# Patient Record
Sex: Female | Born: 1975 | Race: White | Hispanic: No | Smoking: Never smoker
Health system: Southern US, Community
[De-identification: ages and names within clinical notes are randomized; demographics above are authoritative.]

## PROBLEM LIST (undated history)

## (undated) ENCOUNTER — Ambulatory Visit: Payer: Medicaid Other

## (undated) ENCOUNTER — Emergency Department (HOSPITAL_COMMUNITY): Payer: Medicaid Other

## (undated) DIAGNOSIS — M1990 Unspecified osteoarthritis, unspecified site: Secondary | ICD-10-CM

## (undated) DIAGNOSIS — K760 Fatty (change of) liver, not elsewhere classified: Secondary | ICD-10-CM

## (undated) DIAGNOSIS — K219 Gastro-esophageal reflux disease without esophagitis: Secondary | ICD-10-CM

## (undated) DIAGNOSIS — I639 Cerebral infarction, unspecified: Secondary | ICD-10-CM

## (undated) DIAGNOSIS — F32A Depression, unspecified: Secondary | ICD-10-CM

## (undated) DIAGNOSIS — I2699 Other pulmonary embolism without acute cor pulmonale: Secondary | ICD-10-CM

## (undated) DIAGNOSIS — D649 Anemia, unspecified: Secondary | ICD-10-CM

## (undated) DIAGNOSIS — F419 Anxiety disorder, unspecified: Secondary | ICD-10-CM

## (undated) DIAGNOSIS — I10 Essential (primary) hypertension: Secondary | ICD-10-CM

## (undated) DIAGNOSIS — R519 Headache, unspecified: Secondary | ICD-10-CM

## (undated) DIAGNOSIS — Z87442 Personal history of urinary calculi: Secondary | ICD-10-CM

## (undated) HISTORY — PX: INCISION AND DRAINAGE: SHX5863

## (undated) HISTORY — PX: TONSILLECTOMY: SUR1361

## (undated) HISTORY — PX: CHOLECYSTECTOMY: SHX55

## (undated) SURGERY — Surgical Case
Anesthesia: *Unknown

---

## 1996-04-03 HISTORY — PX: CHOLECYSTECTOMY: SHX55

## 2009-03-11 ENCOUNTER — Emergency Department (HOSPITAL_COMMUNITY): Admission: EM | Admit: 2009-03-11 | Discharge: 2009-03-11 | Payer: Self-pay | Admitting: Emergency Medicine

## 2009-05-03 ENCOUNTER — Emergency Department (HOSPITAL_COMMUNITY): Admission: EM | Admit: 2009-05-03 | Discharge: 2009-05-03 | Payer: Self-pay | Admitting: Emergency Medicine

## 2009-07-23 ENCOUNTER — Emergency Department (HOSPITAL_COMMUNITY): Admission: EM | Admit: 2009-07-23 | Discharge: 2009-07-23 | Payer: Self-pay | Admitting: Emergency Medicine

## 2009-09-02 ENCOUNTER — Emergency Department (HOSPITAL_COMMUNITY): Admission: EM | Admit: 2009-09-02 | Discharge: 2009-09-02 | Payer: Self-pay | Admitting: Emergency Medicine

## 2009-10-28 ENCOUNTER — Emergency Department (HOSPITAL_COMMUNITY): Admission: EM | Admit: 2009-10-28 | Discharge: 2009-10-29 | Payer: Self-pay | Admitting: Emergency Medicine

## 2009-11-21 ENCOUNTER — Emergency Department (HOSPITAL_COMMUNITY): Admission: EM | Admit: 2009-11-21 | Discharge: 2009-11-21 | Payer: Self-pay | Admitting: Emergency Medicine

## 2009-12-02 ENCOUNTER — Emergency Department (HOSPITAL_COMMUNITY): Admission: EM | Admit: 2009-12-02 | Discharge: 2009-12-02 | Payer: Self-pay | Admitting: Emergency Medicine

## 2010-01-24 ENCOUNTER — Emergency Department (HOSPITAL_COMMUNITY): Admission: EM | Admit: 2010-01-24 | Discharge: 2010-01-24 | Payer: Self-pay | Admitting: Emergency Medicine

## 2010-02-13 ENCOUNTER — Emergency Department (HOSPITAL_COMMUNITY): Admission: EM | Admit: 2010-02-13 | Discharge: 2010-02-13 | Payer: Self-pay | Admitting: Emergency Medicine

## 2010-03-25 ENCOUNTER — Emergency Department (HOSPITAL_COMMUNITY)
Admission: EM | Admit: 2010-03-25 | Discharge: 2010-03-25 | Payer: Self-pay | Source: Home / Self Care | Admitting: Emergency Medicine

## 2010-06-13 LAB — DIFFERENTIAL
Basophils Absolute: 0 K/uL (ref 0.0–0.1)
Basophils Relative: 0 % (ref 0–1)
Eosinophils Absolute: 0.3 K/uL (ref 0.0–0.7)
Eosinophils Relative: 4 % (ref 0–5)
Lymphocytes Relative: 31 % (ref 12–46)
Lymphs Abs: 2.4 K/uL (ref 0.7–4.0)
Monocytes Absolute: 0.7 K/uL (ref 0.1–1.0)
Monocytes Relative: 8 % (ref 3–12)
Neutro Abs: 4.5 K/uL (ref 1.7–7.7)
Neutrophils Relative %: 57 % (ref 43–77)

## 2010-06-13 LAB — CBC
HCT: 37.8 % (ref 36.0–46.0)
MCV: 86.7 fL (ref 78.0–100.0)
RDW: 13.1 % (ref 11.5–15.5)
WBC: 7.9 10*3/uL (ref 4.0–10.5)

## 2010-06-13 LAB — BASIC METABOLIC PANEL
BUN: 10 mg/dL (ref 6–23)
Chloride: 103 mEq/L (ref 96–112)
Potassium: 3.6 mEq/L (ref 3.5–5.1)

## 2010-06-13 LAB — POCT CARDIAC MARKERS
CKMB, poc: 2.4 ng/mL (ref 1.0–8.0)
CKMB, poc: 5.2 ng/mL (ref 1.0–8.0)
Myoglobin, poc: 80.1 ng/mL (ref 12–200)
Troponin i, poc: <0.05 ng/mL (ref 0.00–0.09)
Troponin i, poc: <0.05 ng/mL (ref 0.00–0.09)

## 2010-06-13 LAB — D-DIMER, QUANTITATIVE: D-Dimer, Quant: 0.35 ug{FEU}/mL (ref 0.00–0.48)

## 2010-06-18 LAB — URINE MICROSCOPIC-ADD ON

## 2010-06-18 LAB — URINALYSIS, ROUTINE W REFLEX MICROSCOPIC
Bilirubin Urine: NEGATIVE
Glucose, UA: NEGATIVE mg/dL
Ketones, ur: NEGATIVE mg/dL
pH: 6 (ref 5.0–8.0)

## 2010-06-20 LAB — URINALYSIS, ROUTINE W REFLEX MICROSCOPIC
Bilirubin Urine: NEGATIVE
Glucose, UA: NEGATIVE mg/dL
Hgb urine dipstick: NEGATIVE
Ketones, ur: NEGATIVE mg/dL
Protein, ur: NEGATIVE mg/dL
Urobilinogen, UA: 0.2 mg/dL (ref 0.0–1.0)

## 2010-06-20 LAB — POCT I-STAT, CHEM 8
BUN: 14 mg/dL (ref 6–23)
Chloride: 104 mEq/L (ref 96–112)
HCT: 37 % (ref 36.0–46.0)
Sodium: 141 mEq/L (ref 135–145)
TCO2: 30 mmol/L (ref 0–100)

## 2010-06-20 LAB — COMPREHENSIVE METABOLIC PANEL
ALT: 20 U/L (ref 0–35)
AST: 21 U/L (ref 0–37)
Alkaline Phosphatase: 85 U/L (ref 39–117)
GFR calc Af Amer: >60 mL/min (ref >60)
Glucose, Bld: 93 mg/dL (ref 70–99)
Potassium: 3.6 mEq/L (ref 3.5–5.1)
Sodium: 139 mEq/L (ref 135–145)
Total Protein: 6.9 g/dL (ref 6.0–8.3)

## 2010-06-20 LAB — CBC
Hemoglobin: 12.6 g/dL (ref 12.0–15.0)
RBC: 4.24 MIL/uL (ref 3.87–5.11)
RDW: 14.4 % (ref 11.5–15.5)

## 2010-06-20 LAB — DIFFERENTIAL
Basophils Relative: 1 % (ref 0–1)
Eosinophils Absolute: 0.6 10*3/uL (ref 0.0–0.7)
Eosinophils Relative: 7 % — ABNORMAL HIGH (ref 0–5)
Lymphs Abs: 2.1 10*3/uL (ref 0.7–4.0)
Monocytes Absolute: 0.6 10*3/uL (ref 0.1–1.0)
Monocytes Relative: 8 % (ref 3–12)
Neutrophils Relative %: 56 % (ref 43–77)

## 2010-06-20 LAB — BRAIN NATRIURETIC PEPTIDE: Pro B Natriuretic peptide (BNP): <30 pg/mL (ref 0.0–100.0)

## 2010-06-20 LAB — POCT PREGNANCY, URINE: Preg Test, Ur: NEGATIVE

## 2013-03-19 ENCOUNTER — Emergency Department (HOSPITAL_COMMUNITY): Payer: Medicaid Other

## 2013-03-19 ENCOUNTER — Encounter (HOSPITAL_COMMUNITY): Payer: Self-pay | Admitting: Emergency Medicine

## 2013-03-19 ENCOUNTER — Emergency Department (HOSPITAL_COMMUNITY)
Admission: EM | Admit: 2013-03-19 | Discharge: 2013-03-19 | Disposition: A | Discharge status: Home / Self Care | Payer: Medicaid Other | Attending: Emergency Medicine | Admitting: Emergency Medicine

## 2013-03-19 DIAGNOSIS — N390 Urinary tract infection, site not specified: Secondary | ICD-10-CM | POA: Insufficient documentation

## 2013-03-19 DIAGNOSIS — R209 Unspecified disturbances of skin sensation: Secondary | ICD-10-CM | POA: Insufficient documentation

## 2013-03-19 DIAGNOSIS — Z9089 Acquired absence of other organs: Secondary | ICD-10-CM | POA: Insufficient documentation

## 2013-03-19 DIAGNOSIS — R1032 Left lower quadrant pain: Secondary | ICD-10-CM | POA: Insufficient documentation

## 2013-03-19 DIAGNOSIS — R197 Diarrhea, unspecified: Secondary | ICD-10-CM

## 2013-03-19 DIAGNOSIS — Z9889 Other specified postprocedural states: Secondary | ICD-10-CM | POA: Insufficient documentation

## 2013-03-19 DIAGNOSIS — I10 Essential (primary) hypertension: Secondary | ICD-10-CM | POA: Insufficient documentation

## 2013-03-19 DIAGNOSIS — Z86711 Personal history of pulmonary embolism: Secondary | ICD-10-CM | POA: Insufficient documentation

## 2013-03-19 DIAGNOSIS — R110 Nausea: Secondary | ICD-10-CM | POA: Insufficient documentation

## 2013-03-19 DIAGNOSIS — M549 Dorsalgia, unspecified: Secondary | ICD-10-CM | POA: Insufficient documentation

## 2013-03-19 DIAGNOSIS — R109 Unspecified abdominal pain: Secondary | ICD-10-CM

## 2013-03-19 HISTORY — DX: Other pulmonary embolism without acute cor pulmonale: I26.99

## 2013-03-19 HISTORY — DX: Essential (primary) hypertension: I10

## 2013-03-19 LAB — COMPREHENSIVE METABOLIC PANEL
ALT: 15 U/L (ref 0–35)
Albumin: 3.2 g/dL — ABNORMAL LOW (ref 3.5–5.2)
Alkaline Phosphatase: 94 U/L (ref 39–117)
BUN: 11 mg/dL (ref 6–23)
Calcium: 8.9 mg/dL (ref 8.4–10.5)
GFR calc Af Amer: 83 mL/min — ABNORMAL LOW (ref >90)
GFR calc non Af Amer: 72 mL/min — ABNORMAL LOW (ref >90)
Glucose, Bld: 82 mg/dL (ref 70–99)
Potassium: 3.6 mEq/L (ref 3.5–5.1)
Sodium: 138 mEq/L (ref 135–145)
Total Protein: 6.7 g/dL (ref 6.0–8.3)

## 2013-03-19 LAB — URINE MICROSCOPIC-ADD ON

## 2013-03-19 LAB — CBC WITH DIFFERENTIAL/PLATELET
Basophils Relative: 1 % (ref 0–1)
Eosinophils Absolute: 0.3 10*3/uL (ref 0.0–0.7)
Eosinophils Relative: 6 % — ABNORMAL HIGH (ref 0–5)
Hemoglobin: 12.8 g/dL (ref 12.0–15.0)
MCH: 28.4 pg (ref 26.0–34.0)
MCHC: 33.5 g/dL (ref 30.0–36.0)
MCV: 84.9 fL (ref 78.0–100.0)
Monocytes Relative: 9 % (ref 3–12)
Neutrophils Relative %: 58 % (ref 43–77)
Platelets: 259 10*3/uL (ref 150–400)

## 2013-03-19 LAB — LIPASE, BLOOD: Lipase: 24 U/L (ref 11–59)

## 2013-03-19 LAB — URINALYSIS, ROUTINE W REFLEX MICROSCOPIC
Bilirubin Urine: NEGATIVE
Ketones, ur: NEGATIVE mg/dL
Leukocytes, UA: NEGATIVE
Nitrite: POSITIVE — AB
Protein, ur: NEGATIVE mg/dL

## 2013-03-19 LAB — TROPONIN I: Troponin I: <0.3 ng/mL (ref <0.30)

## 2013-03-19 MED ORDER — SODIUM CHLORIDE 0.9 % IV BOLUS (SEPSIS)
1000.0000 mL | Freq: Once | INTRAVENOUS | Status: AC
  Administered 2013-03-19: 1000 mL via INTRAVENOUS

## 2013-03-19 MED ORDER — IOHEXOL 300 MG/ML  SOLN
50.0000 mL | Freq: Once | INTRAMUSCULAR | Status: AC | PRN
  Administered 2013-03-19: 50 mL via ORAL

## 2013-03-19 MED ORDER — SULFAMETHOXAZOLE-TRIMETHOPRIM 800-160 MG PO TABS: 1.0000 | ORAL_TABLET | Freq: Two times a day (BID) | ORAL | Status: AC

## 2013-03-19 MED ORDER — PIPERACILLIN-TAZOBACTAM 3.375 G IVPB
3.3750 g | Freq: Once | INTRAVENOUS | Status: AC
  Administered 2013-03-19: 3.375 g via INTRAVENOUS
  Filled 2013-03-19: qty 50

## 2013-03-19 MED ORDER — LORAZEPAM 2 MG/ML IJ SOLN
1.0000 mg | Freq: Once | INTRAMUSCULAR | Status: AC
  Administered 2013-03-19: 1 mg via INTRAVENOUS
  Filled 2013-03-19: qty 1

## 2013-03-19 MED ORDER — IOHEXOL 300 MG/ML  SOLN
100.0000 mL | Freq: Once | INTRAMUSCULAR | Status: AC | PRN
  Administered 2013-03-19: 100 mL via INTRAVENOUS

## 2013-03-19 MED ORDER — MORPHINE SULFATE 4 MG/ML IJ SOLN
4.0000 mg | Freq: Once | INTRAMUSCULAR | Status: AC
  Administered 2013-03-19: 4 mg via INTRAVENOUS
  Filled 2013-03-19: qty 1

## 2013-03-19 MED ORDER — ONDANSETRON HCL 4 MG/2ML IJ SOLN
4.0000 mg | Freq: Once | INTRAMUSCULAR | Status: AC
  Administered 2013-03-19: 4 mg via INTRAVENOUS
  Filled 2013-03-19: qty 2

## 2013-03-19 MED ORDER — OXYCODONE-ACETAMINOPHEN 5-325 MG PO TABS: 1.0000 | ORAL_TABLET | ORAL | Status: DC | PRN

## 2013-03-19 MED ORDER — SODIUM CHLORIDE 0.9 % IJ SOLN
INTRAMUSCULAR | Status: AC
  Filled 2013-03-19: qty 400

## 2013-03-19 MED ORDER — ONDANSETRON HCL 4 MG PO TABS: 4.0000 mg | ORAL_TABLET | Freq: Four times a day (QID) | ORAL | Status: DC

## 2013-03-19 NOTE — ED Provider Notes (Signed)
TIME SEEN: 12:55 PM  CHIEF COMPLAINT: back pain, diarrhea  HPI: Tracie Green is a 37 y.o. F with h/o HTN and prior PE who presents to the ED with complaints. She states she has had gradual onset left sided back pain that radiates to her left leg that began 1 week ago. She describes that back pain as "feeling like someone is hitting me in my back." Tracie Green reports associated numbness and tingling in her left leg. No weakness. No bowel or bladder incontinence. No urinary retention. She reports walking worsens the back pain. Tracie Green has not taken any medication for the back pain.   Tracie Green also complains of generalized stabbing abdominal pain in the left lower quadrant that has been present for one month. Tracie Green reports abdominal pain is worsened with eating. She experiences associated diarrhea and nausea. Tracie Green denies melena, bloody stools, sure your hematuria, fever, chills, vomiting. She has h/o 4 cesarean sections and cholecystectomy. Tracie Green denies sick contacts, travel, recent antibiotic use or hospitalization.  LNMP 03/12/13. Denies a history of similar symptoms.  ROS: See HPI Constitutional: no fever, no chills  Eyes: no drainage  ENT: no runny nose   Cardiovascular:  no chest pain  Resp: no SOB  GI: no vomiting, positive nausea, positive abdominal pain, positive diarrhea GU: no dysuria, no urinary incontinence Integumentary: no rash  Allergy: no hives  Musculoskeletal: no leg swelling, positive back pain, positive left shoulder and leg pain  Neurological: no slurred speech ROS otherwise negative  PAST MEDICAL HISTORY/PAST SURGICAL HISTORY:  Past Medical History  Diagnosis Date  . PE (pulmonary embolism)   . Hypertension     MEDICATIONS:  Prior to Admission medications   Not on File    ALLERGIES:  No Known Allergies  SOCIAL HISTORY:  History  Substance Use Topics  . Smoking status: Never Smoker   . Smokeless tobacco: Not on file  . Alcohol Use: No    FAMILY HISTORY: No family history on  file.  EXAM: BP 134/95  Pulse 87  Temp(Src) 98.6 F (37 C) (Oral)  Resp 18  Ht 5\' 9"  (1.753 m)  Wt 282 lb (127.914 kg)  BMI 41.63 kg/m2  SpO2 100%  LMP 03/12/2013 CONSTITUTIONAL: Alert and oriented and responds appropriately to questions. Well-appearing; well-nourished; obese HEAD: Normocephalic EYES: Conjunctivae clear, PERRL ENT: normal nose; no rhinorrhea; moist mucous membranes; pharynx without lesions noted NECK: Supple, no meningismus, no LAD  CARD: RRR; S1 and S2 appreciated; no murmurs, no clicks, no rubs, no gallops RESP: Normal chest excursion without splinting or tachypnea; breath sounds clear and equal bilaterally; no wheezes, no rhonchi, no rales,  ABD/GI: Normal bowel sounds; non-distended; no rebound, no guarding, tender to palpation in LLQ bilaterally, no peritoneal signs.  BACK:  The back appears normal, there is no CVA tenderness, diffuse lumbar tenderness, no midline tenderness and no step-off or deformity  EXT: Normal ROM in all joints; non-tender to palpation; no edema; normal capillary refill; no cyanosis    SKIN: Normal color for age and race; warm NEURO: Moves all extremities equally, 2+ patellar and achilles pulses bilaterally, 5/5 strength lower extremities bilaterally; no facial droop or slurred speech; sensation to light touch intact diffusely PSYCH: The patient's mood and manner are appropriate. Grooming and personal hygiene are appropriate.  MEDICAL DECISION MAKING: Patient here with 2 complaints. I feel her back pain is likely related to sciatica. No concern for cauda equina, epidural abscess, osteomyelitis, spinal stenosis.  Her neurologic exam is normal. No history of injury to  her back. We'll give pain medication and reassess. Discussed with patient that recommended anti-inflammatories for medical management in an outpatient followup with orthopedics if her back pain does not improve.  As for Tracie Green's LLQ pain, will obtain labs, urine, CT AP to evaluate for  colitis, diverticulitis, UTI as the source of her symptoms.  Will give IVF, morphine, zofran and reassess.  ED PROGRESS: Patient is having diffuse body pain and shortness of breath after given morphine. She states she's had morphine in the past without similar symptoms. Her EKG shows no ischemic changes. Will add on troponin. Will give Ativan and reassess. Patient's labs are unremarkable.  She reports feeling much better after Ativan. She is much more comfortable. Suspect this was an adverse reaction to morphine. Her troponin is negative. CT scan pending.  Patient has nitrite positive urinary tract infection. Cultures are pending. Will give Zosyn. CT scan still pending. Patient's had difficulty keeping contrast down due to her nausea and vomiting.     CT scan shows a small nonobstructive right renal calculus but no other significant abnormality. Have discussed with patient that have not been an emergent or life-threatening cause for her abdominal pain has been present for one month. Have recommended that she establish care with her primary care physician he may referred her to a gastroenterologist and she continues to have left-sided abdominal pain and diarrhea. Given return precautions. We'll discharge him with antibiotics for her UTI. We'll also discharge with pain medication for her back pain. Patient verbalizes understanding and is comfortable with plan.  EKG Interpretation    Date/Time:  Wednesday March 19 2013 14:06:10 EST Ventricular Rate:  79 PR Interval:  134 QRS Duration: 82 QT Interval:  386 QTC Calculation: 442 R Axis:   54 Text Interpretation:  Normal sinus rhythm Normal ECG Confirmed by Nonna Renninger  DO, Athaliah Baumbach ([id number]) on 03/19/2013 2:51:24 PM             Layla Maw Shirell Struthers, DO 03/19/13 1610

## 2013-03-19 NOTE — ED Notes (Signed)
Pt c/o pain in left lower back radiating down left leg x 1 week.  Also reports has had diarrhea "non-stop" for the past week.  C/O left sided abd pain.

## 2013-03-22 LAB — URINE CULTURE: Colony Count: >=100000

## 2013-03-23 ENCOUNTER — Telehealth (HOSPITAL_COMMUNITY): Payer: Self-pay | Admitting: Emergency Medicine

## 2013-03-23 NOTE — ED Notes (Signed)
Post ED Visit - Positive Culture Follow-up  Culture report reviewed by antimicrobial stewardship pharmacist: []  Wes Dulaney, Pharm.D., BCPS [x]  Celedonio Miyamoto, [address].D., BCPS []  Georgina Pillion, Pharm.D., BCPS []  [place], [place].D., BCPS, AAHIVP []  Estella Husk, Pharm.D., BCPS, AAHIVP  Positive urine culture Treated with Sulfa-Trimeth, organism sensitive to the same and no further patient follow-up is required at this time.  Kylie A Holland 03/23/2013, 12:25 PM

## 2013-07-16 ENCOUNTER — Emergency Department (HOSPITAL_COMMUNITY): Payer: Medicaid Other

## 2013-07-16 ENCOUNTER — Emergency Department (HOSPITAL_COMMUNITY)
Admission: EM | Admit: 2013-07-16 | Discharge: 2013-07-16 | Disposition: A | Discharge status: Home / Self Care | Payer: Medicaid Other | Attending: Emergency Medicine | Admitting: Emergency Medicine

## 2013-07-16 ENCOUNTER — Encounter (HOSPITAL_COMMUNITY): Payer: Self-pay | Admitting: Emergency Medicine

## 2013-07-16 DIAGNOSIS — Z79899 Other long term (current) drug therapy: Secondary | ICD-10-CM | POA: Diagnosis not present

## 2013-07-16 DIAGNOSIS — R296 Repeated falls: Secondary | ICD-10-CM | POA: Diagnosis not present

## 2013-07-16 DIAGNOSIS — S99919A Unspecified injury of unspecified ankle, initial encounter: Secondary | ICD-10-CM | POA: Diagnosis present

## 2013-07-16 DIAGNOSIS — S86819A Strain of other muscle(s) and tendon(s) at lower leg level, unspecified leg, initial encounter: Principal | ICD-10-CM

## 2013-07-16 DIAGNOSIS — Z86711 Personal history of pulmonary embolism: Secondary | ICD-10-CM | POA: Diagnosis not present

## 2013-07-16 DIAGNOSIS — S86912A Strain of unspecified muscle(s) and tendon(s) at lower leg level, left leg, initial encounter: Secondary | ICD-10-CM

## 2013-07-16 DIAGNOSIS — S838X9A Sprain of other specified parts of unspecified knee, initial encounter: Secondary | ICD-10-CM | POA: Insufficient documentation

## 2013-07-16 DIAGNOSIS — S93402A Sprain of unspecified ligament of left ankle, initial encounter: Secondary | ICD-10-CM

## 2013-07-16 DIAGNOSIS — S93409A Sprain of unspecified ligament of unspecified ankle, initial encounter: Secondary | ICD-10-CM | POA: Insufficient documentation

## 2013-07-16 DIAGNOSIS — I10 Essential (primary) hypertension: Secondary | ICD-10-CM | POA: Diagnosis not present

## 2013-07-16 DIAGNOSIS — Y929 Unspecified place or not applicable: Secondary | ICD-10-CM | POA: Insufficient documentation

## 2013-07-16 DIAGNOSIS — S8990XA Unspecified injury of unspecified lower leg, initial encounter: Secondary | ICD-10-CM | POA: Diagnosis present

## 2013-07-16 DIAGNOSIS — Y939 Activity, unspecified: Secondary | ICD-10-CM | POA: Insufficient documentation

## 2013-07-16 MED ORDER — IBUPROFEN 800 MG PO TABS: 800.0000 mg | ORAL_TABLET | Freq: Three times a day (TID) | ORAL | Status: DC

## 2013-07-16 MED ORDER — TRAMADOL HCL 50 MG PO TABS: ORAL_TABLET | ORAL | Status: DC

## 2013-07-16 MED ORDER — TRAMADOL HCL 50 MG PO TABS
100.0000 mg | ORAL_TABLET | Freq: Once | ORAL | Status: AC
  Administered 2013-07-16: 100 mg via ORAL
  Filled 2013-07-16: qty 2

## 2013-07-16 MED ORDER — KETOROLAC TROMETHAMINE 10 MG PO TABS
10.0000 mg | ORAL_TABLET | Freq: Once | ORAL | Status: AC
  Administered 2013-07-16: 10 mg via ORAL
  Filled 2013-07-16: qty 1

## 2013-07-16 MED ORDER — ONDANSETRON HCL 4 MG PO TABS
4.0000 mg | ORAL_TABLET | Freq: Once | ORAL | Status: AC
  Administered 2013-07-16: 4 mg via ORAL
  Filled 2013-07-16: qty 1

## 2013-07-16 NOTE — Discharge Instructions (Signed)
Your x-ray is negative for fracture or dislocation. Please apply ice to your knee and ankle. Please use medications as directed. Please see Dr. Hilda LiasKeeling, or the orthopedic specialist of your choice for evaluation of your knee, and the reason for recurrent falls. Please use the crutches until you can safely put weight on the left side.  Knee Sprain A knee sprain is a tear in the strong bands of tissue that connect the bones (ligaments) of your knee. HOME CARE  Raise (elevate) your injured knee to lessen puffiness (swelling).  To ease pain and puffiness, put ice on the injured area.  Put ice in a plastic bag.  Place a towel between your skin and the bag.  Leave the ice on for 20 minutes, 2 3 times a day.  Only take medicine as told by your doctor.  Do not leave your knee unprotected until pain and stiffness go away (usually 4 6 weeks).  If you have a cast or splint, do not get it wet. If your doctor told you to not take it off, cover it with a plastic bag when you shower or bathe. Do not swim.  Your doctor may have you do exercises to prevent or limit permanent weakness and stiffness. GET HELP RIGHT AWAY IF:   Your cast or splint becomes damaged.  Your pain gets worse.  You have a lot of pain, puffiness, or numbness below the cast or splint. MAKE SURE YOU:   Understand these instructions.  Will watch your condition.  Will get help right away if you are not doing well or get worse. Document Released: 03/08/2009 Document Revised: 01/08/2013 Document Reviewed: 11/26/2012 [facility] Patient Information 2014 [place], [place].  Ankle Sprain An ankle sprain is an injury to the strong, fibrous tissues (ligaments) that hold your ankle bones together.  HOME CARE   Put ice on your ankle for 1 2 days or as told by your doctor.  Put ice in a plastic bag.  Place a towel between your skin and the bag.  Leave the ice on for 15-20 minutes at a time, every 2 hours while you are  awake.  Only take medicine as told by your doctor.  Raise (elevate) your injured ankle above the level of your heart as much as possible for 2 3 days.  Use crutches if your doctor tells you to. Slowly put your own weight on the affected ankle. Use the crutches until you can walk without pain.  If you have a plaster splint:  Do not rest it on anything harder than a pillow for 24 hours.  Do not put weight on it.  Do not get it wet.  Take it off to shower or bathe.  If given, use an elastic wrap or support stocking for support. Take the wrap off if your toes lose feeling (numb), tingle, or turn cold or blue.  If you have an air splint:  Add or let out air to make it comfortable.  Take it off at night and to shower and bathe.  Wiggle your toes and move your ankle up and down often while you are wearing it. GET HELP RIGHT AWAY IF:   Your toes lose feeling (numb) or turn blue.  You have severe pain that is increasing.  You have rapidly increasing bruising or puffiness (swelling).  Your toes feel very cold.  You lose feeling in your foot.  Your medicine does not help your pain. MAKE SURE YOU:   Understand these instructions.  Will  watch your condition.  Will get help right away if you are not doing well or get worse. Document Released: 09/06/2007 Document Revised: 12/13/2011 Document Reviewed: 10/02/2011 [facility] Patient Information 2014 [place], [place].

## 2013-07-16 NOTE — ED Notes (Signed)
Pt states lt leg "gave out on me" last night and she fell backward on top of lt leg. Co lt knee/leg pain, able to bear weight but states it is painful.

## 2013-07-16 NOTE — ED Provider Notes (Signed)
CSN: [id number]     Arrival date & time 07/16/13  [id number] History   First MD Initiated Contact with Patient 07/16/13 1034     Chief Complaint  Patient presents with  . Fall     (Consider location/radiation/quality/duration/timing/severity/associated sxs/prior Treatment) HPI Comments: Pt states she sustained a fall about 2 weeks ago, but did not go to see MD or ED. She fell again on last night and injured the left knee and leg. She can bear weight, but with discomfort. She was told to have knee surgery after an accident 2002, but has not had anything done about the knee.  Patient is a 38 y.o. female presenting with fall. The history is provided by the patient.  Fall Associated symptoms include arthralgias. Pertinent negatives include no abdominal pain, chest pain, coughing or neck pain.    Past Medical History  Diagnosis Date  . PE (pulmonary embolism)   . Hypertension    Past Surgical History  Procedure Laterality Date  . Incision and drainage    . Cholecystectomy    . Cesarean section     History reviewed. No pertinent family history. History  Substance Use Topics  . Smoking status: Never Smoker   . Smokeless tobacco: Not on file  . Alcohol Use: No   OB History   Grav Para Term Preterm Abortions TAB SAB Ect Mult Living                 Review of Systems  Constitutional: Negative for activity change.       All ROS Neg except as noted in HPI  HENT: Negative for nosebleeds.   Eyes: Negative for photophobia and discharge.  Respiratory: Negative for cough, shortness of breath and wheezing.   Cardiovascular: Negative for chest pain and palpitations.  Gastrointestinal: Negative for abdominal pain and blood in stool.  Genitourinary: Negative for dysuria, frequency and hematuria.  Musculoskeletal: Positive for arthralgias. Negative for back pain and neck pain.  Skin: Negative.   Neurological: Negative for dizziness, seizures and speech difficulty.  Psychiatric/Behavioral:  Negative for hallucinations and confusion.      Allergies  Review of patient's allergies indicates no known allergies.  Home Medications   Prior to Admission medications   Medication Sig Start Date End Date Taking? Authorizing Provider  escitalopram (LEXAPRO) 5 MG tablet Take 5 mg by mouth daily.   Yes Historical Provider, MD  lisinopril-hydrochlorothiazide (PRINZIDE,ZESTORETIC) 20-12.5 MG per tablet Take 1 tablet by mouth daily.   Yes Historical Provider, MD   BP 136/66  Pulse 75  Temp(Src) 98.3 F (36.8 C) (Oral)  Resp 20  Ht 5\' 9"  (1.753 m)  Wt 292 lb (132.45 kg)  BMI 43.10 kg/m2  LMP 06/26/2013 Physical Exam  Nursing note and vitals reviewed. Constitutional: She is oriented to person, place, and time. She appears well-developed and well-nourished.  Non-toxic appearance.  HENT:  Head: Normocephalic.  Right Ear: Tympanic membrane and external ear normal.  Left Ear: Tympanic membrane and external ear normal.  Eyes: EOM and lids are normal. Pupils are equal, round, and reactive to light.  Neck: Normal range of motion. Neck supple. Carotid bruit is not present.  Cardiovascular: Normal rate, regular rhythm, normal heart sounds, intact distal pulses and normal pulses.   Pulmonary/Chest: Breath sounds normal. No respiratory distress.  Abdominal: Soft. Bowel sounds are normal. There is no tenderness. There is no guarding.  Musculoskeletal:       Left knee: She exhibits decreased range of motion. She exhibits no swelling,  no effusion and no erythema. Tenderness found.  Crepitus with ROM.  Lymphadenopathy:       Head (right side): No submandibular adenopathy present.       Head (left side): No submandibular adenopathy present.    She has no cervical adenopathy.  Neurological: She is alert and oriented to person, place, and time. She has normal strength. No cranial nerve deficit or sensory deficit.  Skin: Skin is warm and dry.  Psychiatric: She has a normal mood and affect. Her  speech is normal.    ED Course  Procedures (including critical care time) Labs Review Labs Reviewed - No data to display  Imaging Review No results found.   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 No effusion noted. No fx or dislocation. Rx for ibuprofen and ultram. Crutches and aso splint given.   Final diagnoses:  None    *I have reviewed nursing notes, vital signs, and all appropriate lab and imaging results for this patient.Kathie Dike**    Austynn Pridmore M Aaniya Sterba, PA-C 07/17/13 [phone]

## 2013-07-21 NOTE — ED Provider Notes (Signed)
Medical screening examination/treatment/procedure(s) were performed by non-physician practitioner and as supervising physician I was immediately available for consultation/collaboration.   EKG Interpretation None       Tracie HutchingBrian Reynard Christoffersen, MD 07/21/13 [id number]

## 2014-01-22 ENCOUNTER — Encounter (HOSPITAL_COMMUNITY): Payer: Self-pay | Admitting: Emergency Medicine

## 2014-01-22 ENCOUNTER — Emergency Department (HOSPITAL_COMMUNITY)
Admission: EM | Admit: 2014-01-22 | Discharge: 2014-01-22 | Disposition: A | Discharge status: Home / Self Care | Payer: Medicaid Other | Attending: Emergency Medicine | Admitting: Emergency Medicine

## 2014-01-22 ENCOUNTER — Emergency Department (HOSPITAL_COMMUNITY): Payer: Medicaid Other

## 2014-01-22 DIAGNOSIS — Z79899 Other long term (current) drug therapy: Secondary | ICD-10-CM | POA: Insufficient documentation

## 2014-01-22 DIAGNOSIS — S8992XA Unspecified injury of left lower leg, initial encounter: Secondary | ICD-10-CM | POA: Diagnosis present

## 2014-01-22 DIAGNOSIS — Y9389 Activity, other specified: Secondary | ICD-10-CM | POA: Insufficient documentation

## 2014-01-22 DIAGNOSIS — Y9289 Other specified places as the place of occurrence of the external cause: Secondary | ICD-10-CM | POA: Insufficient documentation

## 2014-01-22 DIAGNOSIS — S90511A Abrasion, right ankle, initial encounter: Secondary | ICD-10-CM | POA: Insufficient documentation

## 2014-01-22 DIAGNOSIS — I10 Essential (primary) hypertension: Secondary | ICD-10-CM | POA: Diagnosis not present

## 2014-01-22 DIAGNOSIS — Z23 Encounter for immunization: Secondary | ICD-10-CM | POA: Diagnosis not present

## 2014-01-22 DIAGNOSIS — S50311A Abrasion of right elbow, initial encounter: Secondary | ICD-10-CM | POA: Diagnosis not present

## 2014-01-22 DIAGNOSIS — S82892A Other fracture of left lower leg, initial encounter for closed fracture: Secondary | ICD-10-CM

## 2014-01-22 DIAGNOSIS — W010XXA Fall on same level from slipping, tripping and stumbling without subsequent striking against object, initial encounter: Secondary | ICD-10-CM | POA: Diagnosis not present

## 2014-01-22 DIAGNOSIS — S82435A Nondisplaced oblique fracture of shaft of left fibula, initial encounter for closed fracture: Secondary | ICD-10-CM | POA: Insufficient documentation

## 2014-01-22 DIAGNOSIS — Z86711 Personal history of pulmonary embolism: Secondary | ICD-10-CM | POA: Insufficient documentation

## 2014-01-22 MED ORDER — OXYCODONE-ACETAMINOPHEN 5-325 MG PO TABS
2.0000 | ORAL_TABLET | Freq: Once | ORAL | Status: AC
  Administered 2014-01-22: 2 via ORAL
  Filled 2014-01-22: qty 2

## 2014-01-22 MED ORDER — TETANUS-DIPHTH-ACELL PERTUSSIS 5-2.5-18.5 LF-MCG/0.5 IM SUSP
0.5000 mL | Freq: Once | INTRAMUSCULAR | Status: AC
  Administered 2014-01-22: 0.5 mL via INTRAMUSCULAR
  Filled 2014-01-22: qty 0.5

## 2014-01-22 MED ORDER — OXYCODONE-ACETAMINOPHEN 7.5-325 MG PO TABS: 1.0000 | ORAL_TABLET | Freq: Four times a day (QID) | ORAL | Status: DC | PRN

## 2014-01-22 MED ORDER — NAPROXEN 500 MG PO TABS: 500.0000 mg | ORAL_TABLET | Freq: Two times a day (BID) | ORAL | Status: DC

## 2014-01-22 MED ORDER — IBUPROFEN 800 MG PO TABS
800.0000 mg | ORAL_TABLET | Freq: Once | ORAL | Status: AC
  Administered 2014-01-22: 800 mg via ORAL
  Filled 2014-01-22: qty 1

## 2014-01-22 NOTE — ED Notes (Signed)
Pt escorted to bathroom via w/c.  After returning, pt tearful, c/o worsening pain.  PA notified and at bedside.

## 2014-01-22 NOTE — ED Provider Notes (Signed)
Medical screening examination/treatment/procedure(s) were performed by non-physician practitioner and as supervising physician I was immediately available for consultation/collaboration.   EKG Interpretation None        Arabella Revelle L Anaiza Behrens, MD 01/22/14 1531 

## 2014-01-22 NOTE — ED Provider Notes (Signed)
CSN: [id number]     Arrival date & time 01/22/14  1032 History   First MD Initiated Contact with Patient 01/22/14 1119     Chief Complaint  Patient presents with  . Leg Pain     (Consider location/radiation/quality/duration/timing/severity/associated sxs/prior Treatment) HPI   Gatha MayerMolly Vanloan is a 38 y.o. female who presents to the Emergency Department complaining of pain to her left lower leg, foot and ankle after a fall this morning.  She states that she slipped on a wet floor and landed on her left leg.  She c/o severe pain to the ankle that radiates to her lower leg.  She also c/o abrasion to the right elbow and right ankle, but reports the pain has "soreness."  She reports the leg and ankle pain is worse with movement or weight bearing.  She denies neck or back pain, head injury, LOC, numbness or weakness.  She has not tried any therapies prior to Ed arrival.    Past Medical History  Diagnosis Date  . PE (pulmonary embolism)   . Hypertension    Past Surgical History  Procedure Laterality Date  . Incision and drainage    . Cholecystectomy    . Cesarean section     No family history on file. History  Substance Use Topics  . Smoking status: Never Smoker   . Smokeless tobacco: Not on file  . Alcohol Use: No   OB History   Grav Para Term Preterm Abortions TAB SAB Ect Mult Living                 Review of Systems  Constitutional: Negative for fever and chills.  Gastrointestinal: Negative for nausea and vomiting.  Genitourinary: Negative for dysuria and difficulty urinating.  Musculoskeletal: Positive for arthralgias and joint swelling. Negative for neck pain and neck stiffness.  Skin: Negative for color change and wound.       Abrasions to right ankle and right elbow  Neurological: Negative for dizziness, syncope, weakness, numbness and headaches.  Psychiatric/Behavioral: Negative for confusion.  All other systems reviewed and are negative.     Allergies  Review of  patient's allergies indicates no known allergies.  Home Medications   Prior to Admission medications   Medication Sig Start Date End Date Taking? Authorizing Provider  amLODipine (NORVASC) 5 MG tablet Take 5 mg by mouth daily.   Yes Historical Provider, MD  HYDROcodone-acetaminophen (NORCO) 7.5-325 MG per tablet Take 1 tablet by mouth 3 (three) times daily as needed for moderate pain.   Yes Historical Provider, MD  hydrOXYzine (ATARAX/VISTARIL) 25 MG tablet Take 25 mg by mouth 3 (three) times daily as needed for anxiety.   Yes Historical Provider, MD  omeprazole (PRILOSEC) 40 MG capsule Take 40 mg by mouth daily.   Yes Historical Provider, MD   BP 131/112  Pulse 88  Temp(Src) 98.2 F (36.8 C) (Oral)  Resp 20  Ht 5\' 9"  (1.753 m)  Wt 292 lb (132.45 kg)  BMI 43.10 kg/m2  SpO2 100%  LMP 01/01/2014 Physical Exam  Nursing note and vitals reviewed. Constitutional: She is oriented to person, place, and time. She appears well-developed and well-nourished. No distress.  Pt is morbidly obese  HENT:  Head: Normocephalic and atraumatic.  Cardiovascular: Normal rate, regular rhythm, normal heart sounds and intact distal pulses.   No murmur heard. Pulmonary/Chest: Effort normal and breath sounds normal. No respiratory distress.  Musculoskeletal: She exhibits edema and tenderness.  Diffuse ttp of the medial and lateral left  ankle, distal and lateral left foot and distal lower leg.  Mild STS of the ankle.  No bony deformity.  Distal sensation intact.  DP pulse brisk.  Pt has full ROM of the right ankle and right elbow, no edema  Neurological: She is alert and oriented to person, place, and time. She exhibits normal muscle tone. Coordination normal.  Skin:  superficial abrasions to the right lateral ankle and elbow.  No bleeding    ED Course  Procedures (including critical care time) Labs Review Labs Reviewed - No data to display  Imaging Review Dg Tibia/fibula Left  01/22/2014   CLINICAL  DATA:  Foot and lower leg pain after falling on a kitchen floor. Initial encounter.  EXAM: LEFT TIBIA AND FIBULA - 2 VIEW  COMPARISON:  None.  FINDINGS: Nondisplaced oblique fracture of the distal fibula.  IMPRESSION: 1. Nondisplaced oblique fracture of the distal fibular metadiaphysis. Dedicated radiography of the ankle is recommended.   Electronically Signed   By: Herbie BaltimoreWalt  Liebkemann M.D.   On: 01/22/2014 12:25   Dg Ankle Complete Left  01/22/2014   CLINICAL DATA:  Acute distal metadiaphyseal nondisplaced fibular fracture.  EXAM: LEFT ANKLE COMPLETE - 3+ VIEW  COMPARISON:  01/22/2014  FINDINGS: Oblique or spiral distal fibular metadiaphyseal fracture appears nondisplaced. Soft tissue swelling both medially and laterally. No definite widening of the tibiotalar space.  Accessory navicular. Small plantar and Achilles calcaneal spurs. Plafond and talar dome appear intact.  IMPRESSION: 1. Weber B fracture of the distal fibula. Soft tissue swelling medially could indicate concomitant deltoid ligament injury but we do not show abnormal widening of the medial tibiotalar space. 2. Plantar and Achilles calcaneal spurs.   Electronically Signed   By: Herbie BaltimoreWalt  Liebkemann M.D.   On: 01/22/2014 12:59   Dg Foot Complete Left  01/22/2014   CLINICAL DATA:  Left lateral foot pain. Fall today with foot inversion. Popping sensation at the time of injury. Initial encounter.  EXAM: LEFT FOOT - COMPLETE 3+ VIEW  COMPARISON:  10/17/2013  FINDINGS: Accessory navicular. No malalignment at the Lisfranc joint. Metatarsals and phalanges intact. Small plantar and Achilles calcaneal spurs. Mild dorsal midfoot spurring. Soft tissue swelling along the dorsum of the foot.  IMPRESSION: 1. No acute bony findings in the forefoot. 2. Dorsal midfoot degenerative spurring. 3. Small plantar and Achilles calcaneal spurs. 4. Dorsal soft tissue swelling along the metatarsals, although less striking than on 10/17/2013.   Electronically Signed   By: Herbie BaltimoreWalt   Liebkemann M.D.   On: 01/22/2014 12:24     EKG Interpretation None      MDM   Final diagnoses:  Ankle fracture, left, closed, initial encounter    Abrasions were cleaned and bandaged, TdaP given.  Pt with a fibula fx.  Stirrup and posterior splints applied.  NV intact.  Pain improved after splint application.  Pt advised to elevate, ice and close f/u with Dr. Romeo AppleHarrison.  Rx for percocet, naprosyn and HD crutches.  Pt agrees to plan and appears stable for d/c    Nik Gorrell L. Trisha Mangleriplett, PA-C 01/22/14 1342

## 2014-01-22 NOTE — Discharge Instructions (Signed)
Ankle Fracture °A fracture is a break in a bone. A cast or splint may be used to protect the ankle and heal the break. Sometimes, surgery is needed. °HOME CARE °· Use crutches as told by your doctor. It is very important that you use your crutches correctly. °· Do not put weight or pressure on the injured ankle until told by your doctor. °· Keep your ankle raised (elevated) when sitting or lying down. °· Apply ice to the ankle: °¨ Put ice in a plastic bag. °¨ Place a towel between your cast and the bag. °¨ Leave the ice on for 20 minutes, 2-3 times a day. °· If you have a plaster or fiberglass cast: °¨ Do not try to scratch under the cast with any objects. °¨ Check the skin around the cast every day. You may put lotion on red or sore areas. °¨ Keep your cast dry and clean. °· If you have a plaster splint: °¨ Wear the splint as told by your doctor. °¨ You can loosen the elastic around the splint if your toes get numb, tingle, or turn cold or blue. °· Do not put pressure on any part of your cast or splint. It may break. Rest your plaster splint or cast only on a pillow the first 24 hours until it is fully hardened. °· Cover your cast or splint with a plastic bag during showers. °· Do not lower your cast or splint into water. °· Take medicine as told by your doctor. °· Do not drive until your doctor says it is safe. °· Follow-up with your doctor as told. It is very important that you go to your follow-up visits. °GET HELP IF: °The swelling and discomfort gets worse.  °GET HELP RIGHT AWAY IF:  °· Your splint or cast breaks. °· You continue to have very bad pain. °· You have new pain or swelling after your splint or cast was put on. °· Your skin or toes below the injured ankle: °¨ Turn blue or gray. °¨ Feel cold, numb, or you cannot feel them. °· There is a bad smell or yellowish white fluid (pus) coming from under the splint or cast. °MAKE SURE YOU:  °· Understand these instructions. °· Will watch your  condition. °· Will get help right away if you are not doing well or get worse. °Document Released: 01/15/2009 Document Revised: 01/08/2013 Document Reviewed: 10/17/2012 °Exit[employer]® Patient Information ©2015 [employer]. This information is not intended to replace advice given to you by your health care provider. Make sure you discuss any questions you have with your health care provider. ° °

## 2014-01-22 NOTE — ED Notes (Signed)
Pt slipped on wet floor, landing on back.  C/o pain to left knee down to left foot.

## 2014-01-26 ENCOUNTER — Telehealth: Payer: Self-pay | Admitting: Orthopedic Surgery

## 2014-01-26 NOTE — Telephone Encounter (Signed)
Patient called to relay that she had been seen at [facility] 01/22/14, for problem of ankle/foot injury; fracture of ankle is noted in chart. She states she is in brace. Offered appointment upon receipt of referral from primary care physician, per her insurance requirement; patient will contact her physician, Dr. Lillie ColumbiaPhillip Lands, [place], where she primarily resides; states husband is in [place].  Patient's ph# is [phone].  Appointment pending.

## 2014-01-26 NOTE — Telephone Encounter (Signed)
Patient's primary care physician's office called in response to patient's request to be seen here following Emergency Room visit; states they are seeing her tomorrow, 01/27/14, to be evaluated first.  Patient aware of status.

## 2014-01-29 ENCOUNTER — Encounter: Payer: Self-pay | Admitting: Orthopedic Surgery

## 2014-01-29 ENCOUNTER — Ambulatory Visit (INDEPENDENT_AMBULATORY_CARE_PROVIDER_SITE_OTHER): Payer: Medicaid Other | Admitting: Orthopedic Surgery

## 2014-01-29 VITALS — BP 128/64 | Ht 69.0 in | Wt 292.0 lb

## 2014-01-29 DIAGNOSIS — S82892A Other fracture of left lower leg, initial encounter for closed fracture: Secondary | ICD-10-CM

## 2014-01-29 DIAGNOSIS — S82402A Unspecified fracture of shaft of left fibula, initial encounter for closed fracture: Secondary | ICD-10-CM

## 2014-01-29 DIAGNOSIS — S82409A Unspecified fracture of shaft of unspecified fibula, initial encounter for closed fracture: Secondary | ICD-10-CM | POA: Insufficient documentation

## 2014-01-29 MED ORDER — OXYCODONE-ACETAMINOPHEN 7.5-325 MG PO TABS
1.0000 | ORAL_TABLET | ORAL | Status: DC | PRN
Start: 2014-01-29 — End: 2021-06-07

## 2014-01-29 NOTE — Telephone Encounter (Signed)
Referral had been received 01/28/14. Patient has been scheduled for 01/29/14, aware.

## 2014-01-29 NOTE — Progress Notes (Signed)
Patient ID: Tracie Green, female   DOB: March 20, 1976, 38 y.o.   MRN: [id number] Patient ID: Tracie Green, female   DOB: March 20, 1976, 38 y.o.   MRN: [id number]  Chief Complaint  Patient presents with  . Ankle Injury    Left ankle fracture, DOI 01/22/14     Tracie Green is a 38 y.o. female.   HPI  38 year old female fell in her kitchen October 22 injured her left ankle sustained a fibular fracture nondisplaced. ER visit she was placed in a sugar tong splint. She complains of throbbing stabbing left ankle pain with swelling and some numbness. She also complains of knee pain with a history of "ligament tears" no surgery for that. Pain 9 out of 10 but she takes hydrocodone for back pain on a chronic basis. Pain is increased with walking and pressure relieved with ice and elevation oxycodone 7.5 and naproxen 500.  Past Medical History  Diagnosis Date  . PE (pulmonary embolism)   . Hypertension     Past Surgical History  Procedure Laterality Date  . Incision and drainage    . Cholecystectomy    . Cesarean section      History reviewed. No pertinent family history.  Social History History  Substance Use Topics  . Smoking status: Never Smoker   . Smokeless tobacco: Not on file  . Alcohol Use: No    No Known Allergies  Current Outpatient Prescriptions  Medication Sig Dispense Refill  . amLODipine (NORVASC) 5 MG tablet Take 5 mg by mouth daily.      . hydrOXYzine (ATARAX/VISTARIL) 25 MG tablet Take 25 mg by mouth 3 (three) times daily as needed for anxiety.      Marland Kitchen. lisinopril-hydrochlorothiazide (PRINZIDE,ZESTORETIC) 20-25 MG per tablet Take 1 tablet by mouth daily.      . naproxen (NAPROSYN) 500 MG tablet Take 1 tablet (500 mg total) by mouth 2 (two) times daily with a meal.  20 tablet  0  . omeprazole (PRILOSEC) 40 MG capsule Take 40 mg by mouth daily.      Marland Kitchen. HYDROcodone-acetaminophen (NORCO) 7.5-325 MG per tablet Take 1 tablet by mouth 3 (three) times daily as needed for moderate pain.       Marland Kitchen. oxyCODONE-acetaminophen (PERCOCET) 7.5-325 MG per tablet Take 1 tablet by mouth every 4 (four) hours as needed for pain.  42 tablet  0   No current facility-administered medications for this visit.    Review of Systems Review of Systems  Constitutional:       Weight gain night sweats  HENT:       Sinus situs  Eyes: Positive for visual disturbance.  Cardiovascular: Positive for leg swelling.  Musculoskeletal: Positive for back pain.  Neurological: Positive for light-headedness and numbness.  All other systems reviewed and are negative.   Blood pressure 128/64, height 5\' 9"  (1.753 m), weight 292 lb (132.45 kg), last menstrual period 01/01/2014.  Physical Exam Physical Exam  Vitals reviewed. Constitutional: She is oriented to person, place, and time. She appears well-developed and well-nourished.  Musculoskeletal:       Right ankle: Normal.       Left ankle: She exhibits decreased range of motion, swelling and ecchymosis. She exhibits no deformity, no laceration and normal pulse. Tenderness. Lateral malleolus tenderness found. Achilles tendon exhibits no pain and no defect.       Feet:  Painful antalgic weightbearing.  Neurological: She is alert and oriented to person, place, and time. She has normal reflexes.  Psychiatric: She has  a normal mood and affect. Her behavior is normal. Judgment and thought content normal.    Data Reviewed Hospital images show a nondisplaced fibular fracture with intact mortise by my interpretation  Assessment    Encounter Diagnoses  Name Primary?  Marland Kitchen. Ankle fracture, left   . Fibula fracture, left, closed, initial encounter Yes       Plan    Weightbearing as tolerated in a cam walker Meds ordered this encounter  Medications  . oxyCODONE-acetaminophen (PERCOCET) 7.5-325 MG per tablet    Sig: Take 1 tablet by mouth every 4 (four) hours as needed for pain.    Dispense:  42 tablet    Refill:  0  . lisinopril-hydrochlorothiazide  (PRINZIDE,ZESTORETIC) 20-25 MG per tablet    Sig: Take 1 tablet by mouth daily.          Fuller CanadaStanley Harrison 01/29/2014, 2:29 PM

## 2014-01-29 NOTE — Patient Instructions (Addendum)
Weight bearing as tolerated in boot 

## 2014-02-05 ENCOUNTER — Ambulatory Visit: Payer: Medicaid Other | Admitting: Orthopedic Surgery

## 2014-03-12 ENCOUNTER — Ambulatory Visit (INDEPENDENT_AMBULATORY_CARE_PROVIDER_SITE_OTHER): Payer: Medicaid Other

## 2014-03-12 ENCOUNTER — Encounter: Payer: Self-pay | Admitting: Orthopedic Surgery

## 2014-03-12 ENCOUNTER — Ambulatory Visit (INDEPENDENT_AMBULATORY_CARE_PROVIDER_SITE_OTHER): Payer: Self-pay | Admitting: Orthopedic Surgery

## 2014-03-12 VITALS — BP 135/81 | Ht 69.0 in | Wt 292.0 lb

## 2014-03-12 DIAGNOSIS — S82892A Other fracture of left lower leg, initial encounter for closed fracture: Secondary | ICD-10-CM

## 2014-03-12 DIAGNOSIS — M25572 Pain in left ankle and joints of left foot: Secondary | ICD-10-CM

## 2014-03-12 DIAGNOSIS — S82402A Unspecified fracture of shaft of left fibula, initial encounter for closed fracture: Secondary | ICD-10-CM

## 2014-03-12 MED ORDER — HYDROCODONE-ACETAMINOPHEN 7.5-325 MG PO TABS: 1.0000 | ORAL_TABLET | Freq: Four times a day (QID) | ORAL | Status: DC | PRN

## 2014-03-12 NOTE — Progress Notes (Signed)
Patient ID: Tracie Green, female   DOB: [birth year], 38 y.o.   MRN: [id number]  Chief Complaint  Patient presents with  . Follow-up    6 week follow up + xray left ankle fx, DOI 01/22/14    HPI Tracie Green is a 38 y.o. female.  She has a left fibular fracture comes in for repeat x-ray and we've noticed that she is complaining of pain in her second digit  Review of Systems Review of Systems 1. Foot pain  No Known Allergies  Current Outpatient Prescriptions  Medication Sig Dispense Refill  . amLODipine (NORVASC) 5 MG tablet Take 5 mg by mouth daily.    Marland Kitchen. gabapentin (NEURONTIN) 100 MG capsule Take 100 mg by mouth 3 (three) times daily.    Marland Kitchen. HYDROcodone-acetaminophen (NORCO) 7.5-325 MG per tablet Take 1 tablet by mouth every 6 (six) hours as needed for moderate pain. 120 tablet 0  . hydrOXYzine (ATARAX/VISTARIL) 25 MG tablet Take 25 mg by mouth 3 (three) times daily as needed for anxiety.    Marland Kitchen. lisinopril-hydrochlorothiazide (PRINZIDE,ZESTORETIC) 20-25 MG per tablet Take 1 tablet by mouth daily.    Marland Kitchen. omeprazole (PRILOSEC) 40 MG capsule Take 40 mg by mouth daily.    . naproxen (NAPROSYN) 500 MG tablet Take 1 tablet (500 mg total) by mouth 2 (two) times daily with a meal. (Patient not taking: Reported on 03/12/2014) 20 tablet 0  . oxyCODONE-acetaminophen (PERCOCET) 7.5-325 MG per tablet Take 1 tablet by mouth every 4 (four) hours as needed for pain. (Patient not taking: Reported on 03/12/2014) 42 tablet 0   No current facility-administered medications for this visit.      Physical Exam Physical Exam Blood pressure 135/81, height 5\' 9"  (1.753 m), weight 292 lb (132.45 kg), last menstrual period 03/03/2014.   The patient is alert and oriented person place and time  Exam of the second digit left foot  Inspection hammertoe is noted painful MTP joint appears chronic, also tender in her fracture site with stiffness of the ankle joint Skin: Normal skin 2+ distal pulses   X-rays of the  ankle show healing nondisplaced fibular fracture with callus formation   Data Reviewed X-ray interpretation independent: Fibula fracture with callus  Assessment    Encounter Diagnoses  Name Primary?  Marland Kitchen. Ankle fracture, left Yes  . Fibula fracture, left, closed, initial encounter   . Pain in joint, ankle and foot, left         Plan    X-ray ankle and foot when she comes back;  Cam Walker for another 5 weeks       Fuller CanadaStanley Maximilliano Kersh 03/12/2014, 4:10 PM

## 2014-04-16 ENCOUNTER — Ambulatory Visit (INDEPENDENT_AMBULATORY_CARE_PROVIDER_SITE_OTHER): Payer: Medicaid Other

## 2014-04-16 ENCOUNTER — Ambulatory Visit (INDEPENDENT_AMBULATORY_CARE_PROVIDER_SITE_OTHER): Payer: Self-pay | Admitting: Orthopedic Surgery

## 2014-04-16 ENCOUNTER — Encounter: Payer: Self-pay | Admitting: Orthopedic Surgery

## 2014-04-16 VITALS — BP 128/82 | Ht 69.0 in | Wt 292.0 lb

## 2014-04-16 DIAGNOSIS — S82892A Other fracture of left lower leg, initial encounter for closed fracture: Secondary | ICD-10-CM

## 2014-04-16 DIAGNOSIS — M25572 Pain in left ankle and joints of left foot: Secondary | ICD-10-CM

## 2014-04-16 MED ORDER — HYDROCODONE-ACETAMINOPHEN 7.5-325 MG PO TABS
1.0000 | ORAL_TABLET | Freq: Three times a day (TID) | ORAL | Status: DC
Start: 2014-04-16 — End: 2021-12-23

## 2014-04-16 NOTE — Patient Instructions (Signed)
Remove boot 

## 2014-04-16 NOTE — Progress Notes (Signed)
Patient ID: Gatha MayerMolly Green, female   DOB: [birth year], 39 y.o.   MRN: [id number] Chief Complaint  Patient presents with  . Follow-up    5 week recheck and xray left foot, DOI 01/22/14    The patient was complaining some pain in her second toe we x-rayed her today was normal  Her fibular fracture shows callus formation  She's been in a boot since October  I recommend she start normal shoe wear  Meds ordered this encounter  Medications  . HYDROcodone-acetaminophen (NORCO) 7.5-325 MG per tablet    Sig: Take 1 tablet by mouth 3 (three) times daily.    Dispense:  90 tablet    Refill:  0   I did not schedule follow-up but she is welcome to follow-up if no improvement after one month

## 2020-01-03 DIAGNOSIS — I10 Essential (primary) hypertension: Secondary | ICD-10-CM | POA: Insufficient documentation

## 2020-01-03 DIAGNOSIS — N3000 Acute cystitis without hematuria: Secondary | ICD-10-CM | POA: Insufficient documentation

## 2020-01-03 DIAGNOSIS — R109 Unspecified abdominal pain: Secondary | ICD-10-CM | POA: Insufficient documentation

## 2020-11-07 ENCOUNTER — Emergency Department (HOSPITAL_COMMUNITY): Payer: No Typology Code available for payment source

## 2020-11-07 ENCOUNTER — Other Ambulatory Visit: Payer: Self-pay

## 2020-11-07 ENCOUNTER — Emergency Department (HOSPITAL_COMMUNITY)
Admission: EM | Admit: 2020-11-07 | Discharge: 2020-11-07 | Disposition: A | Discharge status: Home / Self Care | Payer: No Typology Code available for payment source | Attending: Emergency Medicine | Admitting: Emergency Medicine

## 2020-11-07 ENCOUNTER — Encounter (HOSPITAL_COMMUNITY): Payer: Self-pay | Admitting: Emergency Medicine

## 2020-11-07 DIAGNOSIS — I10 Essential (primary) hypertension: Secondary | ICD-10-CM | POA: Diagnosis not present

## 2020-11-07 DIAGNOSIS — Z79899 Other long term (current) drug therapy: Secondary | ICD-10-CM | POA: Insufficient documentation

## 2020-11-07 DIAGNOSIS — M5441 Lumbago with sciatica, right side: Secondary | ICD-10-CM | POA: Insufficient documentation

## 2020-11-07 DIAGNOSIS — R1010 Upper abdominal pain, unspecified: Secondary | ICD-10-CM | POA: Insufficient documentation

## 2020-11-07 DIAGNOSIS — M545 Low back pain, unspecified: Secondary | ICD-10-CM | POA: Diagnosis present

## 2020-11-07 DIAGNOSIS — R1013 Epigastric pain: Secondary | ICD-10-CM | POA: Diagnosis not present

## 2020-11-07 LAB — CBC WITH DIFFERENTIAL/PLATELET
Abs Immature Granulocytes: 0.02 10*3/uL (ref 0.00–0.07)
Basophils Absolute: 0 10*3/uL (ref 0.0–0.1)
Basophils Relative: 1 %
Eosinophils Absolute: 0.4 10*3/uL (ref 0.0–0.5)
Eosinophils Relative: 5 %
HCT: 38.7 % (ref 36.0–46.0)
Hemoglobin: 12.3 g/dL (ref 12.0–15.0)
Immature Granulocytes: 0 %
Lymphocytes Relative: 28 %
Lymphs Abs: 2 10*3/uL (ref 0.7–4.0)
MCH: 28.1 pg (ref 26.0–34.0)
MCHC: 31.8 g/dL (ref 30.0–36.0)
MCV: 88.6 fL (ref 80.0–100.0)
Monocytes Absolute: 0.5 10*3/uL (ref 0.1–1.0)
Monocytes Relative: 7 %
Neutro Abs: 4.1 10*3/uL (ref 1.7–7.7)
Neutrophils Relative %: 59 %
Platelets: 262 10*3/uL (ref 150–400)
RBC: 4.37 MIL/uL (ref 3.87–5.11)
RDW: 13.8 % (ref 11.5–15.5)
WBC: 7 10*3/uL (ref 4.0–10.5)
nRBC: 0 % (ref 0.0–0.2)

## 2020-11-07 LAB — URINALYSIS, ROUTINE W REFLEX MICROSCOPIC
Bilirubin Urine: NEGATIVE
Glucose, UA: NEGATIVE mg/dL
Hgb urine dipstick: NEGATIVE
Ketones, ur: NEGATIVE mg/dL
Leukocytes,Ua: NEGATIVE
Nitrite: POSITIVE — AB
Protein, ur: NEGATIVE mg/dL
Specific Gravity, Urine: 1.011 (ref 1.005–1.030)
pH: 6 (ref 5.0–8.0)

## 2020-11-07 LAB — LIPASE, BLOOD: Lipase: 29 U/L (ref 11–51)

## 2020-11-07 LAB — COMPREHENSIVE METABOLIC PANEL
ALT: 66 U/L — ABNORMAL HIGH (ref 0–44)
AST: 40 U/L (ref 15–41)
Albumin: 3.3 g/dL — ABNORMAL LOW (ref 3.5–5.0)
Alkaline Phosphatase: 143 U/L — ABNORMAL HIGH (ref 38–126)
Anion gap: 3 — ABNORMAL LOW (ref 5–15)
BUN: 13 mg/dL (ref 6–20)
CO2: 27 mmol/L (ref 22–32)
Calcium: 8.4 mg/dL — ABNORMAL LOW (ref 8.9–10.3)
Chloride: 103 mmol/L (ref 98–111)
Creatinine, Ser: 0.98 mg/dL (ref 0.44–1.00)
GFR, Estimated: >60 mL/min (ref >60)
Glucose, Bld: 87 mg/dL (ref 70–99)
Potassium: 4.2 mmol/L (ref 3.5–5.1)
Sodium: 133 mmol/L — ABNORMAL LOW (ref 135–145)
Total Bilirubin: 0.6 mg/dL (ref 0.3–1.2)
Total Protein: 7.2 g/dL (ref 6.5–8.1)

## 2020-11-07 LAB — POC URINE PREG, ED: Preg Test, Ur: NEGATIVE

## 2020-11-07 MED ORDER — METHOCARBAMOL 500 MG PO TABS: 500.0000 mg | ORAL_TABLET | Freq: Three times a day (TID) | ORAL | 0 refills | Status: DC

## 2020-11-07 MED ORDER — FENTANYL CITRATE PF 50 MCG/ML IJ SOSY
50.0000 ug | PREFILLED_SYRINGE | Freq: Once | INTRAMUSCULAR | Status: AC
  Administered 2020-11-07: 50 ug via INTRAVENOUS
  Filled 2020-11-07: qty 1

## 2020-11-07 MED ORDER — HYDROMORPHONE HCL 1 MG/ML IJ SOLN
1.0000 mg | Freq: Once | INTRAMUSCULAR | Status: AC
Start: 2020-11-07 — End: 2020-11-07
  Administered 2020-11-07: 1 mg via INTRAVENOUS
  Filled 2020-11-07: qty 1

## 2020-11-07 MED ORDER — SULFAMETHOXAZOLE-TRIMETHOPRIM 800-160 MG PO TABS: 1.0000 | ORAL_TABLET | Freq: Two times a day (BID) | ORAL | 0 refills | Status: AC

## 2020-11-07 MED ORDER — ONDANSETRON HCL 4 MG/2ML IJ SOLN
4.0000 mg | Freq: Once | INTRAMUSCULAR | Status: AC
  Administered 2020-11-07: 4 mg via INTRAVENOUS
  Filled 2020-11-07: qty 2

## 2020-11-07 MED ORDER — PREDNISONE 10 MG PO TABS: ORAL_TABLET | ORAL | 0 refills | Status: DC

## 2020-11-07 NOTE — ED Notes (Signed)
Discharge instructions including prescription and follow up care discussed with pt. Pt verbalized understanding with no questions at this time. Pt discharges with husband. Wheelchair to car by this RN

## 2020-11-07 NOTE — Discharge Instructions (Addendum)
Your CT scan today did not show any evidence of kidney stones.  Your urine does show an infection.  You will need to take the antibiotic as directed until its finished.  Drink plenty of water.  I have listed some of the local care facilities for you to establish primary care.

## 2020-11-07 NOTE — ED Triage Notes (Signed)
Pt reports lower abdominal pain radiating to right lower back. Pt concerned for kidney stones, hx of same. Pt also reports left ankle swelling. Denies injury

## 2020-11-07 NOTE — ED Provider Notes (Signed)
[facility] EMERGENCY DEPARTMENT Provider Note   CSN: [id number] Arrival date & time: 11/07/20  1619     History Chief Complaint  Patient presents with   Flank Pain    Tracie Green is a 45 y.o. female.   Flank Pain Associated symptoms include abdominal pain. Pertinent negatives include no chest pain and no shortness of breath.       Tracie Green is a 45 y.o. female who presents to the Emergency Department complaining of right lower back pain and upper abdominal pain.  Symptoms been present for several days.  She describes a sharp pain to her lower spine that radiates to the right lower back, buttock, and hip.  She also endorses some pain to her epigastric area.  States that she has a history of kidney stones, but current pain does not feel similar.  She denies any chest pain, shortness of breath, numbness or weakness of her lower extremities, urine or bowel changes, vomiting or fever.  She does note some swelling of her left ankle as well.  No known injury.  No numbness or tingling of her ankle, leg or foot.   Past Medical History:  Diagnosis Date   Hypertension    PE (pulmonary embolism)     Patient Active Problem List   Diagnosis Date Noted   Fibula fracture 01/29/2014    Past Surgical History:  Procedure Laterality Date   CESAREAN SECTION     CHOLECYSTECTOMY     INCISION AND DRAINAGE       OB History   No obstetric history on file.     History reviewed. No pertinent family history.  Social History   Tobacco Use   Smoking status: Never  Substance Use Topics   Alcohol use: No   Drug use: No    Home Medications Prior to Admission medications   Medication Sig Start Date End Date Taking? Authorizing Provider  amLODipine (NORVASC) 5 MG tablet Take 5 mg by mouth daily.    [provider]  gabapentin (NEURONTIN) 100 MG capsule Take 100 mg by mouth 3 (three) times daily.    [provider]  HYDROcodone-acetaminophen (NORCO) 7.5-325 MG per  tablet Take 1 tablet by mouth 3 (three) times daily. 04/16/14   Vickki Hearing, MD  hydrOXYzine (ATARAX/VISTARIL) 25 MG tablet Take 25 mg by mouth 3 (three) times daily as needed for anxiety.    [provider]  lisinopril-hydrochlorothiazide (PRINZIDE,ZESTORETIC) 20-25 MG per tablet Take 1 tablet by mouth daily.    [provider]  naproxen (NAPROSYN) 500 MG tablet Take 1 tablet (500 mg total) by mouth 2 (two) times daily with a meal. Patient not taking: Reported on 04/16/2014 01/22/14   Carlyon Nolasco, PA-C  omeprazole (PRILOSEC) 40 MG capsule Take 40 mg by mouth daily.    [provider]  oxyCODONE-acetaminophen (PERCOCET) 7.5-325 MG per tablet Take 1 tablet by mouth every 4 (four) hours as needed for pain. Patient not taking: Reported on 03/12/2014 01/29/14   Vickki Hearing, MD    Allergies    Morphine and related and Penicillins  Review of Systems   Review of Systems  Constitutional:  Negative for chills, fatigue and fever.  Respiratory:  Negative for cough, shortness of breath and wheezing.   Cardiovascular:  Negative for chest pain and palpitations.  Gastrointestinal:  Positive for abdominal pain. Negative for blood in stool, nausea and vomiting.  Genitourinary:  Positive for flank pain. Negative for dysuria and hematuria.  Musculoskeletal:  Positive  for arthralgias (Left ankle swelling) and back pain. Negative for myalgias, neck pain and neck stiffness.  Skin:  Negative for color change and rash.  Neurological:  Negative for dizziness, weakness and numbness.  Hematological:  Does not bruise/bleed easily.   Physical Exam Updated Vital Signs BP 126/72   Pulse 78   Temp 98.5 F (36.9 C) (Oral)   Resp 17   Ht 5\' 9"  (1.753 m)   Wt (!) 177.8 kg   SpO2 100%   BMI 57.89 kg/m   Physical Exam Vitals and nursing note reviewed.  Constitutional:      Appearance: Normal appearance. She is obese. She is not ill-appearing or toxic-appearing.   Neck:     Thyroid: No thyromegaly.     Meningeal: Kernig's sign absent.  Cardiovascular:     Rate and Rhythm: Normal rate and regular rhythm.     Pulses: Normal pulses.  Pulmonary:     Effort: Pulmonary effort is normal.     Breath sounds: Normal breath sounds. No wheezing.  Chest:     Chest wall: No tenderness.  Abdominal:     Palpations: Abdomen is soft.     Tenderness: There is no abdominal tenderness. There is no right CVA tenderness, left CVA tenderness, guarding or rebound.  Musculoskeletal:        General: Normal range of motion.     Cervical back: Normal range of motion. No tenderness.     Comments: Patient has tenderness of the lower lumbar spine and right paraspinal muscles.  Positive straight leg raise on the right.  Left ankle nontender.  No obvious edema noted on exam.  Bilateral calfs are soft without posterior tenderness or erythema.  Skin:    General: Skin is warm and dry.     Findings: No rash.  Neurological:     General: No focal deficit present.     Mental Status: She is alert and oriented to person, place, and time.     Motor: No weakness.    ED Results / Procedures / Treatments   Labs (all labs ordered are listed, but only abnormal results are displayed) Labs Reviewed  URINALYSIS, ROUTINE W REFLEX MICROSCOPIC - Abnormal; Notable for the following components:      Result Value   APPearance HAZY (*)    Nitrite POSITIVE (*)    Bacteria, UA RARE (*)    All other components within normal limits  COMPREHENSIVE METABOLIC PANEL - Abnormal; Notable for the following components:   Sodium 133 (*)    Calcium 8.4 (*)    Albumin 3.3 (*)    ALT 66 (*)    Alkaline Phosphatase 143 (*)    Anion gap 3 (*)    All other components within normal limits  URINE CULTURE  LIPASE, BLOOD  CBC WITH DIFFERENTIAL/PLATELET  POC URINE PREG, ED    EKG None  Radiology CT Renal Stone Study  Result Date: 11/07/2020 CLINICAL DATA:  Right lower back pain EXAM: CT ABDOMEN AND  PELVIS WITHOUT CONTRAST TECHNIQUE: Multidetector CT imaging of the abdomen and pelvis was performed following the standard protocol without IV contrast. COMPARISON:  CT 03/19/2013, 05/07/2014 FINDINGS: Lower chest: Lung bases demonstrate no acute consolidation or effusion. Normal cardiac size. Hepatobiliary: No focal liver abnormality is seen. Status post cholecystectomy. No biliary dilatation. Pancreas: Unremarkable. No pancreatic ductal dilatation or surrounding inflammatory changes. Spleen: Normal in size without focal abnormality. Adrenals/Urinary Tract: Adrenal glands are normal. No hydronephrosis. Small nonobstructing left kidney stones. Hypodense cortical lesion  mid left kidney probably a cyst but further characterisation limited without contrast. The bladder is normal. Multiple punctate phleboliths in the pelvis. Stomach/Bowel: Stomach is within normal limits. Appendix appears normal. No evidence of bowel wall thickening, distention, or inflammatory changes. Vascular/Lymphatic: No significant vascular findings are present. No enlarged abdominal or pelvic lymph nodes. Reproductive: Uterus and bilateral adnexa are unremarkable. Other: Negative for free air. Small fat containing umbilical hernia. No abdominopelvic ascites. Musculoskeletal: No acute or significant osseous findings. IMPRESSION: 1. Negative for hydronephrosis or ureteral stone. 2. Nonobstructing left kidney stones Electronically Signed   By: Jasmine Pang M.D.   On: 11/07/2020 19:10    Procedures Procedures   Medications Ordered in ED Medications  fentaNYL (SUBLIMAZE) injection 50 mcg (50 mcg Intravenous Given 11/07/20 1758)  ondansetron (ZOFRAN) injection 4 mg (4 mg Intravenous Given 11/07/20 1757)  HYDROmorphone (DILAUDID) injection 1 mg (1 mg Intravenous Given 11/07/20 1941)    ED Course  I have reviewed the triage vital signs and the nursing notes.  Pertinent labs & imaging results that were available during my care of the patient  were reviewed by me and considered in my medical decision making (see chart for details).    MDM Rules/Calculators/A&P                           Patient here with right-sided low back pain with right-sided sciatica.  States she has flank pain as well.  No dysuria, fever, or chills, no CVA tenderness on exam to suggest pyelonephritis.  Patient is well-appearing nontoxic.  Pain is likely related to low back pain.  She is morbidly obese.  Abdomen is soft and nontender.  Doubt acute abdominal process.  No red flags on exam to suggest cauda equina.  No recent procedures to suggest spinal abscess.  Patient is nontoxic.Marland Kitchen  CT abdomen pelvis without evidence of nephrolithiasis or ureterolithiasis.  Labs show no leukocytosis.  Lipase reassuring.  Electrolytes without acute derangement.  Patient is not pregnant.  Urinalysis is nitrite positive with 0-5 WBCs and rare bacteria.  Urine culture pending.  Patient reviewed on database, she stated to me that she takes 10 mg Percocet and tizanidine for chronic back pain.  She did not tell me this on initial history.  States she has ran out of her pain medication and moved here from [place] and has been unable to find a PCP or pain management provider due to lack of insurance.  Database shows that she received buprenorphine prescription for 28-day course on 10/30/2020. Patient likely has acute on chronic low back pain.  I do not feel that additional narcotic prescriptions are indicated.  We will treat with antibiotics for UTI, muscle relaxer and prednisone.  Patient agreeable to this plan.  Return precautions discussed.  Patient also given follow-up information to establish primary care.  Final Clinical Impression(s) / ED Diagnoses Final diagnoses:  Acute right-sided low back pain with right-sided sciatica    Rx / DC Orders ED Discharge Orders     None        Rosey Bath 11/08/20 2118    Bethann Berkshire, MD 11/14/20 1032

## 2020-11-11 LAB — URINE CULTURE: Culture: >=100000 — AB

## 2020-11-13 ENCOUNTER — Telehealth: Payer: Self-pay | Admitting: Emergency Medicine

## 2020-11-13 NOTE — Telephone Encounter (Unsigned)
Post ED Visit - Positive Culture Follow-up  Culture report reviewed by antimicrobial stewardship pharmacist: [facility] []  , Pharm.D. []  Enzo Bi, Pharm.D., BCPS AQ-ID []  , Pharm.D., BCPS []  Celedonio Miyamoto, Pharm.D., BCPS []  [place], Garvin Fila.D., BCPS, AAHIVP []  , Pharm.D., BCPS, AAHIVP []  Georgina Pillion, PharmD, BCPS []  , PharmD, BCPS []  [place], PharmD, BCPS [x]  [address], PharmD []  , PharmD, BCPS []  Estella Husk, PharmD  Pharmacy Team []  Lysle Pearl, PharmD []  , PharmD []  Phillips Climes, PharmD []  , Rph []  Agapito Games) , PharmD []  Rexene Edison, PharmD []  , PharmD []  Mervyn Gay, PharmD []  , PharmD []  Vinnie Level, PharmD []  [facility], PharmD []  , PharmD []  Len Childs, PharmD   Positive urine culture Treated with Sulfamethoxazole-trimethoprim, organism sensitive to the same and no further patient follow-up is required at this time.  Tracie Green 11/13/2020, 1:00 PM

## 2021-01-24 ENCOUNTER — Emergency Department (HOSPITAL_COMMUNITY): Payer: No Typology Code available for payment source

## 2021-01-24 ENCOUNTER — Encounter (HOSPITAL_COMMUNITY): Payer: Self-pay | Admitting: *Deleted

## 2021-01-24 ENCOUNTER — Emergency Department (HOSPITAL_COMMUNITY)
Admission: EM | Admit: 2021-01-24 | Discharge: 2021-01-24 | Disposition: A | Discharge status: Home / Self Care | Payer: No Typology Code available for payment source | Attending: Emergency Medicine | Admitting: Emergency Medicine

## 2021-01-24 ENCOUNTER — Other Ambulatory Visit: Payer: Self-pay

## 2021-01-24 DIAGNOSIS — I10 Essential (primary) hypertension: Secondary | ICD-10-CM | POA: Insufficient documentation

## 2021-01-24 DIAGNOSIS — U071 COVID-19: Secondary | ICD-10-CM | POA: Insufficient documentation

## 2021-01-24 DIAGNOSIS — Z79899 Other long term (current) drug therapy: Secondary | ICD-10-CM | POA: Diagnosis not present

## 2021-01-24 DIAGNOSIS — J029 Acute pharyngitis, unspecified: Secondary | ICD-10-CM | POA: Diagnosis present

## 2021-01-24 DIAGNOSIS — J3489 Other specified disorders of nose and nasal sinuses: Secondary | ICD-10-CM | POA: Insufficient documentation

## 2021-01-24 LAB — RESP PANEL BY RT-PCR (FLU A&B, COVID) ARPGX2
Influenza A by PCR: NEGATIVE
Influenza B by PCR: NEGATIVE
SARS Coronavirus 2 by RT PCR: POSITIVE — AB

## 2021-01-24 MED ORDER — BENZONATATE 100 MG PO CAPS: 100.0000 mg | ORAL_CAPSULE | Freq: Three times a day (TID) | ORAL | 0 refills | Status: AC

## 2021-01-24 MED ORDER — FLUTICASONE PROPIONATE 50 MCG/ACT NA SUSP: 2.0000 | Freq: Every day | NASAL | 0 refills | Status: DC

## 2021-01-24 MED ORDER — NIRMATRELVIR/RITONAVIR (PAXLOVID)TABLET: 3.0000 | ORAL_TABLET | Freq: Two times a day (BID) | ORAL | 0 refills | Status: AC

## 2021-01-24 NOTE — ED Triage Notes (Signed)
Pt with chest hurting, seen earlier today and was Covid positive.  Pt with emesis.

## 2021-01-24 NOTE — Discharge Instructions (Addendum)
Use flonase and tessalon as prescribed   Take paxlovid as directed  If you were given a prescription, please take the prescription as you were instructed and follow the directions given on the discharge paperwork.   Over the next several days you should rest as much as possible, and drink more fluids than usual. Liquids will help thin and loosen mucus so you can cough it up. Liquids will also help prevent dehydration. Using a cool mist humidifier or a vaporizer to increase air moisture in your home can also make it easier for you to breathe and help decrease your cough.  To help soothe a sore throat gargle with warm salt water.  Make salt water by dissolving  teaspoon salt in 1 cup warm water. You may also use throat lozenges and over the counter sore throat spray.  You should be isolated for at least 5 days since the onset of your symptoms AND >72 hours after symptoms resolution (absence of fever without the use of fever reducing medicaiton and improvement in respiratory symptoms), whichever is longer   Please follow up with your primary care provider within 5-7 days for re-evaluation of your symptoms. If you do not have a primary care provider, information for a healthcare clinic has been provided for you to make arrangements for follow up care. Please return to the emergency department for any persistent fevers, worsening sore throat/hoarse voice, inability to swallow, persistent vomiting, chest pain, shortness of breath, coughing up blood, or any new or worsening symptoms.

## 2021-01-24 NOTE — ED Provider Notes (Signed)
[facility] EMERGENCY DEPARTMENT Provider Note   CSN: [id number] Arrival date & time: 01/24/21  1026     History Chief Complaint  Patient presents with   Sore Throat    Tracie Green is a 45 y.o. female.  HPI   45 y/o F presenting for eval of uri sxs. States that for the last few days she has had rhinorrhea, congestion, bilat ear fullness/pain, sore throat, cough and subjective fevers. She has tried theraflu at home without relief. She denies other exacerbating or alleviating factors. Denies sick contacts  Past Medical History:  Diagnosis Date   Hypertension    PE (pulmonary embolism)     Patient Active Problem List   Diagnosis Date Noted   Fibula fracture 01/29/2014    Past Surgical History:  Procedure Laterality Date   CESAREAN SECTION     CHOLECYSTECTOMY     INCISION AND DRAINAGE       OB History   No obstetric history on file.     History reviewed. No pertinent family history.  Social History   Tobacco Use   Smoking status: Never  Substance Use Topics   Alcohol use: No   Drug use: No    Home Medications Prior to Admission medications   Medication Sig Start Date End Date Taking? Authorizing Provider  benzonatate (TESSALON) 100 MG capsule Take 1 capsule (100 mg total) by mouth every 8 (eight) hours for 5 days. 01/24/21 01/29/21 Yes Zhaire Locker S, PA-C  fluticasone (FLONASE) 50 MCG/ACT nasal spray Place 2 sprays into both nostrils daily. 01/24/21  Yes Vilas Edgerly S, PA-C  nirmatrelvir/ritonavir EUA (PAXLOVID) 20 x 150 MG & 10 x 100MG  TABS Take 3 tablets by mouth 2 (two) times daily for 5 days. Patient GFR is >60. Take nirmatrelvir (150 mg) two tablets twice daily for 5 days and ritonavir (100 mg) one tablet twice daily for 5 days. 01/24/21 01/29/21 Yes Jasyah Theurer S, PA-C  amLODipine (NORVASC) 5 MG tablet Take 5 mg by mouth daily.    [provider]  gabapentin (NEURONTIN) 100 MG capsule Take 100 mg by mouth 3 (three) times daily.     [provider]  HYDROcodone-acetaminophen (NORCO) 7.5-325 MG per tablet Take 1 tablet by mouth 3 (three) times daily. 04/16/14   04/18/14, MD  hydrOXYzine (ATARAX/VISTARIL) 25 MG tablet Take 25 mg by mouth 3 (three) times daily as needed for anxiety.    [provider]  lisinopril-hydrochlorothiazide (PRINZIDE,ZESTORETIC) 20-25 MG per tablet Take 1 tablet by mouth daily.    [provider]  methocarbamol (ROBAXIN) 500 MG tablet Take 1 tablet (500 mg total) by mouth 3 (three) times daily. 11/07/20   Triplett, Tammy, PA-C  naproxen (NAPROSYN) 500 MG tablet Take 1 tablet (500 mg total) by mouth 2 (two) times daily with a meal. Patient not taking: Reported on 04/16/2014 01/22/14   Triplett, Tammy, PA-C  omeprazole (PRILOSEC) 40 MG capsule Take 40 mg by mouth daily.    [provider]  oxyCODONE-acetaminophen (PERCOCET) 7.5-325 MG per tablet Take 1 tablet by mouth every 4 (four) hours as needed for pain. Patient not taking: Reported on 03/12/2014 01/29/14   01/31/14, MD  predniSONE (DELTASONE) 10 MG tablet Take 6 tablets day one, 5 tablets day two, 4 tablets day three, 3 tablets day four, 2 tablets day five, then 1 tablet day six 11/07/20   Triplett, Tammy, PA-C    Allergies    Morphine and related and Penicillins  Review of  Systems   Review of Systems  Constitutional:  Positive for fever.  HENT:  Positive for congestion, ear pain, rhinorrhea and sore throat. Negative for sinus pain.   Eyes:  Negative for visual disturbance.  Respiratory:  Positive for cough. Negative for shortness of breath.   Cardiovascular:  Negative for chest pain.  Gastrointestinal:  Negative for abdominal pain.  Genitourinary:  Negative for pelvic pain.  Musculoskeletal:  Negative for back pain.   Physical Exam Updated Vital Signs BP (!) 185/98 (BP Location: Right Arm)   Pulse 94   Temp 98.3 F (36.8 C)   Resp 19   Ht 5\' 9"  (1.753 m)   Wt (!) 172.4 kg   LMP  01/23/2021   SpO2 95%   BMI 56.12 kg/m   Physical Exam Vitals and nursing note reviewed.  Constitutional:      General: She is not in acute distress.    Appearance: She is well-developed.  HENT:     Head: Normocephalic and atraumatic.     Right Ear: No drainage or swelling. A middle ear effusion is present. Tympanic membrane is not erythematous.     Left Ear: No drainage or swelling. A middle ear effusion is present. Tympanic membrane is not erythematous.     Mouth/Throat:     Mouth: Mucous membranes are moist.     Pharynx: Uvula midline. No oropharyngeal exudate or posterior oropharyngeal erythema.     Tonsils: No tonsillar exudate or tonsillar abscesses. 0 on the right. 0 on the left.  Eyes:     Conjunctiva/sclera: Conjunctivae normal.  Cardiovascular:     Rate and Rhythm: Normal rate and regular rhythm.  Pulmonary:     Effort: Pulmonary effort is normal.     Breath sounds: Normal breath sounds. No wheezing.  Abdominal:     Palpations: Abdomen is soft.     Tenderness: There is no abdominal tenderness.  Musculoskeletal:        General: Normal range of motion.     Cervical back: Neck supple.  Skin:    General: Skin is warm and dry.  Neurological:     Mental Status: She is alert.    ED Results / Procedures / Treatments   Labs (all labs ordered are listed, but only abnormal results are displayed) Labs Reviewed  RESP PANEL BY RT-PCR (FLU A&B, COVID) ARPGX2 - Abnormal; Notable for the following components:      Result Value   SARS Coronavirus 2 by RT PCR POSITIVE (*)    All other components within normal limits    EKG None  Radiology No results found.  Procedures Procedures   Medications Ordered in ED Medications - No data to display  ED Course  I have reviewed the triage vital signs and the nursing notes.  Pertinent labs & imaging results that were available during my care of the patient were reviewed by me and considered in my medical decision making (see  chart for details).    MDM Rules/Calculators/A&P                          Pt here for URI sxs for several days. Patients symptoms are consistent with URI, likely viral etiology. Covid positive. Flu neg. Discussed that antibiotics are not indicated for viral infections. Pt will be discharged with symptomatic treatment and with paxlovid, last cr wnl and pt only on lisinopril. Verbalizes understanding and is agreeable with plan. Pt is hemodynamically stable & in NAD  prior to dc.  Final Clinical Impression(s) / ED Diagnoses Final diagnoses:  COVID    Rx / DC Orders ED Discharge Orders          Ordered    nirmatrelvir/ritonavir EUA (PAXLOVID) 20 x 150 MG & 10 x 100MG  TABS  2 times daily        01/24/21 1302    benzonatate (TESSALON) 100 MG capsule  Every 8 hours        01/24/21 1302    fluticasone (FLONASE) 50 MCG/ACT nasal spray  Daily        01/24/21 [address] Servando Kyllonen S, PA-C 01/24/21 1305    01/26/21, MD 01/25/21 [phone]

## 2021-01-24 NOTE — ED Triage Notes (Signed)
Pt with sore throat and earaches x 3 days. Unknown of fevers. Chills last night.

## 2021-01-25 ENCOUNTER — Emergency Department (HOSPITAL_COMMUNITY)
Admission: EM | Admit: 2021-01-25 | Discharge: 2021-01-25 | Disposition: A | Discharge status: Home / Self Care | Payer: No Typology Code available for payment source | Source: Home / Self Care | Attending: Emergency Medicine | Admitting: Emergency Medicine

## 2021-01-25 ENCOUNTER — Encounter (HOSPITAL_COMMUNITY): Payer: Self-pay

## 2021-01-25 ENCOUNTER — Emergency Department (HOSPITAL_COMMUNITY): Payer: No Typology Code available for payment source

## 2021-01-25 DIAGNOSIS — R0789 Other chest pain: Secondary | ICD-10-CM

## 2021-01-25 DIAGNOSIS — U071 COVID-19: Secondary | ICD-10-CM

## 2021-01-25 LAB — CBC WITH DIFFERENTIAL/PLATELET
Abs Immature Granulocytes: 0.03 10*3/uL (ref 0.00–0.07)
Basophils Absolute: 0 10*3/uL (ref 0.0–0.1)
Basophils Relative: 0 %
Eosinophils Absolute: 0.2 10*3/uL (ref 0.0–0.5)
Eosinophils Relative: 3 %
HCT: 37.7 % (ref 36.0–46.0)
Hemoglobin: 12.1 g/dL (ref 12.0–15.0)
Immature Granulocytes: 0 %
Lymphocytes Relative: 17 %
Lymphs Abs: 1.2 10*3/uL (ref 0.7–4.0)
MCH: 28.2 pg (ref 26.0–34.0)
MCHC: 32.1 g/dL (ref 30.0–36.0)
MCV: 87.9 fL (ref 80.0–100.0)
Monocytes Absolute: 0.6 10*3/uL (ref 0.1–1.0)
Monocytes Relative: 9 %
Neutro Abs: 4.7 10*3/uL (ref 1.7–7.7)
Neutrophils Relative %: 71 %
Platelets: 281 10*3/uL (ref 150–400)
RBC: 4.29 MIL/uL (ref 3.87–5.11)
RDW: 13.3 % (ref 11.5–15.5)
WBC: 6.8 10*3/uL (ref 4.0–10.5)
nRBC: 0 % (ref 0.0–0.2)

## 2021-01-25 LAB — TROPONIN I (HIGH SENSITIVITY)
Troponin I (High Sensitivity): 6 ng/L (ref <18)
Troponin I (High Sensitivity): 5 ng/L (ref <18)

## 2021-01-25 LAB — BASIC METABOLIC PANEL
Anion gap: 7 (ref 5–15)
BUN: 8 mg/dL (ref 6–20)
CO2: 27 mmol/L (ref 22–32)
Calcium: 8.6 mg/dL — ABNORMAL LOW (ref 8.9–10.3)
Chloride: 100 mmol/L (ref 98–111)
Creatinine, Ser: 1.1 mg/dL — ABNORMAL HIGH (ref 0.44–1.00)
GFR, Estimated: >60 mL/min (ref >60)
Glucose, Bld: 112 mg/dL — ABNORMAL HIGH (ref 70–99)
Potassium: 3.7 mmol/L (ref 3.5–5.1)
Sodium: 134 mmol/L — ABNORMAL LOW (ref 135–145)

## 2021-01-25 LAB — D-DIMER, QUANTITATIVE: D-Dimer, Quant: 1.07 ug/mL-FEU — ABNORMAL HIGH (ref 0.00–0.50)

## 2021-01-25 MED ORDER — SODIUM CHLORIDE 0.9 % IV BOLUS
1000.0000 mL | Freq: Once | INTRAVENOUS | Status: AC
  Administered 2021-01-25: 1000 mL via INTRAVENOUS

## 2021-01-25 MED ORDER — KETOROLAC TROMETHAMINE 30 MG/ML IJ SOLN
30.0000 mg | Freq: Once | INTRAMUSCULAR | Status: AC
  Administered 2021-01-25: 30 mg via INTRAVENOUS
  Filled 2021-01-25: qty 1

## 2021-01-25 MED ORDER — ONDANSETRON HCL 4 MG/2ML IJ SOLN
4.0000 mg | Freq: Once | INTRAMUSCULAR | Status: AC
  Administered 2021-01-25: 4 mg via INTRAVENOUS
  Filled 2021-01-25: qty 2

## 2021-01-25 MED ORDER — IOHEXOL 350 MG/ML SOLN
100.0000 mL | Freq: Once | INTRAVENOUS | Status: AC | PRN
  Administered 2021-01-25: 100 mL via INTRAVENOUS

## 2021-01-25 NOTE — Discharge Instructions (Signed)
Continue Paxlovid as previously prescribed.  Take ibuprofen 600 mg every 6 hours as needed for pain.  Drink plenty of fluids and get plenty of rest.  Isolate at home for the next 5 days, and return to the ER if you develop severe chest pain, worsening breathing, or other new and concerning symptoms.

## 2021-01-25 NOTE — ED Notes (Signed)
Pt given hospital gown and instructed to undress from waist up and apply hospital gown.

## 2021-01-25 NOTE — ED Provider Notes (Signed)
[facility] EMERGENCY DEPARTMENT Provider Note   CSN: [id number] Arrival date & time: 01/24/21  2023     History Chief Complaint  Patient presents with   Chest Pain    Tracie Green is a 45 y.o. female.  Patient is a 45 year old female with past medical history of hypertension.  Patient presenting today with complaints of chest tightness, cough, earache, and body aches.  She was seen earlier today with similar complaints and diagnosed with COVID-19.  She was started on Paxlovid and has taken 1 dose.  She returns this evening not feeling any better.  She denies any leg pain or swelling.  The history is provided by the patient.      Past Medical History:  Diagnosis Date   Hypertension    PE (pulmonary embolism)     Patient Active Problem List   Diagnosis Date Noted   Acute cystitis without hematuria 01/03/2020   Essential hypertension 01/03/2020   Right sided abdominal pain 01/03/2020   Fibula fracture 01/29/2014    Past Surgical History:  Procedure Laterality Date   CESAREAN SECTION     CHOLECYSTECTOMY     INCISION AND DRAINAGE       OB History   No obstetric history on file.     History reviewed. No pertinent family history.  Social History   Tobacco Use   Smoking status: Never   Smokeless tobacco: Never  Substance Use Topics   Alcohol use: No   Drug use: No    Home Medications Prior to Admission medications   Medication Sig Start Date End Date Taking? Authorizing Provider  amLODipine (NORVASC) 5 MG tablet Take 5 mg by mouth daily.    [provider]  benzonatate (TESSALON) 100 MG capsule Take 1 capsule (100 mg total) by mouth every 8 (eight) hours for 5 days. 01/24/21 01/29/21  Couture, Cortni S, PA-C  fluticasone (FLONASE) 50 MCG/ACT nasal spray Place 2 sprays into both nostrils daily. 01/24/21   Couture, Cortni S, PA-C  gabapentin (NEURONTIN) 100 MG capsule Take 100 mg by mouth 3 (three) times daily.    [provider]   HYDROcodone-acetaminophen (NORCO) 7.5-325 MG per tablet Take 1 tablet by mouth 3 (three) times daily. 04/16/14   Vickki Hearing, MD  hydrOXYzine (ATARAX/VISTARIL) 25 MG tablet Take 25 mg by mouth 3 (three) times daily as needed for anxiety.    [provider]  lisinopril-hydrochlorothiazide (PRINZIDE,ZESTORETIC) 20-25 MG per tablet Take 1 tablet by mouth daily.    [provider]  methocarbamol (ROBAXIN) 500 MG tablet Take 1 tablet (500 mg total) by mouth 3 (three) times daily. 11/07/20   Triplett, Tammy, PA-C  naproxen (NAPROSYN) 500 MG tablet Take 1 tablet (500 mg total) by mouth 2 (two) times daily with a meal. Patient not taking: Reported on 04/16/2014 01/22/14   Pauline Aus, PA-C  nirmatrelvir/ritonavir EUA (PAXLOVID) 20 x 150 MG & 10 x 100MG  TABS Take 3 tablets by mouth 2 (two) times daily for 5 days. Patient GFR is >60. Take nirmatrelvir (150 mg) two tablets twice daily for 5 days and ritonavir (100 mg) one tablet twice daily for 5 days. 01/24/21 01/29/21  Couture, Cortni S, PA-C  omeprazole (PRILOSEC) 40 MG capsule Take 40 mg by mouth daily.    [provider]  oxyCODONE-acetaminophen (PERCOCET) 7.5-325 MG per tablet Take 1 tablet by mouth every 4 (four) hours as needed for pain. Patient not taking: Reported on 03/12/2014 01/29/14   01/31/14, MD  predniSONE (  DELTASONE) 10 MG tablet Take 6 tablets day one, 5 tablets day two, 4 tablets day three, 3 tablets day four, 2 tablets day five, then 1 tablet day six 11/07/20   Triplett, Tammy, PA-C    Allergies    Penicillins, Morphine, and Morphine and related  Review of Systems   Review of Systems  All other systems reviewed and are negative.  Physical Exam Updated Vital Signs BP (!) 164/114   Pulse 100   Temp 98.6 F (37 C) (Oral)   Resp 18   LMP 01/23/2021   SpO2 97%   Physical Exam Vitals and nursing note reviewed.  Constitutional:      General: She is not in acute distress.     Appearance: She is well-developed. She is not diaphoretic.  HENT:     Head: Normocephalic and atraumatic.  Cardiovascular:     Rate and Rhythm: Normal rate and regular rhythm.     Heart sounds: No murmur heard.   No friction rub. No gallop.  Pulmonary:     Effort: Pulmonary effort is normal. No respiratory distress.     Breath sounds: Normal breath sounds. No wheezing.  Abdominal:     General: Bowel sounds are normal. There is no distension.     Palpations: Abdomen is soft.     Tenderness: There is no abdominal tenderness.  Musculoskeletal:        General: Normal range of motion.     Cervical back: Normal range of motion and neck supple.  Skin:    General: Skin is warm and dry.  Neurological:     General: No focal deficit present.     Mental Status: She is alert and oriented to person, place, and time.    ED Results / Procedures / Treatments   Labs (all labs ordered are listed, but only abnormal results are displayed) Labs Reviewed  BASIC METABOLIC PANEL  D-DIMER, QUANTITATIVE  TROPONIN I (HIGH SENSITIVITY)    EKG None  Radiology DG Chest Portable 1 View  Result Date: 01/24/2021 CLINICAL DATA:  Chest pain EXAM: PORTABLE CHEST 1 VIEW COMPARISON:  03/25/2010 FINDINGS: Patchy opacity left base. No pleural effusion. Normal cardiomediastinal silhouette. No pneumothorax IMPRESSION: Mild patchy left lung base opacity may reflect atelectasis or mild pneumonia Electronically Signed   By: Jasmine Pang M.D.   On: 01/24/2021 22:23    Procedures Procedures   Medications Ordered in ED Medications  sodium chloride 0.9 % bolus 1,000 mL (has no administration in time range)  ketorolac (TORADOL) 30 MG/ML injection 30 mg (has no administration in time range)  ondansetron (ZOFRAN) injection 4 mg (has no administration in time range)    ED Course  I have reviewed the triage vital signs and the nursing notes.  Pertinent labs & imaging results that were available during my care of  the patient were reviewed by me and considered in my medical decision making (see chart for details).    MDM Rules/Calculators/A&P  Patient presenting here with complaints of chest pain and feeling short of breath.  She was diagnosed with COVID-19 earlier today.  Patient's EKG shows no evidence for cardiac etiology and troponin x2 is negative.  Patient has history of PEs in the past and today's D-dimer is 1, however CTA is negative.  Patient hydrated and given Toradol.  At this point, I feel as though an emergent cause of her pain has been ruled out.  I suspect COVID as the cause.  Patient to continue Paxlovid as previously  prescribed.  She is to take ibuprofen, drink plenty of fluids, and return as needed if symptoms worsen.  There is no hypoxia or respiratory distress.  Final Clinical Impression(s) / ED Diagnoses Final diagnoses:  None    Rx / DC Orders ED Discharge Orders     None        Geoffery Lyons, MD 01/25/21 [phone]

## 2021-06-07 ENCOUNTER — Emergency Department (HOSPITAL_COMMUNITY)
Admission: EM | Admit: 2021-06-07 | Discharge: 2021-06-07 | Disposition: A | Discharge status: Home / Self Care | Payer: 59 | Attending: Emergency Medicine | Admitting: Emergency Medicine

## 2021-06-07 ENCOUNTER — Encounter (HOSPITAL_COMMUNITY): Payer: Self-pay | Admitting: *Deleted

## 2021-06-07 ENCOUNTER — Emergency Department (HOSPITAL_COMMUNITY): Payer: 59

## 2021-06-07 DIAGNOSIS — I10 Essential (primary) hypertension: Secondary | ICD-10-CM | POA: Insufficient documentation

## 2021-06-07 DIAGNOSIS — R109 Unspecified abdominal pain: Secondary | ICD-10-CM | POA: Diagnosis not present

## 2021-06-07 DIAGNOSIS — M5432 Sciatica, left side: Secondary | ICD-10-CM | POA: Insufficient documentation

## 2021-06-07 DIAGNOSIS — M5442 Lumbago with sciatica, left side: Secondary | ICD-10-CM | POA: Diagnosis not present

## 2021-06-07 DIAGNOSIS — N3001 Acute cystitis with hematuria: Secondary | ICD-10-CM | POA: Diagnosis not present

## 2021-06-07 DIAGNOSIS — Z79899 Other long term (current) drug therapy: Secondary | ICD-10-CM | POA: Diagnosis not present

## 2021-06-07 LAB — URINALYSIS, ROUTINE W REFLEX MICROSCOPIC
Bilirubin Urine: NEGATIVE
Glucose, UA: NEGATIVE mg/dL
Ketones, ur: NEGATIVE mg/dL
Nitrite: POSITIVE — AB
Protein, ur: NEGATIVE mg/dL
Specific Gravity, Urine: 1.023 (ref 1.005–1.030)
pH: 5 (ref 5.0–8.0)

## 2021-06-07 LAB — CBC
HCT: 39.8 % (ref 36.0–46.0)
Hemoglobin: 12.8 g/dL (ref 12.0–15.0)
MCH: 28.3 pg (ref 26.0–34.0)
MCHC: 32.2 g/dL (ref 30.0–36.0)
MCV: 88.1 fL (ref 80.0–100.0)
Platelets: 300 10*3/uL (ref 150–400)
RBC: 4.52 MIL/uL (ref 3.87–5.11)
RDW: 14.3 % (ref 11.5–15.5)
WBC: 8.6 10*3/uL (ref 4.0–10.5)
nRBC: 0 % (ref 0.0–0.2)

## 2021-06-07 LAB — BASIC METABOLIC PANEL
Anion gap: 7 (ref 5–15)
BUN: 17 mg/dL (ref 6–20)
CO2: 27 mmol/L (ref 22–32)
Calcium: 8.5 mg/dL — ABNORMAL LOW (ref 8.9–10.3)
Chloride: 105 mmol/L (ref 98–111)
Creatinine, Ser: 1.16 mg/dL — ABNORMAL HIGH (ref 0.44–1.00)
GFR, Estimated: 59 mL/min — ABNORMAL LOW (ref >60)
Glucose, Bld: 89 mg/dL (ref 70–99)
Potassium: 4.2 mmol/L (ref 3.5–5.1)
Sodium: 139 mmol/L (ref 135–145)

## 2021-06-07 LAB — POC URINE PREG, ED: Preg Test, Ur: NEGATIVE

## 2021-06-07 MED ORDER — SULFAMETHOXAZOLE-TRIMETHOPRIM 800-160 MG PO TABS
1.0000 | ORAL_TABLET | Freq: Once | ORAL | Status: AC
Start: 2021-06-07 — End: 2021-06-07
  Administered 2021-06-07: 1 via ORAL
  Filled 2021-06-07: qty 1

## 2021-06-07 MED ORDER — METHOCARBAMOL 750 MG PO TABS
750.0000 mg | ORAL_TABLET | Freq: Three times a day (TID) | ORAL | 0 refills | Status: DC
Start: 2021-06-07 — End: 2022-01-23

## 2021-06-07 MED ORDER — METHOCARBAMOL 500 MG PO TABS
750.0000 mg | ORAL_TABLET | Freq: Once | ORAL | Status: AC
Start: 2021-06-07 — End: 2021-06-07
  Administered 2021-06-07: 750 mg via ORAL
  Filled 2021-06-07: qty 2

## 2021-06-07 MED ORDER — ONDANSETRON 4 MG PO TBDP
4.0000 mg | ORAL_TABLET | Freq: Once | ORAL | Status: AC
Start: 2021-06-07 — End: 2021-06-07
  Administered 2021-06-07: 4 mg via ORAL
  Filled 2021-06-07: qty 1

## 2021-06-07 MED ORDER — SULFAMETHOXAZOLE-TRIMETHOPRIM 800-160 MG PO TABS: 1.0000 | ORAL_TABLET | Freq: Two times a day (BID) | ORAL | 0 refills | Status: DC

## 2021-06-07 MED ORDER — SULFAMETHOXAZOLE-TRIMETHOPRIM 800-160 MG PO TABS: 1.0000 | ORAL_TABLET | Freq: Two times a day (BID) | ORAL | 0 refills | Status: AC

## 2021-06-07 MED ORDER — OXYCODONE-ACETAMINOPHEN 5-325 MG PO TABS
2.0000 | ORAL_TABLET | Freq: Once | ORAL | Status: AC
  Administered 2021-06-07: 2 via ORAL
  Filled 2021-06-07: qty 2

## 2021-06-07 MED ORDER — OXYCODONE-ACETAMINOPHEN 5-325 MG PO TABS: 1.0000 | ORAL_TABLET | Freq: Four times a day (QID) | ORAL | 0 refills | Status: DC | PRN

## 2021-06-07 NOTE — ED Provider Triage Note (Signed)
Emergency Medicine Provider Triage Evaluation Note ? ?Tracie Green , a 46 y.o. female  was evaluated in triage.  Pt complains of left-sided flank and lower back pain for the last 5 days.  Pain radiates into left-sided abdomen in addition to down the leg.  She does have a history of kidney stones states this feels similar.  Also having urinary frequency.  No fever. ? ?Review of Systems  ?Positive:  ?Negative: See above  ? ?Physical Exam  ?BP (!) 141/94 (BP Location: Right Wrist)   Pulse 95   Temp 97.8 ?F (36.6 ?C) (Oral)   Resp 20   Ht 5\' 10"  (1.778 m)   Wt (!) 177.8 kg   SpO2 100%   BMI 56.25 kg/m?  ?Gen:   Awake, no distress   ?Resp:  Normal effort  ?MSK:   Moves extremities without difficulty  ?Other:   ? ?Medical Decision Making  ?Medically screening exam initiated at 6:20 PM.  Appropriate orders placed.  Tracie Green was informed that the remainder of the evaluation will be completed by another provider, this initial triage assessment does not replace that evaluation, and the importance of remaining in the ED until their evaluation is complete. ? ? ?  ?Tracie Mayer, PA-C ?06/07/21 1820 ? ?

## 2021-06-07 NOTE — ED Provider Notes (Signed)
[facility] EMERGENCY DEPARTMENT Provider Note   CSN: [id number] Arrival date & time: 06/07/21  1659     History  Chief Complaint  Patient presents with   Flank Pain    Tracie Green is a 46 y.o. female with a history of chronic low back pain, hypertension and frequent UTIs, also history of kidney stones presenting for evaluation of left flank pain which started about 5 days ago.  She describes helping her brother hanging sheet rock for a remodeling project and has had escalating pain in her left flank over the past 5 days.  She endorses pain that radiates into her left lateral leg to her lower calf region along with numbness and burning along the course of the lateral leg.  She denies weakness in the leg and has had no urinary incontinence or retention but does note increased urinary frequency.  She denies dysuria or hematuria.  She does have a history of being in chronic pain management when she lived in [place] secondary to her low back pain.  Her pain is worsened with movement, better at rest.  She describes bending down this morning to feed her dog and her back "locked up" and she required assistance to stand up.  She has taken ibuprofen, and OTC back and body reliever and muscle rub without improvement in her symptoms.  She denies dysuria or hematuria, denies fevers or chills.  She denies IV drug use.  The history is provided by the patient.      Home Medications Prior to Admission medications   Medication Sig Start Date End Date Taking? Authorizing Provider  methocarbamol (ROBAXIN) 750 MG tablet Take 1 tablet (750 mg total) by mouth 3 (three) times daily. 06/07/21  Yes Tamar Lipscomb, Raynelle Fanning, PA-C  oxyCODONE-acetaminophen (PERCOCET/ROXICET) 5-325 MG tablet Take 1 tablet by mouth every 6 (six) hours as needed. 06/07/21  Yes Janiel Crisostomo, Raynelle Fanning, PA-C  amLODipine (NORVASC) 5 MG tablet Take 5 mg by mouth daily.    [provider]  fluticasone (FLONASE) 50 MCG/ACT nasal spray Place 2 sprays into both  nostrils daily. 01/24/21   Couture, Cortni S, PA-C  gabapentin (NEURONTIN) 100 MG capsule Take 100 mg by mouth 3 (three) times daily.    [provider]  HYDROcodone-acetaminophen (NORCO) 7.5-325 MG per tablet Take 1 tablet by mouth 3 (three) times daily. 04/16/14   Vickki Hearing, MD  hydrOXYzine (ATARAX/VISTARIL) 25 MG tablet Take 25 mg by mouth 3 (three) times daily as needed for anxiety.    [provider]  lisinopril-hydrochlorothiazide (PRINZIDE,ZESTORETIC) 20-25 MG per tablet Take 1 tablet by mouth daily.    [provider]  naproxen (NAPROSYN) 500 MG tablet Take 1 tablet (500 mg total) by mouth 2 (two) times daily with a meal. Patient not taking: Reported on 04/16/2014 01/22/14   Triplett, Tammy, PA-C  omeprazole (PRILOSEC) 40 MG capsule Take 40 mg by mouth daily.    [provider]  predniSONE (DELTASONE) 10 MG tablet Take 6 tablets day one, 5 tablets day two, 4 tablets day three, 3 tablets day four, 2 tablets day five, then 1 tablet day six 11/07/20   Triplett, Tammy, PA-C  sulfamethoxazole-trimethoprim (BACTRIM DS) 800-160 MG tablet Take 1 tablet by mouth 2 (two) times daily for 7 days. 06/07/21 06/14/21  Burgess Amor, PA-C      Allergies    Penicillins, Morphine, and Morphine and related    Review of Systems   Review of Systems  Constitutional:  Negative for chills and fever.  Respiratory:  Negative for shortness of breath.   Cardiovascular:  Negative for chest pain and leg swelling.  Gastrointestinal:  Negative for abdominal distention, abdominal pain and constipation.  Genitourinary:  Positive for frequency. Negative for difficulty urinating, dysuria, enuresis, flank pain and urgency.  Musculoskeletal:  Positive for back pain. Negative for gait problem and joint swelling.  Skin:  Negative for rash.  Neurological:  Positive for numbness. Negative for weakness.  All other systems reviewed and are negative.  Physical Exam Updated Vital Signs BP  (!) 149/96 (BP Location: Right Wrist)    Pulse (!) 103    Temp 97.8 F (36.6 C) (Oral)    Resp 18    Ht 5\' 10"  (1.778 m)    Wt (!) 177.8 kg    SpO2 100%    BMI 56.25 kg/m  Physical Exam Vitals and nursing note reviewed.  Constitutional:      Appearance: She is well-developed.  HENT:     Head: Normocephalic.  Eyes:     Conjunctiva/sclera: Conjunctivae normal.  Cardiovascular:     Rate and Rhythm: Normal rate.     Pulses: Normal pulses.          Dorsalis pedis pulses are 2+ on the right side and 2+ on the left side.     Comments: Pedal pulses normal. Pulmonary:     Effort: Pulmonary effort is normal.  Abdominal:     General: Bowel sounds are normal. There is no distension.     Palpations: Abdomen is soft. There is no mass.  Musculoskeletal:        General: Tenderness present. Normal range of motion.     Cervical back: Normal range of motion and neck supple.     Lumbar back: Tenderness present. No swelling, edema or spasms.     Comments: Patient is tender to palpation along her left flank region to her left lateral upper thigh.  No palpable deformity or tenderness midline of her lumbar spine.  Skin:    General: Skin is warm and dry.  Neurological:     Mental Status: She is alert.     Sensory: No sensory deficit.     Motor: No tremor or atrophy.     Gait: Gait normal.     Comments: No strength deficit noted in hip and knee flexor and extensor muscle groups.  Ankle flexion and extension intact.    ED Results / Procedures / Treatments   Labs (all labs ordered are listed, but only abnormal results are displayed) Labs Reviewed  URINE CULTURE - Abnormal; Notable for the following components:      Result Value   Culture MULTIPLE SPECIES PRESENT, SUGGEST RECOLLECTION (*)    All other components within normal limits  URINALYSIS, ROUTINE W REFLEX MICROSCOPIC - Abnormal; Notable for the following components:   Color, Urine AMBER (*)    APPearance CLOUDY (*)    Hgb urine dipstick  MODERATE (*)    Nitrite POSITIVE (*)    Leukocytes,Ua MODERATE (*)    Bacteria, UA MANY (*)    All other components within normal limits  BASIC METABOLIC PANEL - Abnormal; Notable for the following components:   Creatinine, Ser 1.16 (*)    Calcium 8.5 (*)    GFR, Estimated 59 (*)    All other components within normal limits  POC URINE PREG, ED - Normal  CBC    EKG None  Radiology CT L-SPINE NO CHARGE  Result Date: 06/07/2021 CLINICAL DATA:  Left low back  pain radiating to the left leg. Symptoms over the last week. EXAM: CT LUMBAR SPINE WITHOUT CONTRAST TECHNIQUE: Multidetector CT imaging of the lumbar spine was performed without intravenous contrast administration. Multiplanar CT image reconstructions were also generated. RADIATION DOSE REDUCTION: This exam was performed according to the departmental dose-optimization program which includes automated exposure control, adjustment of the mA and/or kV according to patient size and/or use of iterative reconstruction technique. COMPARISON:  11/07/2020 FINDINGS: Segmentation: 5 lumbar type vertebral bodies. Alignment: Minimal scoliotic curvature.  2 mm retrolisthesis L2-3. Vertebrae: No fracture or focal bone lesion. Paraspinal and other soft tissues: Nonobstructing stones in the left kidney. Disc levels: Limited detail because of body habitus. No significant finding at L1-2 or above. L2-3: Disc degeneration with vacuum phenomenon. 2 mm of retrolisthesis. Stenosis of the lateral recesses and foramina at this level that could be symptomatic. L3-4: Mild bulging of the disc. Mild facet hypertrophy. No compressive stenosis. L4-5: Limited detail. No apparent bony stenosis of the canal or foramina. L5-S1: Limited detail. No apparent bony stenosis of the canal or foramina. Facet osteoarthritis that could contribute to back pain. IMPRESSION: Limited detail, especially in the lower lumbar spine, because of body habitus. L2-3: Disc degeneration with vacuum  phenomenon. 2 mm retrolisthesis. Stenosis of the lateral recesses and foramina that could possibly cause neural compression. No bony stenosis noted at L3-4, L4-5 or L5-S1. There is mild facet osteoarthritis at L5-S1. Electronically Signed   By: Paulina Fusi M.D.   On: 06/07/2021 20:54   CT Renal Stone Study  Result Date: 06/07/2021 CLINICAL DATA:  Left flank and back pain with left lower extremity radiation. EXAM: CT ABDOMEN AND PELVIS WITHOUT CONTRAST TECHNIQUE: Multidetector CT imaging of the abdomen and pelvis was performed following the standard protocol without IV contrast. RADIATION DOSE REDUCTION: This exam was performed according to the departmental dose-optimization program which includes automated exposure control, adjustment of the mA and/or kV according to patient size and/or use of iterative reconstruction technique. COMPARISON:  CT without contrast 11/07/2020 FINDINGS: Lower chest: No acute findings. Linear pleurodiaphragmatic scarring noted posteriorly in the right lower lobe. The cardiac size is normal. Hepatobiliary: 22 cm in length mildly steatotic liver. No focal lesion is seen without contrast. Gallbladder is absent as before. No biliary dilatation. Pancreas: Unremarkable without contrast. Spleen: Normal in size and unremarkable without contrast. Adrenals/Urinary Tract: There is no adrenal mass. No focal abnormality in the unenhanced renal cortex. There is a small cyst laterally in the left kidney. There is a 2 mm nonobstructive caliceal stone in the upper pole left kidney and several tiny punctate nonobstructive caliceal stones in both kidneys, but no ureteral stone or hydronephrosis is seen. The bladder is not well seen due to beam hardening artifact in the pelvis owing to patient body habitus, but no obvious abnormality is noted or intravesical stones. There are multiple pelvic phleboliths. Stomach/Bowel: Small hiatal hernia. No dilatation or wall thickening including the appendix.  Scattered sigmoid diverticula without acute inflammatory change. Vascular/Lymphatic: No significant vascular findings are present. No enlarged abdominal or pelvic lymph nodes. Reproductive: Intact uterus. No adnexal mass is seen. The ovaries are not enlarged. Other: Small umbilical and right inguinal fat hernias. No free air, hemorrhage or fluid. Musculoskeletal: Degenerative change thoracic and lumbar spine with chronic wedging of T9-11 vertebral bodies. Mild hip DJD. No acute or other significant osseous findings. IMPRESSION: 1. No acute noncontrast CT abnormality or interval changes. 2. Nonobstructive micronephrolithiasis. 3. Mildly prominent steatotic liver. 4. Diverticula without evidence of  diverticulitis. 5. Umbilical and right inguinal fat hernias. Electronically Signed   By: Almira Bar M.D.   On: 06/07/2021 20:54    Procedures Procedures    Medications Ordered in ED Medications  oxyCODONE-acetaminophen (PERCOCET/ROXICET) 5-325 MG per tablet 2 tablet (2 tablets Oral Given 06/07/21 2153)  methocarbamol (ROBAXIN) tablet 750 mg (750 mg Oral Given 06/07/21 2153)  sulfamethoxazole-trimethoprim (BACTRIM DS) 800-160 MG per tablet 1 tablet (1 tablet Oral Given 06/07/21 2153)  ondansetron (ZOFRAN-ODT) disintegrating tablet 4 mg (4 mg Oral Given 06/07/21 2300)    ED Course/ Medical Decision Making/ A&P                           Medical Decision Making No neuro deficit on exam or by history to suggest emergent or surgical presentation.  Uti present, no Also discussed worsened sx that should prompt immediate re-evaluation including distal weakness, bowel/bladder retention/incontinence.  Microlithiasis on CT imaging, no ureteral stones.  No sign of pyelonephritis.  ABX started, tx of low back pain with oxycodone, robaxin after review of [place] narc database. Return precautions given, referral to pcp for establishment of primary care givne.         Amount and/or Complexity of Data Reviewed Labs:  ordered.    Details: uti, otherwise stable Radiology: ordered.    Details: per above,  Risk OTC drugs. Prescription drug management. Decision regarding hospitalization. Risk Details: No indication for hospitalization.           Final Clinical Impression(s) / ED Diagnoses Final diagnoses:  Sciatica of left side  Acute cystitis with hematuria    Rx / DC Orders ED Discharge Orders          Ordered    oxyCODONE-acetaminophen (PERCOCET/ROXICET) 5-325 MG tablet  Every 6 hours PRN        06/07/21 2249    methocarbamol (ROBAXIN) 750 MG tablet  3 times daily        06/07/21 2249    sulfamethoxazole-trimethoprim (BACTRIM DS) 800-160 MG tablet  2 times daily,   Status:  Discontinued        06/07/21 2249    sulfamethoxazole-trimethoprim (BACTRIM DS) 800-160 MG tablet  2 times daily        06/07/21 2254              Burgess Amor, PA-C 06/09/21 1252    Gloris Manchester, MD 06/11/21 1126

## 2021-06-07 NOTE — ED Triage Notes (Signed)
Left flank pain x 5 days, history of kidney stones , states pain radiates into left leg ?

## 2021-06-07 NOTE — Discharge Instructions (Signed)
Take the medicines as directed.  Do not drive within 4 hours of taking oxycodone as this will make you drowsy.  Avoid lifting,  Bending,  Twisting or any other activity that worsens your pain over the next week.  Apply a heating pad  to your lower back for 20 minutes, 3-4 times daily.  You should get rechecked if your symptoms are not better over the next 5 days,  Or you develop increased pain,  Weakness in your leg(s) or loss of bladder or bowel function - these are potential symptoms of a worsening low back problem.   ? ?You have also been prescribed an antibiotic for your urinary tract infection.  Take the entire course of this medication.  Make sure you are drinking plenty of fluids. ?

## 2021-06-07 NOTE — ED Notes (Signed)
Patient transported to CT 

## 2021-06-09 LAB — URINE CULTURE

## 2021-07-31 ENCOUNTER — Emergency Department (HOSPITAL_COMMUNITY)
Admission: EM | Admit: 2021-07-31 | Discharge: 2021-08-01 | Disposition: A | Discharge status: Home / Self Care | Payer: 59 | Attending: Emergency Medicine | Admitting: Emergency Medicine

## 2021-07-31 ENCOUNTER — Other Ambulatory Visit: Payer: Self-pay

## 2021-07-31 DIAGNOSIS — J069 Acute upper respiratory infection, unspecified: Secondary | ICD-10-CM | POA: Insufficient documentation

## 2021-07-31 DIAGNOSIS — M5442 Lumbago with sciatica, left side: Secondary | ICD-10-CM | POA: Diagnosis not present

## 2021-07-31 DIAGNOSIS — R0602 Shortness of breath: Secondary | ICD-10-CM | POA: Diagnosis not present

## 2021-07-31 DIAGNOSIS — N3000 Acute cystitis without hematuria: Secondary | ICD-10-CM | POA: Insufficient documentation

## 2021-07-31 DIAGNOSIS — Z20822 Contact with and (suspected) exposure to covid-19: Secondary | ICD-10-CM | POA: Insufficient documentation

## 2021-07-31 DIAGNOSIS — R059 Cough, unspecified: Secondary | ICD-10-CM | POA: Diagnosis not present

## 2021-08-01 ENCOUNTER — Emergency Department (HOSPITAL_COMMUNITY): Payer: 59

## 2021-08-01 ENCOUNTER — Telehealth (HOSPITAL_COMMUNITY): Payer: Self-pay | Admitting: Emergency Medicine

## 2021-08-01 ENCOUNTER — Encounter (HOSPITAL_COMMUNITY): Payer: Self-pay

## 2021-08-01 DIAGNOSIS — R059 Cough, unspecified: Secondary | ICD-10-CM | POA: Diagnosis not present

## 2021-08-01 DIAGNOSIS — R0602 Shortness of breath: Secondary | ICD-10-CM | POA: Diagnosis not present

## 2021-08-01 LAB — URINALYSIS, ROUTINE W REFLEX MICROSCOPIC
Bilirubin Urine: NEGATIVE
Glucose, UA: NEGATIVE mg/dL
Hgb urine dipstick: NEGATIVE
Ketones, ur: NEGATIVE mg/dL
Leukocytes,Ua: NEGATIVE
Nitrite: POSITIVE — AB
Protein, ur: NEGATIVE mg/dL
Specific Gravity, Urine: 1.02 (ref 1.005–1.030)
pH: 6 (ref 5.0–8.0)

## 2021-08-01 LAB — RESP PANEL BY RT-PCR (FLU A&B, COVID) ARPGX2
Influenza A by PCR: NEGATIVE
Influenza B by PCR: NEGATIVE
SARS Coronavirus 2 by RT PCR: NEGATIVE

## 2021-08-01 MED ORDER — LEVOFLOXACIN 500 MG PO TABS: 500.0000 mg | ORAL_TABLET | Freq: Every day | ORAL | 0 refills | Status: DC

## 2021-08-01 MED ORDER — BENZONATATE 100 MG PO CAPS
100.0000 mg | ORAL_CAPSULE | Freq: Once | ORAL | Status: AC
  Administered 2021-08-01: 100 mg via ORAL
  Filled 2021-08-01: qty 1

## 2021-08-01 MED ORDER — BENZONATATE 100 MG PO CAPS: 200.0000 mg | ORAL_CAPSULE | Freq: Three times a day (TID) | ORAL | 0 refills | Status: DC | PRN

## 2021-08-01 MED ORDER — BENZONATATE 100 MG PO CAPS: 100.0000 mg | ORAL_CAPSULE | Freq: Three times a day (TID) | ORAL | 0 refills | Status: DC | PRN

## 2021-08-01 MED ORDER — LEVOFLOXACIN 500 MG PO TABS
500.0000 mg | ORAL_TABLET | Freq: Once | ORAL | Status: AC
Start: 2021-08-01 — End: 2021-08-01
  Administered 2021-08-01: 500 mg via ORAL
  Filled 2021-08-01: qty 1

## 2021-08-01 MED ORDER — NITROFURANTOIN MACROCRYSTAL 100 MG PO CAPS
100.0000 mg | ORAL_CAPSULE | Freq: Once | ORAL | Status: DC
Start: 2021-08-01 — End: 2021-08-01
  Filled 2021-08-01: qty 1

## 2021-08-01 NOTE — ED Triage Notes (Signed)
Pov from home. Cc of cough and fever since Wednesday. Does not have pcp. Has been taking otc cough/congestion meds. Has not taken any tylenol/ibuprofen since Sunday morning. Reports a fever but has not been taking her temp.  ?

## 2021-08-01 NOTE — ED Provider Notes (Signed)
?[facility] EMERGENCY DEPARTMENT ?Provider Note ? ? ?CSN: [id number] ?Arrival date & time: 07/31/21  2315 ? ?  ? ?History ? ?Chief Complaint  ?Patient presents with  ? Cough  ? ? ?Tracie Green is a 46 y.o. female. ? ?46 year old female who presents to the ER today secondary to multiple days of productive cough with brown sputum.  Has not checked her temperature but has felt febrile.  States she also has chest congestion and sinus congestion.  States last time she felt like this she was treated for community-acquired pneumonia.  Has tried a few over-the-counter medications which have not seemed to help. ? ?Also related increased urinary frequency and dysuria.  ? ? ?Cough ? ?  ? ?Home Medications ?Prior to Admission medications   ?Medication Sig Start Date End Date Taking? Authorizing Provider  ?benzonatate (TESSALON) 100 MG capsule Take 1 capsule (100 mg total) by mouth 3 (three) times daily as needed for cough. 08/01/21  Yes Chelsye Suhre, Barbara Cower, MD  ?levofloxacin (LEVAQUIN) 500 MG tablet Take 1 tablet (500 mg total) by mouth daily. 08/01/21  Yes Geneva Barrero, Barbara Cower, MD  ?amLODipine (NORVASC) 5 MG tablet Take 5 mg by mouth daily.    [provider]  ?fluticasone (FLONASE) 50 MCG/ACT nasal spray Place 2 sprays into both nostrils daily. 01/24/21   Couture, Cortni S, PA-C  ?gabapentin (NEURONTIN) 100 MG capsule Take 100 mg by mouth 3 (three) times daily.    [provider]  ?HYDROcodone-acetaminophen (NORCO) 7.5-325 MG per tablet Take 1 tablet by mouth 3 (three) times daily. 04/16/14   Vickki Hearing, MD  ?hydrOXYzine (ATARAX/VISTARIL) 25 MG tablet Take 25 mg by mouth 3 (three) times daily as needed for anxiety.    [provider]  ?lisinopril-hydrochlorothiazide (PRINZIDE,ZESTORETIC) 20-25 MG per tablet Take 1 tablet by mouth daily.    [provider]  ?methocarbamol (ROBAXIN) 750 MG tablet Take 1 tablet (750 mg total) by mouth 3 (three) times daily. 06/07/21   Burgess Amor, PA-C  ?naproxen  (NAPROSYN) 500 MG tablet Take 1 tablet (500 mg total) by mouth 2 (two) times daily with a meal. ?Patient not taking: Reported on 04/16/2014 01/22/14   Pauline Aus, PA-C  ?omeprazole (PRILOSEC) 40 MG capsule Take 40 mg by mouth daily.    [provider]  ?oxyCODONE-acetaminophen (PERCOCET/ROXICET) 5-325 MG tablet Take 1 tablet by mouth every 6 (six) hours as needed. 06/07/21   Burgess Amor, PA-C  ?predniSONE (DELTASONE) 10 MG tablet Take 6 tablets day one, 5 tablets day two, 4 tablets day three, 3 tablets day four, 2 tablets day five, then 1 tablet day six 11/07/20   Pauline Aus, PA-C  ?   ? ?Allergies    ?Penicillins, Morphine, and Morphine and related   ? ?Review of Systems   ?Review of Systems  ?Respiratory:  Positive for cough.   ? ?Physical Exam ?Updated Vital Signs ?BP (!) 162/114   Pulse 92   Temp 98.8 ?F (37.1 ?C)   Resp 19   Ht 5\' 8"  (1.727 m)   Wt (!) 178 kg   LMP 07/14/2021 (Approximate)   SpO2 100%   BMI 59.67 kg/m?  ?Physical Exam ?Vitals and nursing note reviewed.  ?Constitutional:   ?   Appearance: She is well-developed.  ?HENT:  ?   Head: Normocephalic and atraumatic.  ?   Mouth/Throat:  ?   Mouth: Mucous membranes are moist.  ?   Pharynx: Oropharynx is clear.  ?Eyes:  ?   Pupils: Pupils are equal,  round, and reactive to light.  ?Cardiovascular:  ?   Rate and Rhythm: Normal rate and regular rhythm.  ?Pulmonary:  ?   Effort: No respiratory distress.  ?   Breath sounds: No stridor.  ?Abdominal:  ?   General: Abdomen is flat. There is no distension.  ?Musculoskeletal:     ?   General: No swelling or tenderness. Normal range of motion.  ?   Cervical back: Normal range of motion.  ?   Comments: Patient feels that her legs are swollen however this is difficult to ascertain on exam.  Definitely no pitting  ?Skin: ?   General: Skin is warm and dry.  ?Neurological:  ?   General: No focal deficit present.  ?   Mental Status: She is alert.  ? ? ?ED Results / Procedures / Treatments    ?Labs ?(all labs ordered are listed, but only abnormal results are displayed) ?Labs Reviewed  ?URINALYSIS, ROUTINE W REFLEX MICROSCOPIC - Abnormal; Notable for the following components:  ?    Result Value  ? Nitrite POSITIVE (*)   ? Bacteria, UA RARE (*)   ? All other components within normal limits  ?RESP PANEL BY RT-PCR (FLU A&B, COVID) [id number]  ?URINE CULTURE  ? ? ?EKG ?None ? ?Radiology ?DG Chest 2 View ? ?Result Date: 08/01/2021 ?CLINICAL DATA:  Cough, shortness of breath EXAM: CHEST - 2 VIEW COMPARISON:  01/24/2021 FINDINGS: Cardiac and mediastinal contours are within normal limits. No focal pulmonary opacity. No pleural effusion or pneumothorax. No acute osseous abnormality. IMPRESSION: No acute cardiopulmonary process. Electronically Signed   By: Wiliam Ke M.D.   On: 08/01/2021 01:15   ? ?Procedures ?Procedures  ? ? ?Medications Ordered in ED ?Medications  ?benzonatate (TESSALON) capsule 100 mg (100 mg Oral Given 08/01/21 0140)  ?levofloxacin (LEVAQUIN) tablet 500 mg (500 mg Oral Given 08/01/21 0312)  ? ? ?ED Course/ Medical Decision Making/ A&P ?  ?                        ?Medical Decision Making ?Amount and/or Complexity of Data Reviewed ?Labs: ordered. ?Radiology: ordered. ? ?Risk ?Prescription drug management. ? ?Evaluate for viral versus bacterial bronchitis.  We will attempt symptomatic treatment the meantime. ? ?No obvious pneumonia but with likely UTI, will treat for both bacterial respiratory infection and UTI as well.  ? ?Final Clinical Impression(s) / ED Diagnoses ?Final diagnoses:  ?Upper respiratory tract infection, unspecified type  ?Acute cystitis without hematuria  ? ? ?Rx / DC Orders ?ED Discharge Orders   ? ?      Ordered  ?  levofloxacin (LEVAQUIN) 500 MG tablet  Daily       ? 08/01/21 0302  ?  benzonatate (TESSALON) 100 MG capsule  3 times daily PRN       ? 08/01/21 0302  ? ?  ?  ? ?  ? ? ?  ?Marily Memos, MD ?08/01/21 [phone] ? ?

## 2021-08-01 NOTE — Telephone Encounter (Signed)
Repeat attempt to send prescriptions to the correct pharmacy. [facility] in [place], [place] ?

## 2021-08-01 NOTE — Telephone Encounter (Signed)
Pt called in stating prescriptions given earlier sent to wrong pharmacy.  Per RN,  correct pharmacy added.  Re-escribed meds including tessalon and levaquin ?

## 2021-08-03 LAB — URINE CULTURE: Culture: >=100000 — AB

## 2021-08-04 ENCOUNTER — Telehealth: Payer: Self-pay | Admitting: *Deleted

## 2021-08-04 NOTE — Telephone Encounter (Signed)
Post ED Visit - Positive Culture Follow-up ? ?Culture report reviewed by antimicrobial stewardship pharmacist: ?[facility] ?[]  , Enzo Bi.D. ?[]  [address], Pharm.D., BCPS AQ-ID ?[]  , Pharm.D., BCPS ?[]  Celedonio Miyamoto, .D., BCPS ?[]  [place], .D., BCPS, AAHIVP ?[]  Georgina Pillion, Pharm.D., BCPS, AAHIVP ?[]  [address], PharmD, BCPS ?[]  , PharmD, BCPS ?[]  [place], PharmD, BCPS ?[]  [address], PharmD ?[]  , PharmD, BCPS ?[]  Estella Husk, PharmD ? ? [facility] ?[]  Lysle Pearl, PharmD ?[]  , PharmD ?[]  Phillips Climes, PharmD ?[]  , Rph ?[]  Agapito Games) , PharmD ?[]  Verlan Friends, PharmD ?[]  , PharmD ?[]  Mervyn Gay, PharmD ?[]  , PharmD ?[]  Vinnie Level, PharmD ?[]  Gerri Spore, PharmD ?[]  , PharmD ?[]  Len Childs, PharmD ? ? ?Positive urine culture ?Treated with Levofloxacin, organism sensitive to the same and no further patient follow-up is required at this time.  , PharmD ? ?Greer Pickerel ?08/04/2021, 7:53 AM ?  ?

## 2021-11-04 IMAGING — DX DG CHEST 1V PORT
1 series · 1 of 1 positions shown · non-contrast
Comparison: 03/25/2010

CLINICAL DATA: Chest pain

EXAM:
PORTABLE CHEST 1 VIEW

[chest ap grid]
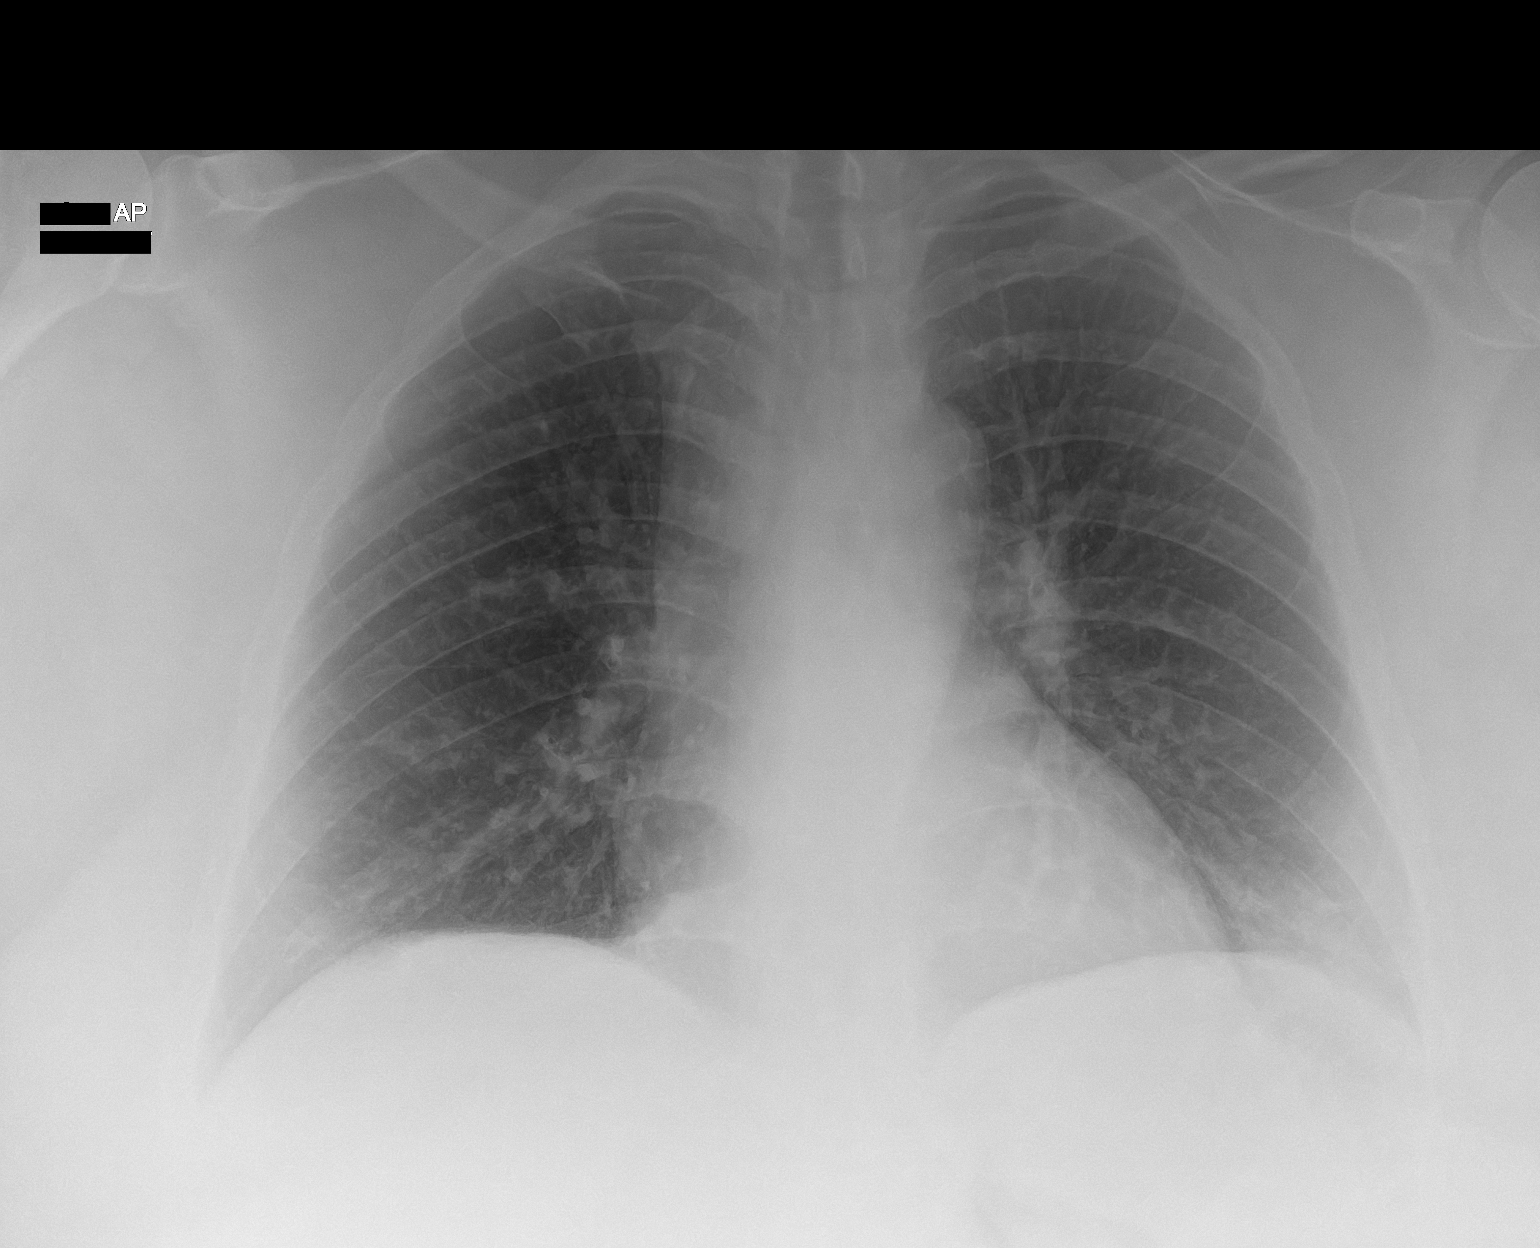

[1 of 1 positions shown; findings below may reference images not displayed]

FINDINGS: Patchy opacity left base. No pleural effusion. Normal
cardiomediastinal silhouette. No pneumothorax
IMPRESSION: Mild patchy left lung base opacity may reflect atelectasis or mild
pneumonia

## 2021-11-05 IMAGING — CT CT ANGIO CHEST
2 of 6 series · 18 of 46 positions shown · IV contrast (Omnipaque or Isovue)
Comparison: Chest CTA 06/26/2008.

CLINICAL DATA: 45-year-old female with history of chest pain. COVID
positive. Evaluate for pulmonary embolism. Emesis and cough.

EXAM:
CT ANGIOGRAPHY CHEST WITH CONTRAST
TECHNIQUE: Multidetector CT imaging of the chest was performed using the
standard protocol during bolus administration of intravenous
contrast. Multiplanar CT image reconstructions and MIPs were
obtained to evaluate the vascular anatomy.
CONTRAST:  100mL OMNIPAQUE IOHEXOL 350 MG/ML SOLN

[Series 5: pe axial thins · axial · 0.63mm/px · z∈[+654,+874]mm · 15 of 301 slices shown]
[im 13/301  lung]
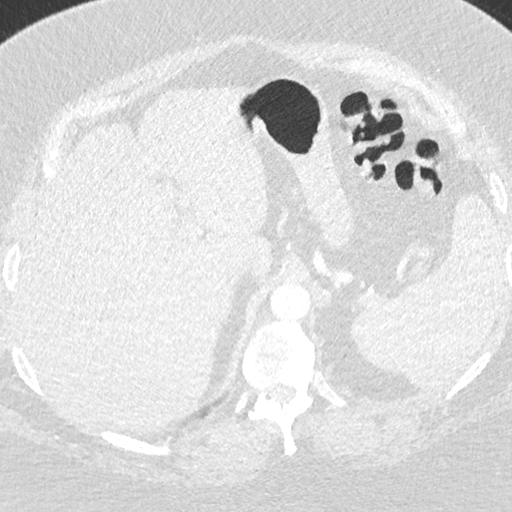
[im 38/301  soft-tissue]
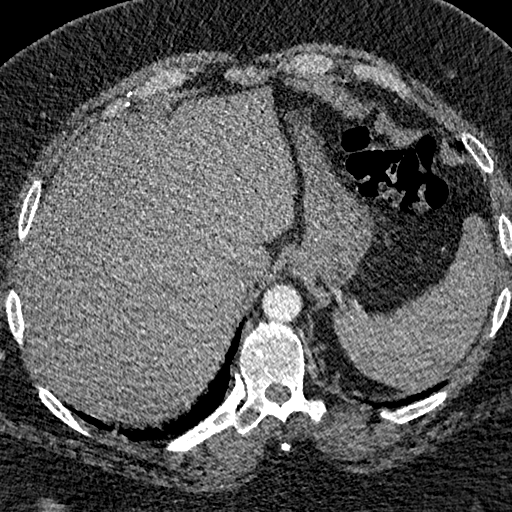
[im 51/301  lung]
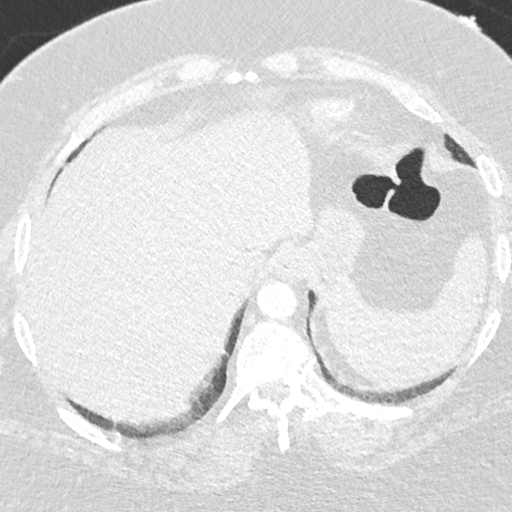
[im 76/301  soft-tissue]
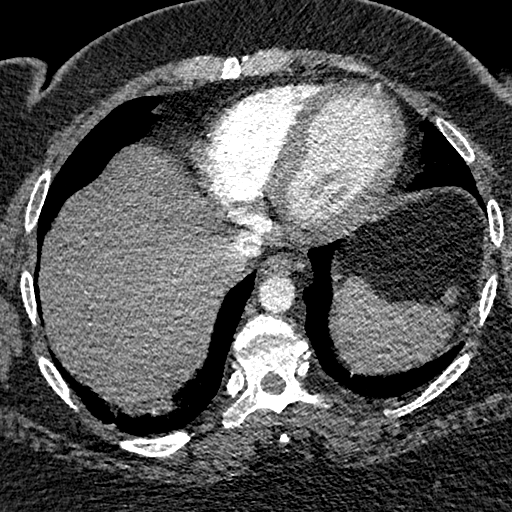
[im 88/301  lung]
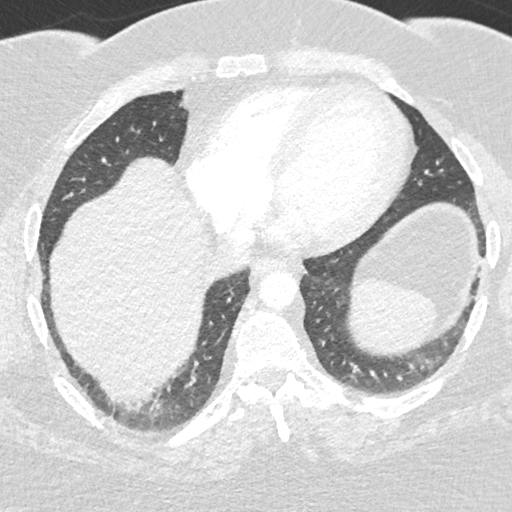
[im 113/301  soft-tissue]
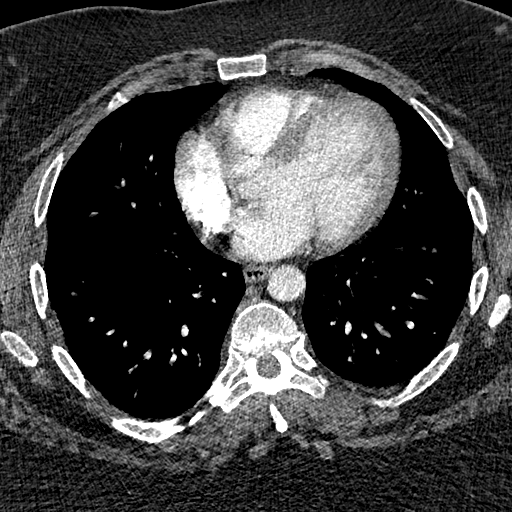
[im 126/301  lung]
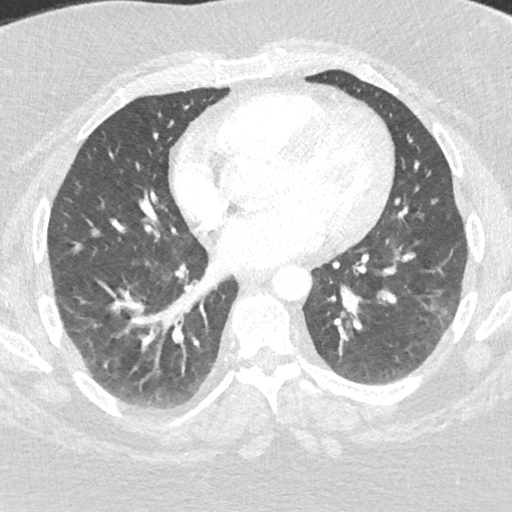
[im 151/301  soft-tissue]
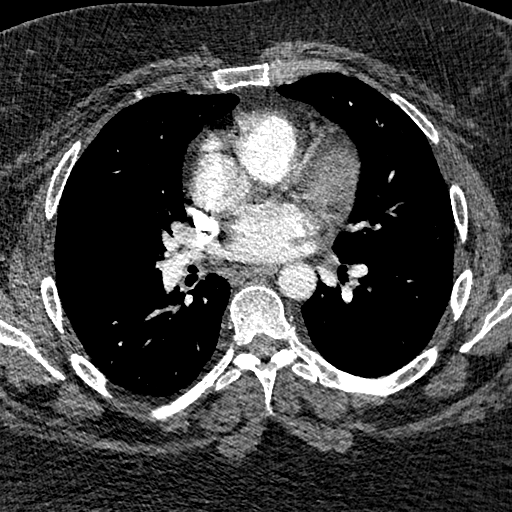
[im 176/301  lung]
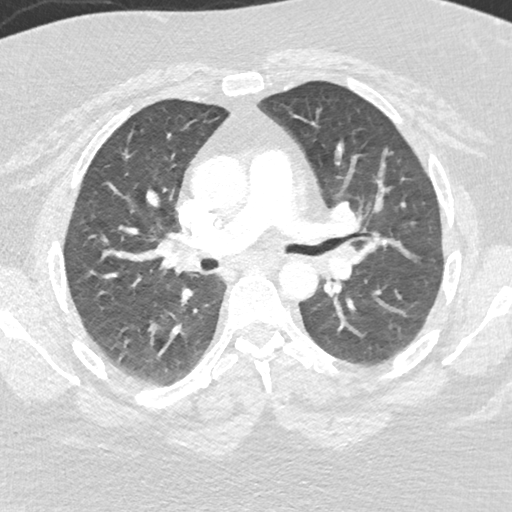
[im 188/301  soft-tissue]
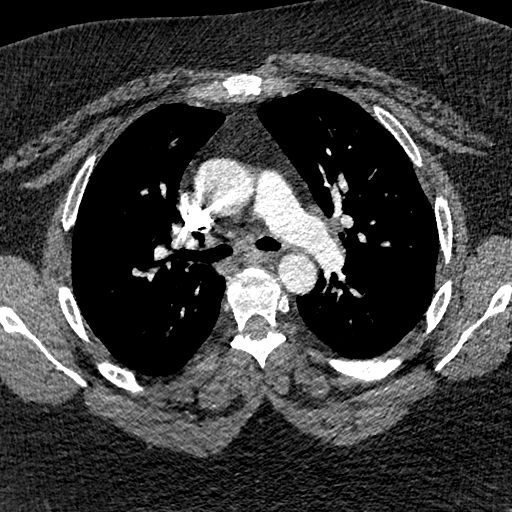
[im 213/301  lung]
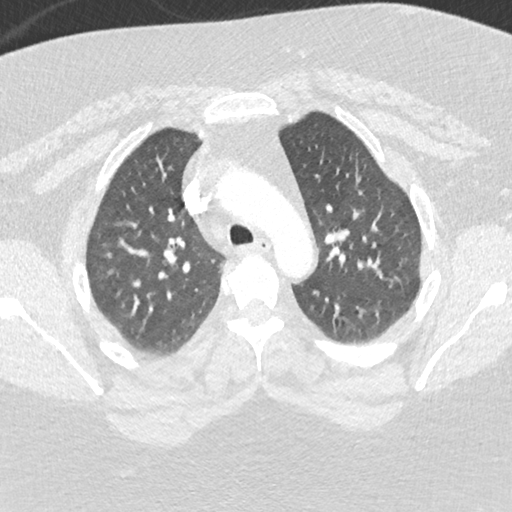
[im 226/301  soft-tissue]
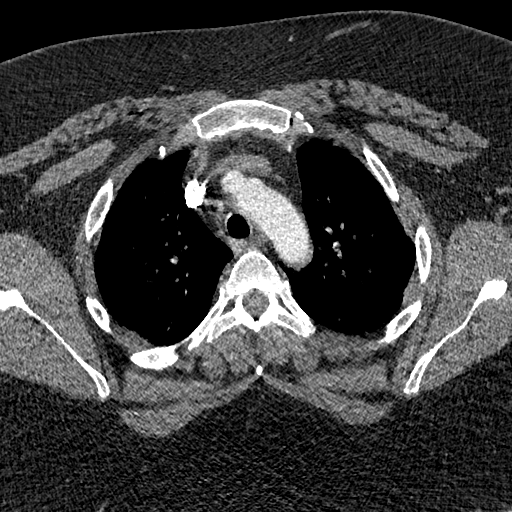
[im 251/301  lung]
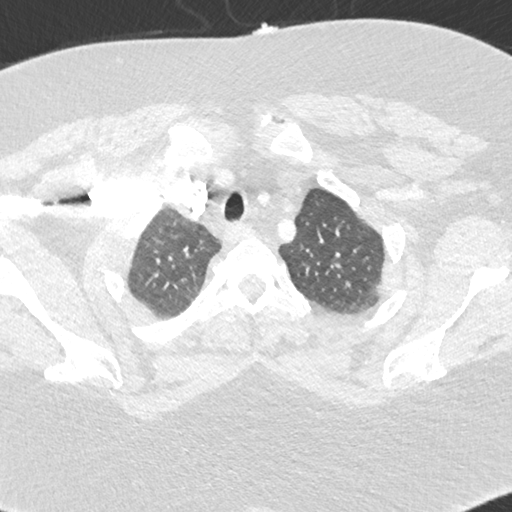
[im 263/301  soft-tissue]
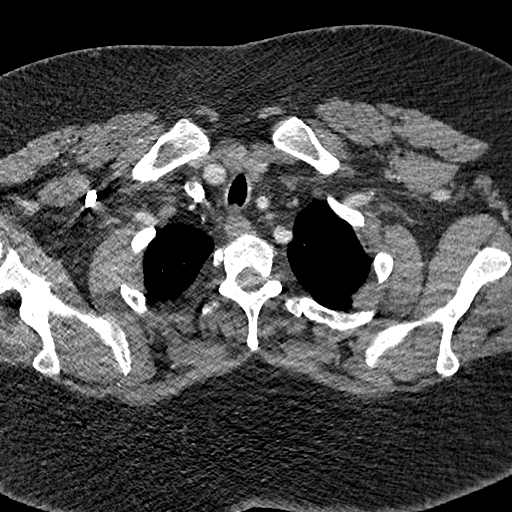
[im 288/301  lung]
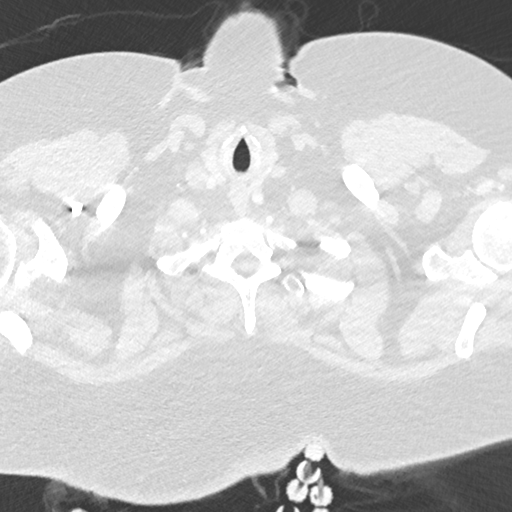

[Series 8: cor soft · coronal · 0.52mm/px · 3 of 151 slices shown]
[im 38/151  soft-tissue]
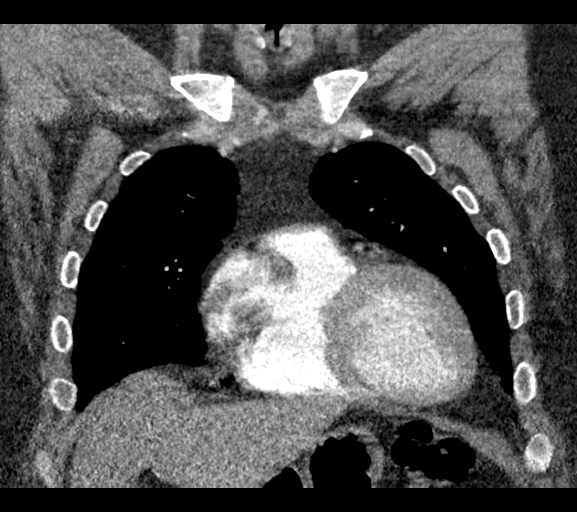
[im 76/151  soft-tissue]
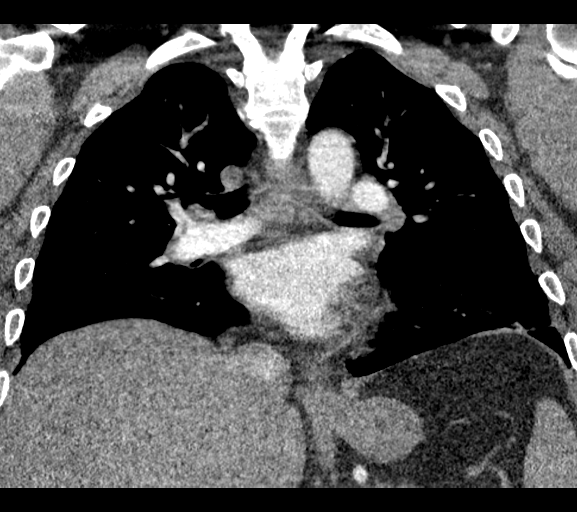
[im 113/151  soft-tissue]
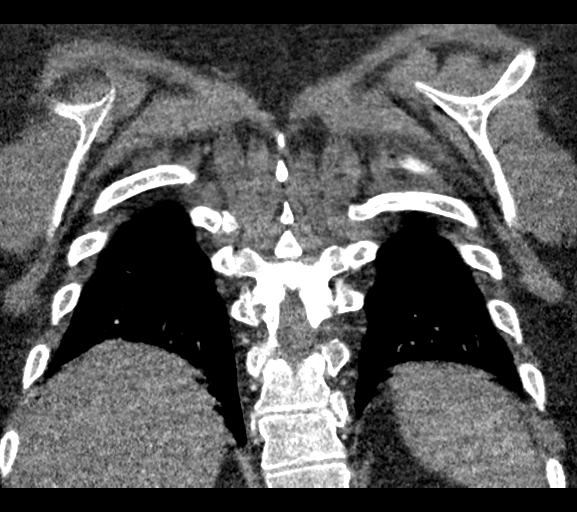

[18 of 46 positions shown; findings below may reference images not displayed]

FINDINGS: Cardiovascular: Study is slightly limited by suboptimal contrast
bolus and patient respiratory motion. With these limitations in
mind, there is no evidence to suggest clinically relevant central,
lobar or segmental sized pulmonary embolism. Smaller subsegmental
sized pulmonary embolism cannot be entirely excluded on the basis of
today's examination. Heart size is borderline enlarged. There is no
significant pericardial fluid, thickening or pericardial
calcification. No atherosclerotic calcifications are noted in the
thoracic aorta or the coronary arteries.

Mediastinum/Nodes: No pathologically enlarged mediastinal or hilar
lymph nodes. Esophagus is unremarkable in appearance. No axillary
lymphadenopathy.

Lungs/Pleura: No suspicious pulmonary nodules or masses are noted.
No acute consolidative airspace disease. No pleural effusions.
Minimal bibasilar subsegmental atelectasis.

Upper Abdomen: Unremarkable.

Musculoskeletal: There are no aggressive appearing lytic or blastic
lesions noted in the visualized portions of the skeleton.

Review of the MIP images confirms the above findings.
IMPRESSION: 1. Despite the limitations of today's examination, there is no
evidence to suggest clinically relevant central, lobar or segmental
sized pulmonary embolism.
2. No acute findings in the thorax to account for the patient's
symptoms.

## 2021-11-29 DIAGNOSIS — K5900 Constipation, unspecified: Secondary | ICD-10-CM | POA: Diagnosis not present

## 2021-12-23 ENCOUNTER — Encounter: Payer: Self-pay | Admitting: Family Medicine

## 2021-12-23 ENCOUNTER — Ambulatory Visit (INDEPENDENT_AMBULATORY_CARE_PROVIDER_SITE_OTHER): Payer: 59 | Admitting: Family Medicine

## 2021-12-23 VITALS — BP 142/82 | HR 101 | Ht 70.0 in | Wt 387.1 lb

## 2021-12-23 DIAGNOSIS — R7301 Impaired fasting glucose: Secondary | ICD-10-CM

## 2021-12-23 DIAGNOSIS — J302 Other seasonal allergic rhinitis: Secondary | ICD-10-CM | POA: Diagnosis not present

## 2021-12-23 DIAGNOSIS — Z1211 Encounter for screening for malignant neoplasm of colon: Secondary | ICD-10-CM

## 2021-12-23 DIAGNOSIS — Z113 Encounter for screening for infections with a predominantly sexual mode of transmission: Secondary | ICD-10-CM

## 2021-12-23 DIAGNOSIS — R1013 Epigastric pain: Secondary | ICD-10-CM

## 2021-12-23 DIAGNOSIS — K219 Gastro-esophageal reflux disease without esophagitis: Secondary | ICD-10-CM

## 2021-12-23 DIAGNOSIS — E559 Vitamin D deficiency, unspecified: Secondary | ICD-10-CM | POA: Diagnosis not present

## 2021-12-23 DIAGNOSIS — I10 Essential (primary) hypertension: Secondary | ICD-10-CM

## 2021-12-23 DIAGNOSIS — Z2821 Immunization not carried out because of patient refusal: Secondary | ICD-10-CM

## 2021-12-23 DIAGNOSIS — J3089 Other allergic rhinitis: Secondary | ICD-10-CM

## 2021-12-23 DIAGNOSIS — Z114 Encounter for screening for human immunodeficiency virus [HIV]: Secondary | ICD-10-CM

## 2021-12-23 DIAGNOSIS — R69 Illness, unspecified: Secondary | ICD-10-CM | POA: Diagnosis not present

## 2021-12-23 DIAGNOSIS — B372 Candidiasis of skin and nail: Secondary | ICD-10-CM | POA: Diagnosis not present

## 2021-12-23 DIAGNOSIS — N898 Other specified noninflammatory disorders of vagina: Secondary | ICD-10-CM

## 2021-12-23 MED ORDER — LEVOCETIRIZINE DIHYDROCHLORIDE 5 MG PO TABS: 5.0000 mg | ORAL_TABLET | Freq: Every evening | ORAL | 0 refills | Status: DC

## 2021-12-23 MED ORDER — FLUTICASONE PROPIONATE 50 MCG/ACT NA SUSP: 2.0000 | Freq: Every day | NASAL | 0 refills | Status: DC

## 2021-12-23 MED ORDER — CLOTRIMAZOLE 1 % EX CREA: 1.0000 | TOPICAL_CREAM | Freq: Two times a day (BID) | CUTANEOUS | 0 refills | Status: DC

## 2021-12-23 MED ORDER — PANTOPRAZOLE SODIUM 20 MG PO TBEC: 20.0000 mg | DELAYED_RELEASE_TABLET | Freq: Every day | ORAL | 0 refills | Status: DC

## 2021-12-23 MED ORDER — LISINOPRIL-HYDROCHLOROTHIAZIDE 20-25 MG PO TABS: 1.0000 | ORAL_TABLET | Freq: Every day | ORAL | 1 refills | Status: DC

## 2021-12-23 NOTE — Patient Instructions (Addendum)
I appreciate the opportunity to provide care to you today!    Follow up:  1 months  Labs: please stop by the lab during the week to get your blood drawn (CBC, CMP, TSH, Lipid profile, HgA1c, Vit D)  Screening: HIV    Please pick up your medications at the pharmacy  Lifestyle changes for GERD:  Avoiding foods that trigger symptoms -  Avoid certain foods and drinks, such as coffee, chocolate, onions, peppermint, spicy foods, carbonated beverages, citrus fruits, tomatoes, onions, Garlic, alcohol, Fatty foods (bacon, burgers, sausages, steak, fried foods, dairy food)    Recommended: High fiber foods, whole grain cereal, oatmeal, brown rice, root vegetables, non- citrus fruits, High protein foods, Health fats (avocados, olive oil, nuts and seeds)     Please continue to a heart-healthy diet and increase your physical activities. Try to exercise for 38mins at least three times a week.      It was a pleasure to see you and I look forward to continuing to work together on your health and well-being. Please do not hesitate to call the office if you need care or have questions about your care.   Have a wonderful day and week. With Gratitude, Alvira Monday MSN, FNP-BC

## 2021-12-23 NOTE — Progress Notes (Unsigned)
New Patient Office Visit  Subjective:  Patient ID: Tracie Green, female    DOB: 07/23/1975  Age: 46 y.o. MRN: [id number]  CC:  Chief Complaint  Patient presents with   Establish Care    New patient, has not had a PCP before, c/o pain in the center of her abdomen, causing bad headaches, has white discharge out of her belly button began 12/05/21. Will come back for pap on another visit.     HPI Tracie Green is a 46 y.o. female with past medical history of HTN presents for establishing care.  HTN: elevated BP in the clinic. She reports occasional headaches and dizziness, noting to be out of her BP medication. She notes taking Zestoretic 20-25 mg daily.  Abdominal pain:  c/o of generalized abdominal pain that worsens with oral intake of food. She notes that the pain is sharp and constant and rates the pain 9/10. She mentions having epigastric pain that radiates to her flank. She denies nausea, vomiting, changes in stool habits, fever, and malaise.  Allergic Rhinitis: she c/o nasal congestion, rhinorrhea, sinus pain and pressure, sneezing, and sore throat for a week. She denies SOB, cough, and systemic symptoms.  Candida Intertrigo: she c/o a white discharge at the umbilicus, noting that the site is tender to palpation and erythematous. Onset of symptoms for 1 week  Vaginal discharge: she c/o abnormal white malodorous vaginal that started two weeks ago. She denies pruritus, vaginal soreness, dyspareunia, external vulvar burning, and external dysuria.    Past Medical History:  Diagnosis Date   Hypertension    PE (pulmonary embolism)     Past Surgical History:  Procedure Laterality Date   CESAREAN SECTION     CHOLECYSTECTOMY  1998   INCISION AND DRAINAGE      History reviewed. No pertinent family history.  Social History   Socioeconomic History   Marital status: Married    Spouse name: Not on file   Number of children: Not on file   Years of education: Not on file   Highest  education level: Not on file  Occupational History   Not on file  Tobacco Use   Smoking status: Never   Smokeless tobacco: Never  Substance and Sexual Activity   Alcohol use: No   Drug use: No   Sexual activity: Not on file  Other Topics Concern   Not on file  Social History Narrative   Not on file   Social Determinants of Health   Financial Resource Strain: Not on file  Food Insecurity: Not on file  Transportation Needs: Not on file  Physical Activity: Not on file  Stress: Not on file  Social Connections: Not on file  Intimate Partner Violence: Not on file    ROS Review of Systems  Constitutional:  Negative for chills, fatigue and fever.  HENT:  Positive for congestion, rhinorrhea (2 days ago), sinus pressure, sinus pain, sneezing and sore throat.   Eyes:  Negative for photophobia, redness and visual disturbance.  Respiratory:  Negative for cough, chest tightness and shortness of breath.   Cardiovascular:  Negative for chest pain and palpitations.  Gastrointestinal:  Positive for abdominal pain. Negative for diarrhea and vomiting.  Endocrine: Negative for polydipsia, polyphagia and polyuria.  Genitourinary:  Positive for vaginal discharge. Negative for frequency, hematuria and urgency.  Musculoskeletal:  Negative for myalgias and neck pain.  Skin:  Negative for rash and wound.  Neurological:  Negative for tremors and weakness.  Psychiatric/Behavioral:  Negative for self-injury  and suicidal ideas.     Objective:   Today's Vitals: BP (!) 142/82 (BP Location: Left Arm)   Pulse (!) 101   Ht $R'5\' 10"'MM$  (1.778 m)   Wt (!) 387 lb 1.3 oz (175.6 kg)   LMP 12/11/2021   SpO2 98%   BMI 55.54 kg/m   Physical Exam Constitutional:      Appearance: She is obese.  HENT:     Head: Normocephalic.     Right Ear: External ear normal.     Left Ear: External ear normal.     Nose: Rhinorrhea present.     Mouth/Throat:     Mouth: Mucous membranes are moist.  Eyes:     Extraocular  Movements: Extraocular movements intact.     Pupils: Pupils are equal, round, and reactive to light.  Cardiovascular:     Rate and Rhythm: Normal rate and regular rhythm.     Pulses: Normal pulses.     Heart sounds: Normal heart sounds.  Pulmonary:     Effort: Pulmonary effort is normal.     Breath sounds: Normal breath sounds.  Abdominal:     Palpations: Abdomen is soft.     Tenderness: There is no right CVA tenderness, left CVA tenderness, guarding or rebound.  Genitourinary:    Vagina: Vaginal discharge present.  Musculoskeletal:     Cervical back: No rigidity.     Right lower leg: No edema.     Left lower leg: No edema.  Skin:    Findings: Erythema (umbilicus) present.  Neurological:     Mental Status: She is alert and oriented to person, place, and time.  Psychiatric:     Comments: Normal affect     Assessment & Plan:   Problem List Items Addressed This Visit       Cardiovascular and Mediastinum   Essential hypertension - Primary    BP Readings from Last 3 Encounters:  12/23/21 (!) 142/82  08/01/21 (!) 137/96  06/07/21 (!) 149/96  Noted being out of her BP medication Dash diet reviewed  Advised to limit daily salt intake to less than 1500 mg Refilled Zestoretic 20-25 mg daily F/u BP in 1 month      Relevant Medications   lisinopril-hydrochlorothiazide (ZESTORETIC) 20-25 MG tablet     Respiratory   Allergic rhinitis    She c/o nasal congestion, rhinorrhea, sinus pain and pressure, Sneezing, and sore throat for a week She denies SOB, cough, and systemic symptoms Will be treated with Xyzal and Flonase Recommended warm salt gaggles for sore throat      Relevant Medications   levocetirizine (XYZAL) 5 MG tablet   fluticasone (FLONASE) 50 MCG/ACT nasal spray     Musculoskeletal and Integument   Candidal intertrigo    she c/o a white discharge at the umbilicus, noting that the site is tender to palpation and erythematous Onset of symptoms for 1  week Topical antifungal cream ordered       Relevant Medications   clotrimazole (ANTIFUNGAL CLOTRIMAZOLE) 1 % cream     Other   Abdominal pain    C/o abdominal pain that worsens with oral intake of foods Pain is sharp and constant and rated 9/10 She reports an increased intake of fatty, spicy, and acidic foods Symptoms likely of GERD Will start a trial of Protonix   F/u in 1 month       Vaginal discharge    she c/o abnormal white malodorous vaginal that started two weeks ago She denies pruritus,  vaginal soreness, dyspareunia, external vulvar burning, and external dysuria She denies a fishy odor to the discharge Differential diagnoses are BV and Vulvovaginal candidiasis Pending Nuswab      Other Visit Diagnoses     Colon cancer screening       Relevant Orders   Ambulatory referral to Gastroenterology   Influenza vaccine refused       IFG (impaired fasting glucose)       Relevant Orders   CBC with Differential/Platelet   CMP14+EGFR   TSH + free T4   Hemoglobin A1C   Lipid panel   Vitamin D deficiency       Relevant Orders   Vitamin D (25 hydroxy)   Gastroesophageal reflux disease without esophagitis       Relevant Medications   pantoprazole (PROTONIX) 20 MG tablet   Encounter for screening for HIV       Relevant Orders   HIV antibody (with reflex)   Screening examination for STD (sexually transmitted disease)       Relevant Orders   NuSwab Vaginitis Plus (VG+)       Outpatient Encounter Medications as of 12/23/2021  Medication Sig   clotrimazole (ANTIFUNGAL CLOTRIMAZOLE) 1 % cream Apply 1 Application topically 2 (two) times daily.   levocetirizine (XYZAL) 5 MG tablet Take 1 tablet (5 mg total) by mouth every evening.   pantoprazole (PROTONIX) 20 MG tablet Take 1 tablet (20 mg total) by mouth daily.   fluticasone (FLONASE) 50 MCG/ACT nasal spray Place 2 sprays into both nostrils daily.   gabapentin (NEURONTIN) 100 MG capsule Take 100 mg by mouth 3 (three)  times daily. (Patient not taking: Reported on 12/23/2021)   hydrOXYzine (ATARAX/VISTARIL) 25 MG tablet Take 25 mg by mouth 3 (three) times daily as needed for anxiety. (Patient not taking: Reported on 12/23/2021)   levofloxacin (LEVAQUIN) 500 MG tablet Take 1 tablet (500 mg total) by mouth daily. (Patient not taking: Reported on 12/23/2021)   lisinopril-hydrochlorothiazide (ZESTORETIC) 20-25 MG tablet Take 1 tablet by mouth daily.   methocarbamol (ROBAXIN) 750 MG tablet Take 1 tablet (750 mg total) by mouth 3 (three) times daily. (Patient not taking: Reported on 12/23/2021)   naproxen (NAPROSYN) 500 MG tablet Take 1 tablet (500 mg total) by mouth 2 (two) times daily with a meal. (Patient not taking: Reported on 04/16/2014)   [DISCONTINUED] amLODipine (NORVASC) 5 MG tablet Take 5 mg by mouth daily. (Patient not taking: Reported on 12/23/2021)   [DISCONTINUED] benzonatate (TESSALON) 100 MG capsule Take 2 capsules (200 mg total) by mouth 3 (three) times daily as needed.   [DISCONTINUED] fluticasone (FLONASE) 50 MCG/ACT nasal spray Place 2 sprays into both nostrils daily. (Patient not taking: Reported on 12/23/2021)   [DISCONTINUED] HYDROcodone-acetaminophen (NORCO) 7.5-325 MG per tablet Take 1 tablet by mouth 3 (three) times daily. (Patient not taking: Reported on 12/23/2021)   [DISCONTINUED] lisinopril-hydrochlorothiazide (PRINZIDE,ZESTORETIC) 20-25 MG per tablet Take 1 tablet by mouth daily. (Patient not taking: Reported on 12/23/2021)   [DISCONTINUED] omeprazole (PRILOSEC) 40 MG capsule Take 40 mg by mouth daily. (Patient not taking: Reported on 12/23/2021)   [DISCONTINUED] oxyCODONE-acetaminophen (PERCOCET/ROXICET) 5-325 MG tablet Take 1 tablet by mouth every 6 (six) hours as needed. (Patient not taking: Reported on 12/23/2021)   [DISCONTINUED] predniSONE (DELTASONE) 10 MG tablet Take 6 tablets day one, 5 tablets day two, 4 tablets day three, 3 tablets day four, 2 tablets day five, then 1 tablet day six (Patient  not taking: Reported on 12/23/2021)   No facility-administered  encounter medications on file as of 12/23/2021.    Follow-up: Return in about 1 month (around 01/22/2022) for BP.   Alvira Monday, FNP

## 2021-12-25 DIAGNOSIS — B372 Candidiasis of skin and nail: Secondary | ICD-10-CM | POA: Insufficient documentation

## 2021-12-25 DIAGNOSIS — J309 Allergic rhinitis, unspecified: Secondary | ICD-10-CM | POA: Insufficient documentation

## 2021-12-25 DIAGNOSIS — N898 Other specified noninflammatory disorders of vagina: Secondary | ICD-10-CM | POA: Insufficient documentation

## 2021-12-25 DIAGNOSIS — R109 Unspecified abdominal pain: Secondary | ICD-10-CM | POA: Insufficient documentation

## 2021-12-25 NOTE — Assessment & Plan Note (Signed)
BP Readings from Last 3 Encounters:  12/23/21 (!) 142/82  08/01/21 (!) 137/96  06/07/21 (!) 149/96   Noted being out of her BP medication Dash diet reviewed  Advised to limit daily salt intake to less than 1500 mg Refilled Zestoretic 20-25 mg daily F/u BP in 1 month

## 2021-12-25 NOTE — Assessment & Plan Note (Signed)
she c/o a white discharge at the umbilicus, noting that the site is tender to palpation and erythematous Onset of symptoms for 1 week Topical antifungal cream ordered

## 2021-12-25 NOTE — Assessment & Plan Note (Signed)
she c/o abnormal white malodorous vaginal that started two weeks ago She denies pruritus, vaginal soreness, dyspareunia, external vulvar burning, and external dysuria She denies a fishy odor to the discharge Differential diagnoses are BV and Vulvovaginal candidiasis Pending Nuswab

## 2021-12-25 NOTE — Assessment & Plan Note (Signed)
She c/o nasal congestion, rhinorrhea, sinus pain and pressure, Sneezing, and sore throat for a week She denies SOB, cough, and systemic symptoms Will be treated with Xyzal and Flonase Recommended warm salt gaggles for sore throat

## 2021-12-25 NOTE — Assessment & Plan Note (Signed)
C/o abdominal pain that worsens with oral intake of foods Pain is sharp and constant and rated 9/10 She reports an increased intake of fatty, spicy, and acidic foods Symptoms likely of GERD Will start a trial of Protonix   F/u in 1 month

## 2021-12-26 DIAGNOSIS — R69 Illness, unspecified: Secondary | ICD-10-CM | POA: Diagnosis not present

## 2021-12-26 DIAGNOSIS — E559 Vitamin D deficiency, unspecified: Secondary | ICD-10-CM | POA: Diagnosis not present

## 2021-12-26 DIAGNOSIS — Z114 Encounter for screening for human immunodeficiency virus [HIV]: Secondary | ICD-10-CM | POA: Diagnosis not present

## 2021-12-26 DIAGNOSIS — R7301 Impaired fasting glucose: Secondary | ICD-10-CM | POA: Diagnosis not present

## 2021-12-27 ENCOUNTER — Encounter: Payer: Self-pay | Admitting: *Deleted

## 2021-12-27 LAB — LIPID PANEL
Chol/HDL Ratio: 2.4 ratio (ref 0.0–4.4)
Cholesterol, Total: 151 mg/dL (ref 100–199)
HDL: 63 mg/dL (ref >39)
LDL Chol Calc (NIH): 73 mg/dL (ref 0–99)
Triglycerides: 79 mg/dL (ref 0–149)
VLDL Cholesterol Cal: 15 mg/dL (ref 5–40)

## 2021-12-27 LAB — CMP14+EGFR
ALT: 10 IU/L (ref 0–32)
AST: 15 IU/L (ref 0–40)
Albumin/Globulin Ratio: 1.4 (ref 1.2–2.2)
Albumin: 4 g/dL (ref 3.9–4.9)
Alkaline Phosphatase: 121 IU/L (ref 44–121)
BUN/Creatinine Ratio: 10 (ref 9–23)
BUN: 11 mg/dL (ref 6–24)
Bilirubin Total: 0.5 mg/dL (ref 0.0–1.2)
CO2: 24 mmol/L (ref 20–29)
Calcium: 9.1 mg/dL (ref 8.7–10.2)
Chloride: 100 mmol/L (ref 96–106)
Creatinine, Ser: 1.06 mg/dL — ABNORMAL HIGH (ref 0.57–1.00)
Globulin, Total: 2.9 g/dL (ref 1.5–4.5)
Glucose: 83 mg/dL (ref 70–99)
Potassium: 4.5 mmol/L (ref 3.5–5.2)
Sodium: 138 mmol/L (ref 134–144)
Total Protein: 6.9 g/dL (ref 6.0–8.5)
eGFR: 66 mL/min/{1.73_m2} (ref >59)

## 2021-12-27 LAB — CBC WITH DIFFERENTIAL/PLATELET
Basophils Absolute: 0.1 10*3/uL (ref 0.0–0.2)
Basos: 1 %
EOS (ABSOLUTE): 0.4 10*3/uL (ref 0.0–0.4)
Eos: 6 %
Hematocrit: 39.5 % (ref 34.0–46.6)
Hemoglobin: 12.9 g/dL (ref 11.1–15.9)
Immature Grans (Abs): 0 10*3/uL (ref 0.0–0.1)
Immature Granulocytes: 0 %
Lymphocytes Absolute: 2.1 10*3/uL (ref 0.7–3.1)
Lymphs: 30 %
MCH: 27.3 pg (ref 26.6–33.0)
MCHC: 32.7 g/dL (ref 31.5–35.7)
MCV: 84 fL (ref 79–97)
Monocytes Absolute: 0.5 10*3/uL (ref 0.1–0.9)
Monocytes: 7 %
Neutrophils Absolute: 4 10*3/uL (ref 1.4–7.0)
Neutrophils: 56 %
Platelets: 338 10*3/uL (ref 150–450)
RBC: 4.73 x10E6/uL (ref 3.77–5.28)
RDW: 13.6 % (ref 11.7–15.4)
WBC: 7.1 10*3/uL (ref 3.4–10.8)

## 2021-12-27 LAB — NUSWAB VAGINITIS PLUS (VG+)
Candida albicans, NAA: NEGATIVE
Candida glabrata, NAA: NEGATIVE
Chlamydia trachomatis, NAA: NEGATIVE
Neisseria gonorrhoeae, NAA: NEGATIVE
Trich vag by NAA: NEGATIVE

## 2021-12-27 LAB — VITAMIN D 25 HYDROXY (VIT D DEFICIENCY, FRACTURES): Vit D, 25-Hydroxy: 30.9 ng/mL (ref 30.0–100.0)

## 2021-12-27 LAB — HIV ANTIBODY (ROUTINE TESTING W REFLEX): HIV Screen 4th Generation wRfx: NONREACTIVE

## 2021-12-27 LAB — TSH+FREE T4
Free T4: 1.32 ng/dL (ref 0.82–1.77)
TSH: 0.879 u[IU]/mL (ref 0.450–4.500)

## 2021-12-27 LAB — HEMOGLOBIN A1C
Est. average glucose Bld gHb Est-mCnc: 100 mg/dL
Hgb A1c MFr Bld: 5.1 % (ref 4.8–5.6)

## 2021-12-28 NOTE — Progress Notes (Signed)
Please inform the patient to increase her fluids intake. Her labs were stable with no evidence of BV, yeast of gonorrhea chlamydia and trichomoniasis noted on her std testing.

## 2022-01-18 ENCOUNTER — Emergency Department (HOSPITAL_COMMUNITY)
Admission: EM | Admit: 2022-01-18 | Discharge: 2022-01-19 | Disposition: A | Discharge status: Home / Self Care | Payer: BLUE CROSS/BLUE SHIELD | Attending: Emergency Medicine | Admitting: Emergency Medicine

## 2022-01-18 ENCOUNTER — Encounter (HOSPITAL_COMMUNITY): Payer: Self-pay

## 2022-01-18 ENCOUNTER — Emergency Department (HOSPITAL_COMMUNITY): Payer: BLUE CROSS/BLUE SHIELD

## 2022-01-18 ENCOUNTER — Other Ambulatory Visit: Payer: Self-pay

## 2022-01-18 DIAGNOSIS — N3000 Acute cystitis without hematuria: Secondary | ICD-10-CM

## 2022-01-18 DIAGNOSIS — B349 Viral infection, unspecified: Secondary | ICD-10-CM | POA: Diagnosis not present

## 2022-01-18 DIAGNOSIS — R197 Diarrhea, unspecified: Secondary | ICD-10-CM | POA: Diagnosis present

## 2022-01-18 DIAGNOSIS — R112 Nausea with vomiting, unspecified: Secondary | ICD-10-CM

## 2022-01-18 DIAGNOSIS — E860 Dehydration: Secondary | ICD-10-CM | POA: Diagnosis not present

## 2022-01-18 DIAGNOSIS — Z20822 Contact with and (suspected) exposure to covid-19: Secondary | ICD-10-CM | POA: Diagnosis not present

## 2022-01-18 LAB — URINALYSIS, ROUTINE W REFLEX MICROSCOPIC
Bilirubin Urine: NEGATIVE
Glucose, UA: NEGATIVE mg/dL
Hgb urine dipstick: NEGATIVE
Ketones, ur: NEGATIVE mg/dL
Nitrite: POSITIVE — AB
Protein, ur: NEGATIVE mg/dL
Specific Gravity, Urine: >1.046 — ABNORMAL HIGH (ref 1.005–1.030)
pH: 5 (ref 5.0–8.0)

## 2022-01-18 LAB — LIPASE, BLOOD: Lipase: 37 U/L (ref 11–51)

## 2022-01-18 LAB — CBC WITH DIFFERENTIAL/PLATELET
Abs Immature Granulocytes: 0.03 10*3/uL (ref 0.00–0.07)
Basophils Absolute: 0 10*3/uL (ref 0.0–0.1)
Basophils Relative: 0 %
Eosinophils Absolute: 0.5 10*3/uL (ref 0.0–0.5)
Eosinophils Relative: 6 %
HCT: 39.9 % (ref 36.0–46.0)
Hemoglobin: 12.9 g/dL (ref 12.0–15.0)
Immature Granulocytes: 0 %
Lymphocytes Relative: 23 %
Lymphs Abs: 1.9 10*3/uL (ref 0.7–4.0)
MCH: 27.6 pg (ref 26.0–34.0)
MCHC: 32.3 g/dL (ref 30.0–36.0)
MCV: 85.3 fL (ref 80.0–100.0)
Monocytes Absolute: 0.6 10*3/uL (ref 0.1–1.0)
Monocytes Relative: 7 %
Neutro Abs: 5.2 10*3/uL (ref 1.7–7.7)
Neutrophils Relative %: 64 %
Platelets: 310 10*3/uL (ref 150–400)
RBC: 4.68 MIL/uL (ref 3.87–5.11)
RDW: 13.5 % (ref 11.5–15.5)
WBC: 8.2 10*3/uL (ref 4.0–10.5)
nRBC: 0 % (ref 0.0–0.2)

## 2022-01-18 LAB — COMPREHENSIVE METABOLIC PANEL
ALT: 21 U/L (ref 0–44)
AST: 21 U/L (ref 15–41)
Albumin: 3.6 g/dL (ref 3.5–5.0)
Alkaline Phosphatase: 99 U/L (ref 38–126)
Anion gap: 7 (ref 5–15)
BUN: 13 mg/dL (ref 6–20)
CO2: 27 mmol/L (ref 22–32)
Calcium: 8.8 mg/dL — ABNORMAL LOW (ref 8.9–10.3)
Chloride: 103 mmol/L (ref 98–111)
Creatinine, Ser: 1.19 mg/dL — ABNORMAL HIGH (ref 0.44–1.00)
GFR, Estimated: 57 mL/min — ABNORMAL LOW (ref >60)
Glucose, Bld: 98 mg/dL (ref 70–99)
Potassium: 3.9 mmol/L (ref 3.5–5.1)
Sodium: 137 mmol/L (ref 135–145)
Total Bilirubin: 0.6 mg/dL (ref 0.3–1.2)
Total Protein: 7.5 g/dL (ref 6.5–8.1)

## 2022-01-18 LAB — CBG MONITORING, ED: Glucose-Capillary: 56 mg/dL — ABNORMAL LOW (ref 70–99)

## 2022-01-18 LAB — SARS CORONAVIRUS 2 BY RT PCR: SARS Coronavirus 2 by RT PCR: NEGATIVE

## 2022-01-18 LAB — I-STAT BETA HCG BLOOD, ED (MC, WL, AP ONLY): I-stat hCG, quantitative: <5 m[IU]/mL (ref <5)

## 2022-01-18 MED ORDER — DICYCLOMINE HCL 10 MG PO CAPS
20.0000 mg | ORAL_CAPSULE | Freq: Once | ORAL | Status: AC
  Administered 2022-01-18: 20 mg via ORAL
  Filled 2022-01-18: qty 2

## 2022-01-18 MED ORDER — SULFAMETHOXAZOLE-TRIMETHOPRIM 800-160 MG PO TABS
1.0000 | ORAL_TABLET | Freq: Once | ORAL | Status: AC
  Administered 2022-01-18: 1 via ORAL
  Filled 2022-01-18: qty 1

## 2022-01-18 MED ORDER — ONDANSETRON HCL 4 MG/2ML IJ SOLN
4.0000 mg | Freq: Once | INTRAMUSCULAR | Status: AC
  Administered 2022-01-18: 4 mg via INTRAVENOUS
  Filled 2022-01-18: qty 2

## 2022-01-18 MED ORDER — FENTANYL CITRATE PF 50 MCG/ML IJ SOSY
50.0000 ug | PREFILLED_SYRINGE | Freq: Once | INTRAMUSCULAR | Status: AC
  Administered 2022-01-18: 50 ug via INTRAVENOUS
  Filled 2022-01-18: qty 1

## 2022-01-18 MED ORDER — ONDANSETRON 4 MG PO TBDP: 4.0000 mg | ORAL_TABLET | Freq: Three times a day (TID) | ORAL | 0 refills | Status: DC | PRN

## 2022-01-18 MED ORDER — DICYCLOMINE HCL 20 MG PO TABS: 20.0000 mg | ORAL_TABLET | Freq: Three times a day (TID) | ORAL | 0 refills | Status: DC | PRN

## 2022-01-18 MED ORDER — IOHEXOL 300 MG/ML  SOLN
100.0000 mL | Freq: Once | INTRAMUSCULAR | Status: AC | PRN
  Administered 2022-01-18: 100 mL via INTRAVENOUS

## 2022-01-18 MED ORDER — SULFAMETHOXAZOLE-TRIMETHOPRIM 800-160 MG PO TABS: 1.0000 | ORAL_TABLET | Freq: Two times a day (BID) | ORAL | 0 refills | Status: DC

## 2022-01-18 MED ORDER — FAMOTIDINE 20 MG PO TABS: 20.0000 mg | ORAL_TABLET | Freq: Every day | ORAL | 0 refills | Status: DC

## 2022-01-18 MED ORDER — SODIUM CHLORIDE 0.9 % IV BOLUS
1000.0000 mL | Freq: Once | INTRAVENOUS | Status: AC
  Administered 2022-01-18: 1000 mL via INTRAVENOUS

## 2022-01-18 NOTE — ED Provider Notes (Signed)
[facility]-EMERGENCY DEPT Provider Note   CSN: [id number] Arrival date & time: 01/18/22  1604     History  Chief Complaint  Patient presents with   Diarrhea    Tracie Green is a 46 y.o. female presented emergency department with complaint of diarrhea, nausea.  Patient also ports headache, fevers, feeling unwell for about 3 days.  Reports a history of cholecystectomy.  HPI     Home Medications Prior to Admission medications   Medication Sig Start Date End Date Taking? Authorizing Provider  dicyclomine (BENTYL) 20 MG tablet Take 1 tablet (20 mg total) by mouth 3 (three) times daily as needed for up to 21 doses for spasms. 01/18/22  Yes Terald Sleeper, MD  famotidine (PEPCID) 20 MG tablet Take 1 tablet (20 mg total) by mouth daily for 30 doses. 01/18/22 02/17/22 Yes Anneka Studer, Kermit Balo, MD  fluticasone (FLONASE) 50 MCG/ACT nasal spray Place 2 sprays into both nostrils daily. 12/23/21  Yes Gilmore Laroche, FNP  levocetirizine (XYZAL) 5 MG tablet Take 1 tablet (5 mg total) by mouth every evening. 12/23/21  Yes Gilmore Laroche, FNP  lisinopril-hydrochlorothiazide (ZESTORETIC) 20-25 MG tablet Take 1 tablet by mouth daily. 12/23/21  Yes Gilmore Laroche, FNP  ondansetron (ZOFRAN-ODT) 4 MG disintegrating tablet Take 1 tablet (4 mg total) by mouth every 8 (eight) hours as needed for up to 15 doses for nausea or vomiting. 01/18/22  Yes Terald Sleeper, MD  pantoprazole (PROTONIX) 20 MG tablet Take 1 tablet (20 mg total) by mouth daily. 12/23/21  Yes Gilmore Laroche, FNP  sulfamethoxazole-trimethoprim (BACTRIM DS) 800-160 MG tablet Take 1 tablet by mouth 2 (two) times daily for 5 days. 01/18/22 01/23/22 Yes Mahalia Dykes, Kermit Balo, MD  clotrimazole (ANTIFUNGAL CLOTRIMAZOLE) 1 % cream Apply 1 Application topically 2 (two) times daily. Patient not taking: Reported on 01/18/2022 12/23/21   Gilmore Laroche, FNP  levofloxacin (LEVAQUIN) 500 MG tablet Take 1 tablet (500 mg total) by mouth  daily. Patient not taking: Reported on 12/23/2021 08/01/21   Burgess Amor, PA-C  methocarbamol (ROBAXIN) 750 MG tablet Take 1 tablet (750 mg total) by mouth 3 (three) times daily. Patient not taking: Reported on 12/23/2021 06/07/21   Burgess Amor, PA-C  naproxen (NAPROSYN) 500 MG tablet Take 1 tablet (500 mg total) by mouth 2 (two) times daily with a meal. Patient not taking: Reported on 04/16/2014 01/22/14   Pauline Aus, PA-C      Allergies    Penicillins, Morphine, and Morphine and related    Review of Systems   Review of Systems  Physical Exam Updated Vital Signs BP (!) 145/88   Pulse 93   Temp 98.4 F (36.9 C) (Oral)   Resp (!) 24   LMP 12/11/2021   SpO2 100%  Physical Exam Constitutional:      General: She is not in acute distress.    Appearance: She is obese.  HENT:     Head: Normocephalic and atraumatic.  Eyes:     Conjunctiva/sclera: Conjunctivae normal.     Pupils: Pupils are equal, round, and reactive to light.  Cardiovascular:     Rate and Rhythm: Normal rate and regular rhythm.  Pulmonary:     Effort: Pulmonary effort is normal. No respiratory distress.  Abdominal:     General: There is no distension.     Tenderness: There is no abdominal tenderness.  Skin:    General: Skin is warm and dry.  Neurological:     General: No focal deficit present.  Mental Status: She is alert. Mental status is at baseline.  Psychiatric:        Mood and Affect: Mood normal.        Behavior: Behavior normal.     ED Results / Procedures / Treatments   Labs (all labs ordered are listed, but only abnormal results are displayed) Labs Reviewed  COMPREHENSIVE METABOLIC PANEL - Abnormal; Notable for the following components:      Result Value   Creatinine, Ser 1.19 (*)    Calcium 8.8 (*)    GFR, Estimated 57 (*)    All other components within normal limits  URINALYSIS, ROUTINE W REFLEX MICROSCOPIC - Abnormal; Notable for the following components:   APPearance CLOUDY (*)     Specific Gravity, Urine >1.046 (*)    Nitrite POSITIVE (*)    Leukocytes,Ua SMALL (*)    Bacteria, UA FEW (*)    All other components within normal limits  CBG MONITORING, ED - Abnormal; Notable for the following components:   Glucose-Capillary 56 (*)    All other components within normal limits  SARS CORONAVIRUS 2 BY RT PCR  URINE CULTURE  CBC WITH DIFFERENTIAL/PLATELET  LIPASE, BLOOD  I-STAT BETA HCG BLOOD, ED ([facility], [facility], [facility] ONLY)    EKG None  Radiology CT Abdomen Pelvis W Contrast  Result Date: 01/18/2022 CLINICAL DATA:  Abdominal pain, acute, nonlocalized EXAM: CT ABDOMEN AND PELVIS WITH CONTRAST TECHNIQUE: Multidetector CT imaging of the abdomen and pelvis was performed using the standard protocol following bolus administration of intravenous contrast. RADIATION DOSE REDUCTION: This exam was performed according to the departmental dose-optimization program which includes automated exposure control, adjustment of the mA and/or kV according to patient size and/or use of iterative reconstruction technique. CONTRAST:  120mL OMNIPAQUE IOHEXOL 300 MG/ML  SOLN COMPARISON:  CT 06/07/2021 FINDINGS: Lower chest: Small peripheral opacity in the right lower lobe series 4, image 50. No pleural effusion. Hepatobiliary: The liver is enlarged spanning 20.4 cm cranial caudal. Mild subjective steatosis. No focal liver lesion. Clips in the gallbladder fossa postcholecystectomy. No biliary dilatation. Pancreas: No ductal dilatation or inflammation. Spleen: Upper normal in size measuring 13.3 x 6.3 x 10.2 cm (volume = 450 cm^3). No focal abnormality. Adrenals/Urinary Tract: Normal adrenal glands. Punctate nonobstructing stones in the upper and lower pole of the left kidney. No hydronephrosis. No perinephric edema. No ureteral stone. Small subcentimeter hypodensity in the lateral left kidney is too small to characterize, likely small cyst. No further imaging follow-up is recommended. Unremarkable urinary bladder.  Stomach/Bowel: Tiny hiatal hernia. Unremarkable stomach. No bowel obstruction or inflammatory change. The appendix is normal. Minimal sigmoid colonic diverticulosis. No diverticulitis or acute colonic inflammation. Vascular/Lymphatic: Normal caliber abdominal aorta. Patent portal vein. No enlarged lymph nodes in the abdomen or pelvis. Reproductive: Uterus and bilateral adnexa are unremarkable. Other: No ascites. No abdominopelvic collection. Tiny fat containing umbilical hernia. Musculoskeletal: L2-L3 degenerative disc disease. There are no acute or suspicious osseous abnormalities. IMPRESSION: 1. No acute abnormality in the abdomen/pelvis. 2. Punctate nonobstructing left nephrolithiasis. 3. Hepatomegaly and mild hepatic steatosis. Borderline splenomegaly. 4. Small peripheral opacity in the right lower lobe may represent pneumonia or atelectasis. Electronically Signed   By: Keith Rake M.D.   On: 01/18/2022 19:05    Procedures Procedures    Medications Ordered in ED Medications  iohexol (OMNIPAQUE) 300 MG/ML solution 100 mL (100 mLs Intravenous Contrast Given 01/18/22 1834)  sodium chloride 0.9 % bolus 1,000 mL (1,000 mLs Intravenous New Bag/Given 01/18/22 2230)  fentaNYL (  SUBLIMAZE) injection 50 mcg (50 mcg Intravenous Given 01/18/22 2230)  dicyclomine (BENTYL) capsule 20 mg (20 mg Oral Given 01/18/22 2242)  ondansetron (ZOFRAN) injection 4 mg (4 mg Intravenous Given 01/18/22 2230)  sulfamethoxazole-trimethoprim (BACTRIM DS) 800-160 MG per tablet 1 tablet (1 tablet Oral Given 01/18/22 2231)    ED Course/ Medical Decision Making/ A&P                           Medical Decision Making Amount and/or Complexity of Data Reviewed Labs: ordered.  Risk Prescription drug management.   This patient presents to the ED with concern for headache, nausea, fever, diarrhea. This involves an extensive number of treatment options, and is a complaint that carries with it a high risk of complications and  morbidity.  The differential diagnosis includes colitis versus viral illness versus other  I ordered and personally interpreted labs.  The pertinent results include: No leukocytosis.  CMP within normal limits.  Lipase within normal limits.  Glucose did drop a bit in the ED, but has been stable otherwise.  UA is consistent with infection with positive nitrites and leukocytes.  Bactrim ordered.  I ordered imaging studies including CT abdomen pelvis I independently visualized and interpreted imaging which showed hepatic steatosis, question opacity in the right lower lobe, which I strongly suspect is atelectasis, she has no respiratory symptoms or complaints to suggest pneumonia I agree with the radiologist interpretation  Her headache appears tension type, nonlocalized.  I have a low suspicion for [facility], [facility], or meningitis, and I do not feel that lumbar puncture is indicated at this time.  I ordered medication including for abdominal pain and cramping, nausea, and UTI  I have reviewed the patients home medicines and have made adjustments as needed  After the interventions noted above, I reevaluated the patient and found that they have: improved   Dispostion:  After consideration of the diagnostic results and the patients response to treatment, I feel that the patent would benefit from to be follow-up.  I explained if he continues having symptoms of persistent diarrhea, her doctor could order stool studies, but this time it seems reasonable to treat for potential viral infection.  The UTI may be an incidental finding, but also reasonable to treat at this time..         Final Clinical Impression(s) / ED Diagnoses Final diagnoses:  Dehydration  Nausea and vomiting, unspecified vomiting type  Viral illness  Acute cystitis without hematuria    Rx / DC Orders ED Discharge Orders          Ordered    ondansetron (ZOFRAN-ODT) 4 MG disintegrating tablet  Every 8 hours PRN        01/18/22  2214    famotidine (PEPCID) 20 MG tablet  Daily        01/18/22 2214    dicyclomine (BENTYL) 20 MG tablet  3 times daily PRN        01/18/22 2214    sulfamethoxazole-trimethoprim (BACTRIM DS) 800-160 MG tablet  2 times daily        01/18/22 2214              Terald Sleeper, MD 01/18/22 2258

## 2022-01-18 NOTE — ED Provider Triage Note (Signed)
Emergency Medicine Provider Triage Evaluation Note  Tracie Green , a 46 y.o. female  was evaluated in triage.  Pt complains of left side abdominal pain, vomiting on Monday at onset of symptoms, resolved, now with non bloody diarrhea, also urinary frequency without dysuria or hematuria. Chills last night, did not check temp. No hx diabetes.   Review of Systems  Positive: As above Negative: As above  Physical Exam  BP (!) 127/116 (BP Location: Right Arm)   Pulse (!) 107   Temp 98.3 F (36.8 C) (Oral)   Resp 15   LMP 12/11/2021   SpO2 98%  Gen:   Awake, no distress   Resp:  Normal effort  MSK:   Moves extremities without difficulty  Other:    Medical Decision Making  Medically screening exam initiated at 4:17 PM.  Appropriate orders placed.  Tracie Green was informed that the remainder of the evaluation will be completed by another provider, this initial triage assessment does not replace that evaluation, and the importance of remaining in the ED until their evaluation is complete.     Tracie Learn, PA-C 01/18/22 [phone]

## 2022-01-18 NOTE — ED Triage Notes (Signed)
Patient began vomiting Monday and has been nauseous since. Diarrhea started yesterday. Feeling weak with no appetite. Left lower sided abdominal pain that moves to her left back. Said her urine is dark yellow.

## 2022-01-18 NOTE — Discharge Instructions (Addendum)
Your urine showed signs of urinary tract infection, and you were started on Bactrim antibiotic.   I also prescribed you Zofran for nausea and Bentyl for cramping abdominal pain.  You may be experiencing a virus causing all of the symptoms.  Please follow-up with your COVID test tomorrow morning.  If you are positive you should quarantine for 10 days from the start of your symptoms.  If your COVID test negative, he was to have another virus, and should still quarantine through the end of the week.  Continue drinking plenty of fluids and take Tylenol as needed for fever and chills at home.  Please follow-up with your primary care provider in the office.

## 2022-01-19 ENCOUNTER — Telehealth: Payer: Self-pay | Admitting: Family Medicine

## 2022-01-19 NOTE — Telephone Encounter (Signed)
Pt called stating she was seen on 01/18/22 and states er recommended she been seen within a couple days. Can you do TOC call?    [phone] or [phone]

## 2022-01-21 LAB — URINE CULTURE: Culture: >=100000 — AB

## 2022-01-22 ENCOUNTER — Encounter (HOSPITAL_BASED_OUTPATIENT_CLINIC_OR_DEPARTMENT_OTHER): Payer: Self-pay | Admitting: Emergency Medicine

## 2022-01-22 ENCOUNTER — Emergency Department (HOSPITAL_BASED_OUTPATIENT_CLINIC_OR_DEPARTMENT_OTHER): Payer: BLUE CROSS/BLUE SHIELD

## 2022-01-22 ENCOUNTER — Inpatient Hospital Stay (HOSPITAL_BASED_OUTPATIENT_CLINIC_OR_DEPARTMENT_OTHER)
Admission: EM | Admit: 2022-01-22 | Discharge: 2022-01-26 | DRG: 092 | Disposition: A | Discharge status: Home / Self Care | Payer: BLUE CROSS/BLUE SHIELD | Attending: Neurology | Admitting: Neurology

## 2022-01-22 ENCOUNTER — Telehealth (HOSPITAL_BASED_OUTPATIENT_CLINIC_OR_DEPARTMENT_OTHER): Payer: Self-pay | Admitting: *Deleted

## 2022-01-22 ENCOUNTER — Other Ambulatory Visit: Payer: Self-pay

## 2022-01-22 DIAGNOSIS — I639 Cerebral infarction, unspecified: Secondary | ICD-10-CM | POA: Diagnosis present

## 2022-01-22 DIAGNOSIS — Z885 Allergy status to narcotic agent status: Secondary | ICD-10-CM

## 2022-01-22 DIAGNOSIS — Z6841 Body Mass Index (BMI) 40.0 and over, adult: Secondary | ICD-10-CM | POA: Diagnosis not present

## 2022-01-22 DIAGNOSIS — R29818 Other symptoms and signs involving the nervous system: Principal | ICD-10-CM | POA: Diagnosis present

## 2022-01-22 DIAGNOSIS — M545 Low back pain, unspecified: Secondary | ICD-10-CM | POA: Diagnosis present

## 2022-01-22 DIAGNOSIS — R531 Weakness: Secondary | ICD-10-CM | POA: Diagnosis present

## 2022-01-22 DIAGNOSIS — M549 Dorsalgia, unspecified: Secondary | ICD-10-CM

## 2022-01-22 DIAGNOSIS — N179 Acute kidney failure, unspecified: Secondary | ICD-10-CM

## 2022-01-22 DIAGNOSIS — Z86711 Personal history of pulmonary embolism: Secondary | ICD-10-CM

## 2022-01-22 DIAGNOSIS — I6389 Other cerebral infarction: Secondary | ICD-10-CM | POA: Diagnosis not present

## 2022-01-22 DIAGNOSIS — Z79899 Other long term (current) drug therapy: Secondary | ICD-10-CM | POA: Diagnosis not present

## 2022-01-22 DIAGNOSIS — E785 Hyperlipidemia, unspecified: Secondary | ICD-10-CM | POA: Diagnosis present

## 2022-01-22 DIAGNOSIS — I10 Essential (primary) hypertension: Secondary | ICD-10-CM | POA: Diagnosis present

## 2022-01-22 DIAGNOSIS — Z880 Allergy status to penicillin: Secondary | ICD-10-CM | POA: Diagnosis not present

## 2022-01-22 DIAGNOSIS — R2990 Unspecified symptoms and signs involving the nervous system: Secondary | ICD-10-CM

## 2022-01-22 LAB — CBC
HCT: 39.2 % (ref 36.0–46.0)
Hemoglobin: 12.7 g/dL (ref 12.0–15.0)
MCH: 27.4 pg (ref 26.0–34.0)
MCHC: 32.4 g/dL (ref 30.0–36.0)
MCV: 84.7 fL (ref 80.0–100.0)
Platelets: 324 10*3/uL (ref 150–400)
RBC: 4.63 MIL/uL (ref 3.87–5.11)
RDW: 13.9 % (ref 11.5–15.5)
WBC: 12 10*3/uL — ABNORMAL HIGH (ref 4.0–10.5)
nRBC: 0 % (ref 0.0–0.2)

## 2022-01-22 LAB — COMPREHENSIVE METABOLIC PANEL
ALT: 11 U/L (ref 0–44)
AST: 14 U/L — ABNORMAL LOW (ref 15–41)
Albumin: 4 g/dL (ref 3.5–5.0)
Alkaline Phosphatase: 97 U/L (ref 38–126)
Anion gap: 10 (ref 5–15)
BUN: 19 mg/dL (ref 6–20)
CO2: 26 mmol/L (ref 22–32)
Calcium: 9.4 mg/dL (ref 8.9–10.3)
Chloride: 101 mmol/L (ref 98–111)
Creatinine, Ser: 1.74 mg/dL — ABNORMAL HIGH (ref 0.44–1.00)
GFR, Estimated: 36 mL/min — ABNORMAL LOW (ref >60)
Glucose, Bld: 82 mg/dL (ref 70–99)
Potassium: 3.4 mmol/L — ABNORMAL LOW (ref 3.5–5.1)
Sodium: 137 mmol/L (ref 135–145)
Total Bilirubin: 0.3 mg/dL (ref 0.3–1.2)
Total Protein: 7.5 g/dL (ref 6.5–8.1)

## 2022-01-22 LAB — APTT: aPTT: 30 seconds (ref 24–36)

## 2022-01-22 LAB — DIFFERENTIAL
Abs Immature Granulocytes: 0.04 10*3/uL (ref 0.00–0.07)
Basophils Absolute: 0.1 10*3/uL (ref 0.0–0.1)
Basophils Relative: 1 %
Eosinophils Absolute: 0.5 10*3/uL (ref 0.0–0.5)
Eosinophils Relative: 4 %
Immature Granulocytes: 0 %
Lymphocytes Relative: 24 %
Lymphs Abs: 2.9 10*3/uL (ref 0.7–4.0)
Monocytes Absolute: 0.9 10*3/uL (ref 0.1–1.0)
Monocytes Relative: 8 %
Neutro Abs: 7.6 10*3/uL (ref 1.7–7.7)
Neutrophils Relative %: 63 %

## 2022-01-22 LAB — CBG MONITORING, ED: Glucose-Capillary: 79 mg/dL (ref 70–99)

## 2022-01-22 LAB — PROTIME-INR
INR: 1 (ref 0.8–1.2)
Prothrombin Time: 12.9 seconds (ref 11.4–15.2)

## 2022-01-22 LAB — ETHANOL: Alcohol, Ethyl (B): <10 mg/dL (ref <10)

## 2022-01-22 LAB — HCG, SERUM, QUALITATIVE: Preg, Serum: NEGATIVE

## 2022-01-22 MED ORDER — ACETAMINOPHEN 650 MG RE SUPP
650.0000 mg | RECTAL | Status: DC | PRN
  Administered 2022-01-23: 650 mg via RECTAL
  Filled 2022-01-22: qty 1

## 2022-01-22 MED ORDER — LACTATED RINGERS IV BOLUS
1000.0000 mL | Freq: Once | INTRAVENOUS | Status: AC
  Administered 2022-01-22: 1000 mL via INTRAVENOUS

## 2022-01-22 MED ORDER — STROKE: EARLY STAGES OF RECOVERY BOOK
Freq: Once | Status: AC
  Filled 2022-01-22: qty 1

## 2022-01-22 MED ORDER — TENECTEPLASE FOR STROKE
PACK | INTRAVENOUS | Status: AC
  Filled 2022-01-22: qty 10

## 2022-01-22 MED ORDER — ACETAMINOPHEN 325 MG PO TABS
650.0000 mg | ORAL_TABLET | ORAL | Status: DC | PRN
  Administered 2022-01-23 – 2022-01-26 (×3): 650 mg via ORAL
  Filled 2022-01-22 (×3): qty 2

## 2022-01-22 MED ORDER — CHLORHEXIDINE GLUCONATE CLOTH 2 % EX PADS
6.0000 | MEDICATED_PAD | Freq: Once | CUTANEOUS | Status: AC
  Administered 2022-01-22: 6 via TOPICAL

## 2022-01-22 MED ORDER — METOCLOPRAMIDE HCL 5 MG/ML IJ SOLN
10.0000 mg | Freq: Once | INTRAMUSCULAR | Status: AC
  Administered 2022-01-22: 10 mg via INTRAVENOUS
  Filled 2022-01-22: qty 2

## 2022-01-22 MED ORDER — IOHEXOL 350 MG/ML SOLN
100.0000 mL | Freq: Once | INTRAVENOUS | Status: AC | PRN
  Administered 2022-01-22: 75 mL via INTRAVENOUS

## 2022-01-22 MED ORDER — TENECTEPLASE FOR STROKE: 25.0000 mg | PACK | Freq: Once | INTRAVENOUS | Status: DC

## 2022-01-22 MED ORDER — POTASSIUM CHLORIDE IN NACL 20-0.9 MEQ/L-% IV SOLN
INTRAVENOUS | Status: AC
  Filled 2022-01-22 (×3): qty 1000

## 2022-01-22 MED ORDER — CLEVIDIPINE BUTYRATE 0.5 MG/ML IV EMUL: 0.0000 mg/h | INTRAVENOUS | Status: DC

## 2022-01-22 MED ORDER — SENNOSIDES-DOCUSATE SODIUM 8.6-50 MG PO TABS: 1.0000 | ORAL_TABLET | Freq: Every evening | ORAL | Status: DC | PRN

## 2022-01-22 MED ORDER — PANTOPRAZOLE SODIUM 40 MG IV SOLR
40.0000 mg | Freq: Every day | INTRAVENOUS | Status: DC
  Administered 2022-01-22 – 2022-01-23 (×2): 40 mg via INTRAVENOUS
  Filled 2022-01-22 (×2): qty 10

## 2022-01-22 MED ORDER — SODIUM CHLORIDE 0.9 % IV SOLN: INTRAVENOUS | Status: DC

## 2022-01-22 MED ORDER — ACETAMINOPHEN 160 MG/5ML PO SOLN
650.0000 mg | ORAL | Status: DC | PRN
  Administered 2022-01-23: 650 mg
  Filled 2022-01-22: qty 20.3

## 2022-01-22 MED ORDER — TENECTEPLASE FOR STROKE
25.0000 mg | PACK | Freq: Once | INTRAVENOUS | Status: DC
  Administered 2022-01-22: 25 mg via INTRAVENOUS

## 2022-01-22 MED ORDER — CHLORHEXIDINE GLUCONATE CLOTH 2 % EX PADS
6.0000 | MEDICATED_PAD | Freq: Every morning | CUTANEOUS | Status: DC
  Administered 2022-01-24 – 2022-01-26 (×3): 6 via TOPICAL

## 2022-01-22 MED ORDER — SODIUM CHLORIDE 0.9% FLUSH
3.0000 mL | Freq: Once | INTRAVENOUS | Status: DC
  Filled 2022-01-22: qty 3

## 2022-01-22 NOTE — H&P (Incomplete)
Admission H&P    Chief Complaint: Acute onset of left sided weakness.   HPI: Tracie Green is an 46 y.o. female with a PMHx of HTN, PE, allergic rhinitis, Candida intertrigo and recent abdominal pain. She has been transferred to [facility] following TNK administration at the [facility] ED on Sunday afternoon for presumed stroke. Patient had presented to the ED at about 3:35 PM and on evaluation in Triage she was noted to be weak on the left. CT head was obtained which showed no acute abnormality. Code Stroke was called. On evaluation by Teleneurology, she stated that she was singing in a choir at [facility] when at 1450 when she had an episode of feeling dizzy and lightheaded and passed out in a chair. She stated she did not fall or hurt herself.  The people around the chair apparently did not call EMS immediately. She stated that she was out for 15 to 20 minutes and woke up with people pouring water on her head.  She complained of headache and left-sided weakness and numbness which has persisted. She was also complaining of pain on the left side of her body but was not able to localize it. At the time of Teleneurology evaluation at the OSH, she stated that her symptoms had not improved. She denied amu prior history of strokes, TIA, seizures, or migraine headaches. Decision was made to administer TNK and she was sent to [facility] for admission to the Neuro ICU. CTA showed no LVO.   Of note, she was seen in the ER on 01/18/2022 for abdominal pain and diarrhea.  She was treated with Bactrim and subsequently switched to [facility] for UTI.   Past Medical History:  Diagnosis Date   Hypertension    PE (pulmonary embolism)     Past Surgical History:  Procedure Laterality Date   CESAREAN SECTION     CHOLECYSTECTOMY  1998   INCISION AND DRAINAGE      History reviewed. No pertinent family history. Social History:  reports that she has never smoked. She has never used smokeless tobacco. She reports that she does not drink alcohol  and does not use drugs.  Allergies:  Allergies  Allergen Reactions   Penicillins Hives and Rash    Hives   Morphine Nausea And Vomiting   Morphine And Related Nausea And Vomiting    Medications Prior to Admission  Medication Sig Dispense Refill   clotrimazole (ANTIFUNGAL CLOTRIMAZOLE) 1 % cream Apply 1 Application topically 2 (two) times daily. (Patient not taking: Reported on 01/18/2022) 30 g 0   dicyclomine (BENTYL) 20 MG tablet Take 1 tablet (20 mg total) by mouth 3 (three) times daily as needed for up to 21 doses for spasms. 21 tablet 0   famotidine (PEPCID) 20 MG tablet Take 1 tablet (20 mg total) by mouth daily for 30 doses. 30 tablet 0   fluticasone (FLONASE) 50 MCG/ACT nasal spray Place 2 sprays into both nostrils daily. 16 g 0   levocetirizine (XYZAL) 5 MG tablet Take 1 tablet (5 mg total) by mouth every evening. 30 tablet 0   levofloxacin (LEVAQUIN) 500 MG tablet Take 1 tablet (500 mg total) by mouth daily. (Patient not taking: Reported on 12/23/2021) 7 tablet 0   lisinopril-hydrochlorothiazide (ZESTORETIC) 20-25 MG tablet Take 1 tablet by mouth daily. 90 tablet 1   methocarbamol (ROBAXIN) 750 MG tablet Take 1 tablet (750 mg total) by mouth 3 (three) times daily. (Patient not taking: Reported on 12/23/2021) 15 tablet 0   naproxen (NAPROSYN) 500  MG tablet Take 1 tablet (500 mg total) by mouth 2 (two) times daily with a meal. (Patient not taking: Reported on 04/16/2014) 20 tablet 0   ondansetron (ZOFRAN-ODT) 4 MG disintegrating tablet Take 1 tablet (4 mg total) by mouth every 8 (eight) hours as needed for up to 15 doses for nausea or vomiting. 15 tablet 0   pantoprazole (PROTONIX) 20 MG tablet Take 1 tablet (20 mg total) by mouth daily. 30 tablet 0   sulfamethoxazole-trimethoprim (BACTRIM DS) 800-160 MG tablet Take 1 tablet by mouth 2 (two) times daily for 5 days. 14 tablet 0    ROS: As per HPI. At the time of evaluation in the Neuro ICU, the patient is drowsy and partially compliant  with interview, precluding a detailed ROS. She has no pain complaints.    Physical Examination: Blood pressure 127/70, pulse 86, temperature 97.9 F (36.6 C), resp. rate 16, weight (!) 175.5 kg, last menstrual period 12/11/2021, SpO2 94 %.  General: Morbidly obese HEENT-  [place]/AT Lungs - Respirations unlabored Extremities - No edema  Neurologic Examination: Mental Status: Drowsy. Speech is slow without definite dysarthria in the context of drowsiness. Oriented to the city, state, year and month but not the day of the week. Naming intact for her thumb and pinky, but incorrectly identified her index finger. Comprehension intact for all basic commands and questions. Speech is sparse, but fluent. Impaired repetition.  Cranial Nerves: II:  PERRL Not cooperative with visual fields testing due to continually re-closing her eyes after opening, but states that she can only see the right side of examiner's hand when held directly in front of her.  III,IV, VI: Eyes are mildly exotropic and near the midline. Did not cooperate with testing of EOM. No nystagmus.   V: Temp sensation subjectively absent on the left.   VII: Grimace is weak but symmetric VIII: Hearing intact to voice IX,X: Hypophonic speech in the context of drowsiness XI: Head is midline XII: Midline tongue extension Motor: RUE 4/5 proximally and distally with poor effort LUE 4-/5 proximally and 3/5 distally with poor effort and rapid fatiguability  RLE 2/5 HF and 4-/5 KE/APF/ADF in the context of drowsiness and poor effort LLE extends at knee with 3/5 strength, otherwise with no other movement in the context of poor effort.  Sensory: Subjectively with absent temp and FT to LUE and LLE.  Deep Tendon Reflexes: 1+ bilateral brachioradialis and left patella. Unable to elicit right patellar or bilateral achilles reflexes. Exam compromised by prominent adiposity.  Cerebellar: No ataxia with FNF on the right. Does not perform on the left.   Gait: Deferred  Results for orders placed or performed during the hospital encounter of 01/22/22 (from the past 48 hour(s))  Protime-INR     Status: None   Collection Time: 01/22/22  3:58 PM  Result Value Ref Range   Prothrombin Time 12.9 11.4 - 15.2 seconds   INR 1.0 0.8 - 1.2    Comment: (NOTE) INR goal varies based on device and disease states. Performed at [facility], [address], [place], [place] [address]   APTT     Status: None   Collection Time: 01/22/22  3:58 PM  Result Value Ref Range   aPTT 30 24 - 36 seconds    Comment: Performed at [facility], [address] [place], [place] [address]  CBC     Status: Abnormal   Collection Time: 01/22/22  3:58 PM  Result Value Ref Range   WBC  12.0 (H) 4.0 - 10.5 K/uL   RBC 4.63 3.87 - 5.11 MIL/uL   Hemoglobin 12.7 12.0 - 15.0 g/dL   HCT 25.0 53.9 - 76.7 %   MCV 84.7 80.0 - 100.0 fL   MCH 27.4 26.0 - 34.0 pg   MCHC 32.4 30.0 - 36.0 g/dL   RDW 34.1 93.7 - 90.2 %   Platelets 324 150 - 400 K/uL   nRBC 0.0 0.0 - 0.2 %    Comment: Performed at [facility], [address] [place], [place] [address]  Differential     Status: None   Collection Time: 01/22/22  3:58 PM  Result Value Ref Range   Neutrophils Relative % 63 %   Neutro Abs 7.6 1.7 - 7.7 K/uL   Lymphocytes Relative 24 %   Lymphs Abs 2.9 0.7 - 4.0 K/uL   Monocytes Relative 8 %   Monocytes Absolute 0.9 0.1 - 1.0 K/uL   Eosinophils Relative 4 %   Eosinophils Absolute 0.5 0.0 - 0.5 K/uL   Basophils Relative 1 %   Basophils Absolute 0.1 0.0 - 0.1 K/uL   Immature Granulocytes 0 %   Abs Immature Granulocytes 0.04 0.00 - 0.07 K/uL    Comment: Performed at [facility], [address], [place], [place] [address]  Comprehensive metabolic panel     Status: Abnormal   Collection Time: 01/22/22  3:58 PM  Result Value Ref Range   Sodium 137 135 - 145 mmol/L   Potassium 3.4 (L) 3.5 - 5.1  mmol/L   Chloride 101 98 - 111 mmol/L   CO2 26 22 - 32 mmol/L   Glucose, Bld 82 70 - 99 mg/dL    Comment: Glucose reference range applies only to samples taken after fasting for at least 8 hours.   BUN 19 6 - 20 mg/dL   Creatinine, Ser 2.42 (H) 0.44 - 1.00 mg/dL   Calcium 9.4 8.9 - 68.3 mg/dL   Total Protein 7.5 6.5 - 8.1 g/dL   Albumin 4.0 3.5 - 5.0 g/dL   AST 14 (L) 15 - 41 U/L   ALT 11 0 - 44 U/L   Alkaline Phosphatase 97 38 - 126 U/L   Total Bilirubin 0.3 0.3 - 1.2 mg/dL   GFR, Estimated 36 (L) >60 mL/min    Comment: (NOTE) Calculated using the CKD-EPI Creatinine Equation (2021)    Anion gap 10 5 - 15    Comment: Performed at [facility], [address] [place], [place] [address]  Ethanol     Status: None   Collection Time: 01/22/22  3:58 PM  Result Value Ref Range   Alcohol, Ethyl (B) <10 <10 mg/dL    Comment: (NOTE) Lowest detectable limit for serum alcohol is 10 mg/dL.  For medical purposes only. Performed at [facility], [address] [place], [place] [address]   hCG, serum, qualitative     Status: None   Collection Time: 01/22/22  3:58 PM  Result Value Ref Range   Preg, Serum NEGATIVE NEGATIVE    Comment:        THE SENSITIVITY OF THIS METHODOLOGY IS >10 mIU/mL. Performed at [facility], [address] [place], [place] [address]   CBG monitoring, ED     Status: None   Collection Time: 01/22/22  4:45 PM  Result Value Ref Range   Glucose-Capillary 79 70 - 99 mg/dL    Comment: Glucose reference range applies only to samples taken after fasting for at  least 8 hours.   CT ANGIO HEAD NECK W WO CM (CODE STROKE)  Result Date: 01/22/2022 CLINICAL DATA:  Syncopal episode. Left-sided weakness. Possible right facial droop. EXAM: CT ANGIOGRAPHY HEAD AND NECK TECHNIQUE: Multidetector CT imaging of the head and neck was performed using the standard protocol during bolus administration of intravenous  contrast. Multiplanar CT image reconstructions and MIPs were obtained to evaluate the vascular anatomy. Carotid stenosis measurements (when applicable) are obtained utilizing NASCET criteria, using the distal internal carotid diameter as the denominator. RADIATION DOSE REDUCTION: This exam was performed according to the departmental dose-optimization program which includes automated exposure control, adjustment of the mA and/or kV according to patient size and/or use of iterative reconstruction technique. CONTRAST:  59mL OMNIPAQUE IOHEXOL 350 MG/ML SOLN COMPARISON:  CT head without contrast 01/22/2022 FINDINGS: CTA NECK FINDINGS Aortic arch: A 3 vessel arch configuration is present. No focal vascular abnormality is present. Right carotid system: Right common carotid artery is within normal limits. Bifurcation is unremarkable. Cervical right ICA is normal. Left carotid system: Left common carotid artery is within normal limits. Motion artifact is present at the bifurcation. No significant stenosis is present. Cervical left ICA is normal. Vertebral arteries: Left vertebral artery is the dominant vessel. Both vertebral arteries originate the subclavian arteries. No significant stenosis is present in either vertebral artery in the neck. Skeleton: Mild endplate degenerative changes are present at C5-6 and C6-7. No focal osseous lesions are present. Other neck: Soft tissues the neck are otherwise unremarkable. Salivary glands are within normal limits. Thyroid is normal. No significant adenopathy is present. No focal mucosal or submucosal lesions are present. Upper chest: Lung apices are clear. Thoracic inlet is within limits. Review of the MIP images confirms the above findings CTA HEAD FINDINGS Anterior circulation: Internal carotid arteries are within normal limits from the skull base the ICA termini bilaterally. The A1 and M1 segments are normal. The anterior communicating artery is patent. ACA and MCA branch vessels  are within normal limits bilaterally. Posterior circulation: Left vertebral artery is dominant. PICA origin is visualized and normal. Vertebrobasilar junction basilar artery is normal. Both posterior cerebral arteries originate from basilar tip. PCA branch vessels are normal bilaterally. Venous sinuses: The dural sinuses are patent. The straight sinus and deep cerebral veins are intact. Cortical veins are within normal limits. No significant vascular malformation is evident. Review of the MIP images confirms the above findings IMPRESSION: 1. Normal variant CTA Circle of Willis without significant proximal stenosis, aneurysm, or branch vessel occlusion. 2. Normal CTA of the neck.  No focal stenosis. 3. Mild endplate degenerative changes at C5-6 and C6-7. Electronically Signed   By: San Morelle M.D.   On: 01/22/2022 18:41   CT HEAD CODE STROKE WO CONTRAST`  Result Date: 01/22/2022 CLINICAL DATA:  Code stroke. Syncope/presyncope. Cerebrovascular cause suspected. Patient became dizzy passed out for 15-20 minutes. New onset left-sided weakness numbness. EXAM: CT HEAD WITHOUT CONTRAST TECHNIQUE: Contiguous axial images were obtained from the base of the skull through the vertex without intravenous contrast. RADIATION DOSE REDUCTION: This exam was performed according to the departmental dose-optimization program which includes automated exposure control, adjustment of the mA and/or kV according to patient size and/or use of iterative reconstruction technique. COMPARISON:  None Available. FINDINGS: Brain: No acute infarct, hemorrhage, or mass lesion is present. No significant white matter lesions are present. The ventricles are of normal size. Deep brain nuclei are within normal limits. No significant extraaxial fluid collection is present. The brainstem and  cerebellum are within normal limits. Vascular: No hyperdense vessel or unexpected calcification. Skull: Calvarium is intact. No focal lytic or blastic  lesions are present. No significant extracranial soft tissue lesion is present. Sinuses/Orbits: The paranasal sinuses and mastoid air cells are clear. The globes and orbits are within normal limits. ASPECTS [facility]([place] Stroke Program Early CT Score) - Ganglionic level infarction (caudate, lentiform nuclei, internal capsule, insula, M1-M3 cortex): 7/7 - Supraganglionic infarction (M4-M6 cortex): 3/3 Total score (0-10 with 10 being normal): 10/10 IMPRESSION: 1. Normal CT of the head. 2. Aspects is 10/10. These results were called by telephone at the time of interpretation on 01/22/2022 at 4:13 pm to provider [facility] , who verbally acknowledged these results. Electronically Signed   By: Marin Robertshristopher  Mattern M.D.   On: 01/22/2022 16:15     Assessment: 46 year old Caucasian lady with brief episode of loss of consciousness followed by left-sided weakness and numbness and headache.  Patient neurological exam suggests possible small right subcortical infarct though exam does have some nonorganic features.  Unfortunately MRI scan of the brain is not available [facility] at this time Patient has presented within time window for thrombolysis.  I have explained risk-benefit alternatives to the patient including specifically 4- 6% risk of intracerebral hemorrhage as well as bleeding.  I personally reviewed patient's chart as well as inclusion exclusion criteria and did not find  r any obvious contraindications to TNK administration - Exam [facility] to [facility] reveals left sided weakness and subjective sensory loss, with no facial droop.  - CT head: Normal CT of the head. Aspects is 10/10. - CTA of head: Normal variant CTA Circle of Willis without significant proximal stenosis, aneurysm, or branch vessel occlusion.  - CTA of neck: Normal CTA of the neck.  No focal stenosis.  - Stroke risk factors: Supermorbid obesity (BMI > 50), HTN and history of PE   Plan:  - Admitting to the Neuro ICU under the  Neurology service.  - No IV sticks and noncompressible sites for 24 hours - Post-TNK order set to include frequent neuro checks and BP management.  - No antiplatelet medications or anticoagulants for at least 24 hours following TNK  - DVT prophylaxis with SCDs.  - Will need to be started on a statin.  - Will need to be started on antiplatelet therapy if follow up CT at 24 hours is negative for hemorrhagic conversion. - TTE.  - MRI brain - PT/OT/Speech.  - NPO until passes swallow evaluation.  - Telemetry monitoring - Fasting lipid panel, HgbA1c - Risk factor management to include optimization of diet and exercise regimen at home  Addendum: MRI brain normal. Negative for acute stroke  Addendum: - Called for recrudescence of lateral abdominal pain, which patient had been seen for at ED visit on 10/18 and prescribed ABX, initially Bactrim which was switched to Macrobid due to ABX-resistant organism. She states that she has not yet started the Macrobid.  - Will give PRN rectal Tylenol for her pain (failed initial swallow eval by RN) - Will order U/A and urine culture - Macrobid 100 mg po BID has been ordered in anticipation of her passing her next swallow eval  Electronically signed: Dr. Caryl PinaEric Maurizio Geno 01/22/2022, 10:38 PM

## 2022-01-22 NOTE — Consult Note (Signed)
[facility] Telemedicine Consult   Requesting Provider: Dr. Theresia Lo Consult Participants: Patient, [facility] RN Jena Gauss ER RN Corrie Dandy Location of the provider: [facility] Location of the patient: [facility]  This consult was provided via telemedicine with 2-way video and audio communication. The patient/family was informed that care would be provided in this way and agreed to receive care in this manner.    Chief Complaint: Head, left-sided weakness and numbness-code stroke  HPI: Tracie Green is a 46 year old Caucasian lady with past medical history of hypertension, allergic rhinitis, Candida intertrigo and recent abdominal pain.  She states she was at discharge singing in the choir when at 1450 she had an episode of feeling dizzy and lightheaded and passing out in the chair.  She states she did not fall or hurt herself.  The people around the chair apparently did not call EMS immediately.  She states she was out for 15 to 20 minutes and woke up people throughout the pouring water on her head.  She complained of headache and left-sided weakness and numbness which has persisted.  She is also complaining of pain on the left side of her body but is not able to localize it.  Patient states her symptoms have not improved and a persistent Denies prior history of strokes, TIA, seizures, migraine headaches.  She was seen in the ER on 01/18/2022 for abdominal pain and diarrhea.  She was treated with Bactrim and subsequently changed to [facility] for UTI   LKW: 1350. Onset yes TNK given : Yes IR Thrombectomy? No, clinical exam not c/w LVO Modified Rankin Scale: 1-No significant post stroke disability and can perform usual duties with stroke symptoms Time of teleneurologist evaluation: 1614   Exam: Vitals:   01/22/22 1535  BP: 115/83  Pulse: (!) 114  Resp: 18  Temp: 99.2 F (37.3 C)  SpO2: 100%    General:  Obese middle-age Caucasian lady not in distress. .  Afebrile. Head is nontraumatic. Neck is supple without bruit.    Cardiac exam no murmur or gallop. Lungs are clear to auscultation. Distal pulses are well felt.   1A: Level of Consciousness - 0 1B: Ask Month and Age - 1 1C: 'Blink Eyes' & 'Squeeze Hands' - 0 2: Test Horizontal Extraocular Movements - 0 3: Test Visual Fields - 0 4: Test Facial Palsy - 0 5A: Test Left Arm Motor Drift - 2 5B: Test Right Arm Motor Drift - 0 6A: Test Left Leg Motor Drift - 2 6B: Test Right Leg Motor Drift - 0 7: Test Limb Ataxia - 0 8: Test Sensation - 1 9: Test Language/Aphasia- 0 10: Test Dysarthria - 0 11: Test Extinction/Inattention - 0 NIHSS score: 6 Premorbid modified Rankin score 1  Patient is awake alert and interactive.  Speech is clear without dysarthria or aphasia.  She follows commands well.  Extraocular movements are full range without nystagmus.  Face is symmetric without weakness.  Tongue is midline.  Patient has poor effort on motor testing on the left side and both left arm and left leg drift in the ground.  Patient`s effort appears to be poor .She has good strength on the right without weakness.  She has subjective decreased touch and pinprick sensation on the left hemibody including the face and forehead.  Gait was not tested.  Imaging Reviewed: CT head without contrast shows no acute abnormality.  Aspects score is 10  Labs reviewed in epic and pertinent values follow: Pending at this time  Assessment: 46 year old Caucasian lady with brief episode of loss of consciousness followed by left-sided weakness and numbness and headache.  Patient neurological exam suggests possible small right subcortical infarct though exam does have some nonorganic features.  Unfortunately MRI scan of the brain is not available at [facility] ER at this time Patient has presented within time window for thrombolysis.  I have explained risk-benefit alternatives to the patient including specifically 4- 6% risk of  intracerebral hemorrhage as well as bleeding.  I personally reviewed patient's chart as well as inclusion exclusion criteria and did not find  r any obvious contraindications to TNK administration Recommendations:; IV TNK 0.25 mg/kg as per thrombolytic protocol.  Neuro checks and vital signs as per thrombolytic protocol.  Strict blood pressure control as per thrombolytic protocol.  No IV sticks and noncompressible sites for 24 hours Transfer  to [facility] and admitted to neuro ICU under stroke MD as attending. Check CT angiogram of the brain and neck, MRI scan of the brain, echocardiogram, lipid profile, hemoglobin A1c Case discussed with Dr. Maylon Peppers ER MD and answered questions.  Case discussed with Dr. Kathrynn Speed with the hospitalist on-call for [facility].    This patient is critically ill and at significant risk of neurological worsening, death and care requires constant monitoring of vital signs, hemodynamics,respiratory and cardiac monitoring, extensive review of multiple databases, frequent neurological assessment, discussion with family, other specialists and medical decision making of high complexity.I have made any additions or clarifications directly to the above note.This critical care time does not reflect procedure time, or teaching time or supervisory time of PA/NP/Med Resident etc but could involve care discussion time.  I spent 50 minutes of neurocritical care time  in the care of  this patient.    Tracie Contras, MD   [facility] [phone]  If 7pm- 7am, please page neurology on call as listed in [place].

## 2022-01-22 NOTE — ED Provider Notes (Signed)
[facility] DEPT Provider Note   CSN: [id number] Arrival date & time: 01/22/22  1523  An emergency department physician performed an initial assessment on this suspected stroke patient at 1536.  History  Chief Complaint  Patient presents with  . Code Stroke    Tracie Green is a 46 y.o. female.  Patient is a 46 year old female with a past medical history of hypertension presenting to the emergency department with numbness and weakness after a syncopal event.  Patient states that she was at church earlier this morning and was singing in the choir when he passed out.  She reports that she did feel lightheaded and dizzy prior to passing out but denied any chest pain or shortness of breath.  This was witnessed by family member who states that she was out for about 15 to 20 minutes.  After coming to, the patient reported that she had a headache and some left-sided weakness as well as right-sided numbness.  She denies any vision changes or changes in her speech.  She does report that she was recently seen in the ER and treated for a UTI.  The history is provided by the patient and a relative. History limited by: Critical acuity.       Home Medications Prior to Admission medications   Medication Sig Start Date End Date Taking? Authorizing Provider  clotrimazole (ANTIFUNGAL CLOTRIMAZOLE) 1 % cream Apply 1 Application topically 2 (two) times daily. Patient not taking: Reported on 01/18/2022 12/23/21   Alvira Monday, FNP  dicyclomine (BENTYL) 20 MG tablet Take 1 tablet (20 mg total) by mouth 3 (three) times daily as needed for up to 21 doses for spasms. 01/18/22   Wyvonnia Dusky, MD  famotidine (PEPCID) 20 MG tablet Take 1 tablet (20 mg total) by mouth daily for 30 doses. 01/18/22 02/17/22  Wyvonnia Dusky, MD  fluticasone (FLONASE) 50 MCG/ACT nasal spray Place 2 sprays into both nostrils daily. 12/23/21   Alvira Monday, FNP  levocetirizine (XYZAL) 5 MG tablet Take  1 tablet (5 mg total) by mouth every evening. 12/23/21   Alvira Monday, FNP  levofloxacin (LEVAQUIN) 500 MG tablet Take 1 tablet (500 mg total) by mouth daily. Patient not taking: Reported on 12/23/2021 08/01/21   Evalee Jefferson, PA-C  lisinopril-hydrochlorothiazide (ZESTORETIC) 20-25 MG tablet Take 1 tablet by mouth daily. 12/23/21   Alvira Monday, FNP  methocarbamol (ROBAXIN) 750 MG tablet Take 1 tablet (750 mg total) by mouth 3 (three) times daily. Patient not taking: Reported on 12/23/2021 06/07/21   Evalee Jefferson, PA-C  naproxen (NAPROSYN) 500 MG tablet Take 1 tablet (500 mg total) by mouth 2 (two) times daily with a meal. Patient not taking: Reported on 04/16/2014 01/22/14   Triplett, Tammy, PA-C  ondansetron (ZOFRAN-ODT) 4 MG disintegrating tablet Take 1 tablet (4 mg total) by mouth every 8 (eight) hours as needed for up to 15 doses for nausea or vomiting. 01/18/22   Wyvonnia Dusky, MD  pantoprazole (PROTONIX) 20 MG tablet Take 1 tablet (20 mg total) by mouth daily. 12/23/21   Alvira Monday, FNP  sulfamethoxazole-trimethoprim (BACTRIM DS) 800-160 MG tablet Take 1 tablet by mouth 2 (two) times daily for 5 days. 01/18/22 01/23/22  Wyvonnia Dusky, MD      Allergies    Penicillins, Morphine, and Morphine and related    Review of Systems   Review of Systems  Physical Exam Updated Vital Signs BP 131/70 (BP Location: Left Arm)   Pulse 94   Temp 99.2  F (37.3 C)   Resp 17   Wt (!) 175.5 kg   LMP 12/11/2021   SpO2 99%   BMI 55.53 kg/m  Physical Exam Vitals and nursing note reviewed.  Constitutional:      General: She is not in acute distress.    Appearance: Normal appearance. She is obese.  HENT:     Head: Normocephalic and atraumatic.     Nose: Nose normal.     Mouth/Throat:     Mouth: Mucous membranes are moist.     Pharynx: Oropharynx is clear.     Comments: Tongue deviation to the right Eyes:     Extraocular Movements: Extraocular movements intact.     Conjunctiva/sclera:  Conjunctivae normal.     Pupils: Pupils are equal, round, and reactive to light.  Cardiovascular:     Rate and Rhythm: Regular rhythm. Tachycardia present.     Pulses: Normal pulses.     Heart sounds: Normal heart sounds.  Pulmonary:     Effort: Pulmonary effort is normal.     Breath sounds: Normal breath sounds.  Abdominal:     General: Abdomen is flat.     Palpations: Abdomen is soft.     Tenderness: There is no abdominal tenderness.  Musculoskeletal:        General: Normal range of motion.     Cervical back: Normal range of motion and neck supple.  Skin:    General: Skin is warm and dry.  Neurological:     Mental Status: She is alert and oriented to person, place, and time.     Comments: Questionable right-sided facial droop with decreased sensation on the right side of the face and a tongue deviation to the right Objectively decreased sensation in right upper and right lower extremity Positive drift in left upper and left lower extremity, no drift in right upper and right lower extremity Sensation intact in left upper and left lower extremity  Psychiatric:        Mood and Affect: Mood normal.        Behavior: Behavior normal.     ED Results / Procedures / Treatments   Labs (all labs ordered are listed, but only abnormal results are displayed) Labs Reviewed  CBC - Abnormal; Notable for the following components:      Result Value   WBC 12.0 (*)    All other components within normal limits  PROTIME-INR  APTT  DIFFERENTIAL  HCG, SERUM, QUALITATIVE  COMPREHENSIVE METABOLIC PANEL  ETHANOL  CBG MONITORING, ED    EKG EKG Interpretation  Date/Time:  Sunday January 22 2022 15:52:25 EDT Ventricular Rate:  110 PR Interval:  132 QRS Duration: 84 QT Interval:  338 QTC Calculation: 457 R Axis:   25 Text Interpretation: Sinus tachycardia Otherwise normal ECG When compared with ECG of 24-Jan-2021 21:12, No significant change was found Confirmed by Leanord Asal ([id number])  on 01/22/2022 5:12:08 PM  Radiology CT HEAD CODE STROKE WO CONTRAST`  Result Date: 01/22/2022 CLINICAL DATA:  Code stroke. Syncope/presyncope. Cerebrovascular cause suspected. Patient became dizzy passed out for 15-20 minutes. New onset left-sided weakness numbness. EXAM: CT HEAD WITHOUT CONTRAST TECHNIQUE: Contiguous axial images were obtained from the base of the skull through the vertex without intravenous contrast. RADIATION DOSE REDUCTION: This exam was performed according to the departmental dose-optimization program which includes automated exposure control, adjustment of the mA and/or kV according to patient size and/or use of iterative reconstruction technique. COMPARISON:  None Available. FINDINGS: Brain: No acute infarct,  hemorrhage, or mass lesion is present. No significant white matter lesions are present. The ventricles are of normal size. Deep brain nuclei are within normal limits. No significant extraaxial fluid collection is present. The brainstem and cerebellum are within normal limits. Vascular: No hyperdense vessel or unexpected calcification. Skull: Calvarium is intact. No focal lytic or blastic lesions are present. No significant extracranial soft tissue lesion is present. Sinuses/Orbits: The paranasal sinuses and mastoid air cells are clear. The globes and orbits are within normal limits. ASPECTS [facility] Stroke Program Early CT Score) - Ganglionic level infarction (caudate, lentiform nuclei, internal capsule, insula, M1-M3 cortex): 7/7 - Supraganglionic infarction (M4-M6 cortex): 3/3 Total score (0-10 with 10 being normal): 10/10 IMPRESSION: 1. Normal CT of the head. 2. Aspects is 10/10. These results were called by telephone at the time of interpretation on 01/22/2022 at 4:13 pm to provider [facility] , who verbally acknowledged these results. Electronically Signed   By: San Morelle M.D.   On: 01/22/2022 16:15    Procedures .Critical Care  Performed by: Kemper Durie, DO Authorized by: Kemper Durie, DO   Critical care provider statement:    Critical care time (minutes):  45   Critical care time was exclusive of:  Separately billable procedures and treating other patients   Critical care was necessary to treat or prevent imminent or life-threatening deterioration of the following conditions:  CNS failure or compromise   Critical care was time spent personally by me on the following activities:  Development of treatment plan with patient or surrogate, discussions with consultants, evaluation of patient's response to treatment, examination of patient, obtaining history from patient or surrogate, ordering and performing treatments and interventions, ordering and review of laboratory studies, ordering and review of radiographic studies, pulse oximetry, re-evaluation of patient's condition and review of old charts   I assumed direction of critical care for this patient from another provider in my specialty: no     Care discussed with: admitting provider       Medications Ordered in ED Medications  sodium chloride flush (NS) 0.9 % injection 3 mL (has no administration in time range)  tenecteplase (TNKASE) injection for Stroke 25 mg (has no administration in time range)  tenecteplase (TNKASE) 50 MG injection for Stroke (has no administration in time range)    ED Course/ Medical Decision Making/ A&P Clinical Course as of 01/22/22 1949  Sun Jan 22, 2022  1616 Received a call from radiology without acute disease seen on [facility]. [VK]  1711 Patient was evaluated by telemetry neurology who recommended TNK for her acute deficits with a negative CT head.  The patient was consented for TNK by neurology and it was administered without any immediate complication.  Patient will be admitted to the neuro ICU at [facility] for further management of her concern for acute stroke. [VK]  1948 Patient complaining of headache, no new neurologic deficits talking to family  member on phone. Failed swallow screen and will be given reglan for headache. [VK]    Clinical Course User Index [VK] Kemper Durie, DO                           Medical Decision Making This patient presents to the ED with chief complaint(s) of syncope, numbness and weakness with pertinent past medical history of hypertension, obesity which further complicates the presenting complaint. The complaint involves an extensive differential diagnosis and also carries with it a high  risk of complications and morbidity.    The differential diagnosis includes CVA, ICH, mass effect, electrolyte abnormality, infection, anemia, hypo or hyperglycemia, arrhythmia  Additional history obtained: Additional history obtained from family Records reviewed previous ED records  ED Course and Reassessment: Upon patient's arrival to the emergency department I was asked to evaluate her in triage.  She did have an obvious drift in her left upper and left lower extremity as well as some right-sided tongue deviation.  She had questionable right-sided facial droop and subjectively decreased sensation on the right.  Stroke alert was called.  She was evaluated by telemetry neurology who upon evaluation was now having left-sided numbness in addition to her left-sided weakness.  EKG was performed that showed normal sinus rhythm at a rate in the 110s.  Accu-Chek was performed that was 79.  Her blood pressure was normal.  She was urgently transferred to CT scan to evaluate for ICH or mass effect.  Her last known well was around 2 PM today  Independent labs interpretation:  The following labs were independently interpreted: Mild leukocytosis otherwise within normal range  Independent visualization of imaging: - I independently visualized the following imaging with scope of interpretation limited to determining acute life threatening conditions related to emergency care: CT head, which revealed acute  disease  Consultation: - Consulted or discussed management/test interpretation w/ external professional: Neurology  Consideration for admission or further workup: Requires admission to neuro ICU for further monitoring after TNK work-up Social Determinants of health:    Amount and/or Complexity of Data Reviewed Labs: ordered. Radiology: ordered.  Risk Decision regarding hospitalization.          Final Clinical Impression(s) / ED Diagnoses Final diagnoses:  Cerebrovascular accident (CVA), unspecified mechanism [facility])    Rx / DC Orders ED Discharge Orders     None         Kemper Durie, DO 01/22/22 1713

## 2022-01-22 NOTE — ED Notes (Signed)
Patient transported to CT 

## 2022-01-22 NOTE — Progress Notes (Signed)
ED Antimicrobial Stewardship Positive Culture Follow Up   Tracie Green is an 46 y.o. female who presented to [facility] on 01/18/2022 with a chief complaint of N/V/D, abdominal pain and fever.   Chief Complaint  Patient presents with   Diarrhea    Recent Results (from the past 720 hour(s))  Urine Culture     Status: Abnormal   Collection Time: 01/18/22  4:17 PM   Specimen: Urine, Clean Catch  Result Value Ref Range Status   Specimen Description   Final    URINE, CLEAN CATCH Performed at [facility], [address], [place], [place] [address]    Special Requests   Final    NONE Performed at [facility], [address] [place], [place] [address]    Culture (A)  Final    >=100,000 COLONIES/mL ESCHERICHIA COLI Confirmed Extended Spectrum Beta-Lactamase Producer (ESBL).  In bloodstream infections from ESBL organisms, carbapenems are preferred over piperacillin/tazobactam. They are shown to have a lower risk of mortality.    Report Status 01/21/2022 FINAL  Final   Organism ID, Bacteria ESCHERICHIA COLI (A)  Final      Susceptibility   Escherichia coli - MIC*    AMPICILLIN >=32 RESISTANT Resistant     CEFAZOLIN >=64 RESISTANT Resistant     CEFEPIME 16 RESISTANT Resistant     CEFTRIAXONE >=64 RESISTANT Resistant     CIPROFLOXACIN >=4 RESISTANT Resistant     GENTAMICIN <=1 SENSITIVE Sensitive     IMIPENEM <=0.25 SENSITIVE Sensitive     NITROFURANTOIN <=16 SENSITIVE Sensitive     TRIMETH/SULFA >=320 RESISTANT Resistant     AMPICILLIN/SULBACTAM >=32 RESISTANT Resistant     PIP/TAZO <=4 SENSITIVE Sensitive     * >=100,000 COLONIES/mL ESCHERICHIA COLI  SARS Coronavirus 2 by RT PCR (hospital order, performed in [facility] hospital lab) *cepheid single result test* Anterior Nasal Swab     Status: None   Collection Time: 01/18/22 10:37 PM   Specimen: Anterior Nasal Swab  Result Value Ref Range Status   SARS Coronavirus 2 by RT PCR NEGATIVE  NEGATIVE Final    Comment: (NOTE) SARS-CoV-2 target nucleic acids are NOT DETECTED.  The SARS-CoV-2 RNA is generally detectable in upper and lower respiratory specimens during the acute phase of infection. The lowest concentration of SARS-CoV-2 viral copies this assay can detect is 250 copies / mL. A negative result does not preclude SARS-CoV-2 infection and should not be used as the sole basis for treatment or other patient management decisions.  A negative result may occur with improper specimen collection / handling, submission of specimen other than nasopharyngeal swab, presence of viral mutation(s) within the areas targeted by this assay, and inadequate number of viral copies (<250 copies / mL). A negative result must be combined with clinical observations, patient history, and epidemiological information.  Fact Sheet for Patients:   RoadLapTop.co.za  Fact Sheet for Healthcare Providers: http://kim-miller.com/  This test is not yet approved or  cleared by the [place] FDA and has been authorized for detection and/or diagnosis of SARS-CoV-2 by FDA under an Emergency Use Authorization (EUA).  This EUA will remain in effect (meaning this test can be used) for the duration of the COVID-19 declaration under Section 564(b)(1) of the Act, 21 U.S.C. section 360bbb-3(b)(1), unless the authorization is terminated or revoked sooner.  Performed at [facility], [address], [place], [place] [address]     [x]  Treated with Bactrim , organism resistant to prescribed  antimicrobial []  Patient discharged originally without antimicrobial agent and treatment is now indicated  Plan:  1) d/c bactrim 2) start Macrobid 100 mg PO bid x5 days 3) advise pt to follow up with her PCP   ED Provider: Sherrill Raring, PA-C   Lynelle Doctor 01/22/2022, 8:26 AM Clinical Pharmacist [phone]

## 2022-01-22 NOTE — Consult Note (Signed)
Code stroke activated at 1608. Pt in triage room at this time after returning from [facility].   Dr Leonie Man paged at 1609 and on camera at 1612.NIH exam began at 1614.  TNK discussion at 1625.  Pt moved to regular room at 1635.  Decision for TNK at 1639 with order placed at 1642.  IV placed at 1645.  TNK bolus at 1650. Reminded RN of post TNK vitals and neuro check times.   Plans to tx to [facility] for post TNK monitoring. Off camera at 1721.  Biagio Borg, Telestroke RN

## 2022-01-22 NOTE — ED Triage Notes (Addendum)
Patient comes in after going to church. Reports feeling dizzy and falling at church. Happened about 2:30 today. Reports being "passed out for about 15-20 mintures" Reports weakness  on left side.  PA requested to triage for assessment

## 2022-01-22 NOTE — Telephone Encounter (Signed)
Post ED Visit - Positive Culture Follow-up: Unsuccessful Patient Follow-up  Culture assessed and recommendations reviewed by:  []  Elenor Quinones, Pharm.D. []  Heide Guile, Pharm.D., BCPS AQ-ID []  Parks Neptune, Pharm.D., BCPS []  Alycia Rossetti, Pharm.D., BCPS [x]  Onh Pham, Pharm.D.,  []  Legrand Como, Pharm.D., BCPS, AAHIVP []  Wynell Balloon, PharmD []  Vincenza Hews, PharmD, BCPS  Positive urine culture  []  Patient discharged without antimicrobial prescription and treatment is now indicated [x]  Organism is resistant to prescribed ED discharge antimicrobial []  Patient with positive blood cultures  Plan: D/C Bactrim Start Macrobid 100mg  PO BID x 5 days and follow up with PCP per Sherrill Raring, PA-C  Unable to contact patient , letter will be sent to address on file  Rosie Fate 01/22/2022, 9:12 AM

## 2022-01-22 NOTE — ED Notes (Signed)
Returned from CT; tele-neuro cart activated.Pt states all the issues are on the left side; previously it was determined that the weakness was on her left and the numbness on the right (based on exam by Dr. Maylon Peppers); pt also reports that she has been in the hospital this week with pain on her left side.

## 2022-01-23 ENCOUNTER — Inpatient Hospital Stay (HOSPITAL_COMMUNITY): Payer: BLUE CROSS/BLUE SHIELD

## 2022-01-23 DIAGNOSIS — I6389 Other cerebral infarction: Secondary | ICD-10-CM | POA: Diagnosis not present

## 2022-01-23 LAB — URINALYSIS, MICROSCOPIC (REFLEX)

## 2022-01-23 LAB — LIPID PANEL
Cholesterol: 127 mg/dL (ref 0–200)
HDL: 46 mg/dL (ref >40)
LDL Cholesterol: 71 mg/dL (ref 0–99)
Total CHOL/HDL Ratio: 2.8 RATIO
Triglycerides: 52 mg/dL (ref <150)
VLDL: 10 mg/dL (ref 0–40)

## 2022-01-23 LAB — URINALYSIS, ROUTINE W REFLEX MICROSCOPIC
Bilirubin Urine: NEGATIVE
Glucose, UA: NEGATIVE mg/dL
Hgb urine dipstick: NEGATIVE
Ketones, ur: NEGATIVE mg/dL
Nitrite: POSITIVE — AB
Protein, ur: NEGATIVE mg/dL
Specific Gravity, Urine: 1.025 (ref 1.005–1.030)
pH: 5.5 (ref 5.0–8.0)

## 2022-01-23 LAB — SURGICAL PCR SCREEN
MRSA, PCR: NEGATIVE
Staphylococcus aureus: POSITIVE — AB

## 2022-01-23 MED ORDER — LISINOPRIL 20 MG PO TABS
20.0000 mg | ORAL_TABLET | Freq: Every day | ORAL | Status: DC
  Administered 2022-01-23 – 2022-01-26 (×4): 20 mg via ORAL
  Filled 2022-01-23 (×4): qty 1

## 2022-01-23 MED ORDER — TRAMADOL HCL 50 MG PO TABS
50.0000 mg | ORAL_TABLET | Freq: Four times a day (QID) | ORAL | Status: DC | PRN
  Administered 2022-01-23: 50 mg via ORAL
  Filled 2022-01-23: qty 1

## 2022-01-23 MED ORDER — PANTOPRAZOLE SODIUM 40 MG PO TBEC
40.0000 mg | DELAYED_RELEASE_TABLET | Freq: Every day | ORAL | Status: DC
  Administered 2022-01-23 – 2022-01-25 (×3): 40 mg via ORAL
  Filled 2022-01-23 (×3): qty 1

## 2022-01-23 MED ORDER — HYDROCHLOROTHIAZIDE 25 MG PO TABS
25.0000 mg | ORAL_TABLET | Freq: Every day | ORAL | Status: DC
  Administered 2022-01-23 – 2022-01-26 (×4): 25 mg via ORAL
  Filled 2022-01-23 (×4): qty 1

## 2022-01-23 MED ORDER — MUPIROCIN 2 % EX OINT
1.0000 | TOPICAL_OINTMENT | Freq: Two times a day (BID) | CUTANEOUS | Status: DC
  Administered 2022-01-23 – 2022-01-26 (×8): 1 via NASAL
  Filled 2022-01-23 (×2): qty 22

## 2022-01-23 MED ORDER — LISINOPRIL-HYDROCHLOROTHIAZIDE 20-25 MG PO TABS: 1.0000 | ORAL_TABLET | Freq: Every day | ORAL | Status: DC

## 2022-01-23 MED ORDER — NITROFURANTOIN MONOHYD MACRO 100 MG PO CAPS
100.0000 mg | ORAL_CAPSULE | Freq: Two times a day (BID) | ORAL | Status: AC
  Administered 2022-01-23 – 2022-01-25 (×5): 100 mg via ORAL
  Filled 2022-01-23 (×6): qty 1

## 2022-01-23 MED ORDER — BUTALBITAL-APAP-CAFFEINE 50-325-40 MG PO TABS
1.0000 | ORAL_TABLET | Freq: Four times a day (QID) | ORAL | Status: DC | PRN
  Administered 2022-01-23 – 2022-01-25 (×4): 1 via ORAL
  Filled 2022-01-23 (×4): qty 1

## 2022-01-23 MED ORDER — ATORVASTATIN CALCIUM 40 MG PO TABS
40.0000 mg | ORAL_TABLET | Freq: Every day | ORAL | Status: DC
  Administered 2022-01-23 – 2022-01-26 (×4): 40 mg via ORAL
  Filled 2022-01-23 (×4): qty 1

## 2022-01-23 NOTE — Progress Notes (Signed)
Echo attempted. Patient care in progress. Will attempt again as schedule permits.  

## 2022-01-23 NOTE — Progress Notes (Addendum)
STROKE TEAM PROGRESS NOTE   INTERVAL HISTORY Patient is seen in her room with no family at the bedside.  Yesterday, she experienced an episode of dizziness and lightheadedness with loss of consciousness for about 20 minutes.  After patient regained consciousness, she had headache, left sided weakness and left sided numbness.  TNK was given.  Patient continues to have left sided numbness with splitting at the midline for touch sensation as well as vibration and positive Hoover sign.  MRI is negative for acute stroke.  Vitals:   01/23/22 0500 01/23/22 0600 01/23/22 0700 01/23/22 0800  BP: 113/60 (!) 140/57 134/74   Pulse: 78 75 65   Resp: 20 14 11    Temp:    98.3 F (36.8 C)  TempSrc:    Oral  SpO2: 94% 94% 92%   Weight:       CBC:  Recent Labs  Lab 01/18/22 1625 01/22/22 1558  WBC 8.2 12.0*  NEUTROABS 5.2 7.6  HGB 12.9 12.7  HCT 39.9 39.2  MCV 85.3 84.7  PLT 310 824   Basic Metabolic Panel:  Recent Labs  Lab 01/18/22 1625 01/22/22 1558  NA 137 137  K 3.9 3.4*  CL 103 101  CO2 27 26  GLUCOSE 98 82  BUN 13 19  CREATININE 1.19* 1.74*  CALCIUM 8.8* 9.4   Lipid Panel:  Recent Labs  Lab 01/23/22 0306  CHOL 127  TRIG 52  HDL 46  CHOLHDL 2.8  VLDL 10  LDLCALC 71   HgbA1c: No results for input(s): "HGBA1C" in the last 168 hours. Urine Drug Screen: No results for input(s): "LABOPIA", "COCAINSCRNUR", "LABBENZ", "AMPHETMU", "THCU", "LABBARB" in the last 168 hours.  Alcohol Level  Recent Labs  Lab 01/22/22 1558  ETH <10    IMAGING past 24 hours MR BRAIN WO CONTRAST  Result Date: 01/23/2022 CLINICAL DATA:  Stroke follow-up. EXAM: MRI HEAD WITHOUT CONTRAST TECHNIQUE: Multiplanar, multiecho pulse sequences of the brain and surrounding structures were obtained without intravenous contrast. COMPARISON:  Head CT and CTA from yesterday FINDINGS: Brain: No acute infarction, hemorrhage, hydrocephalus, extra-axial collection or mass lesion. Vascular: Normal flow voids. Skull  and upper cervical spine: Normal marrow signal. Sinuses/Orbits: Negative. Other: Nasopharyngeal retention cysts, incidental. IMPRESSION: Negative brain MRI.  No acute infarct. Electronically Signed   By: Jorje Guild M.D.   On: 01/23/2022 05:41   CT ANGIO HEAD NECK W WO CM (CODE STROKE)  Result Date: 01/22/2022 CLINICAL DATA:  Syncopal episode. Left-sided weakness. Possible right facial droop. EXAM: CT ANGIOGRAPHY HEAD AND NECK TECHNIQUE: Multidetector CT imaging of the head and neck was performed using the standard protocol during bolus administration of intravenous contrast. Multiplanar CT image reconstructions and MIPs were obtained to evaluate the vascular anatomy. Carotid stenosis measurements (when applicable) are obtained utilizing NASCET criteria, using the distal internal carotid diameter as the denominator. RADIATION DOSE REDUCTION: This exam was performed according to the departmental dose-optimization program which includes automated exposure control, adjustment of the mA and/or kV according to patient size and/or use of iterative reconstruction technique. CONTRAST:  25mL OMNIPAQUE IOHEXOL 350 MG/ML SOLN COMPARISON:  CT head without contrast 01/22/2022 FINDINGS: CTA NECK FINDINGS Aortic arch: A 3 vessel arch configuration is present. No focal vascular abnormality is present. Right carotid system: Right common carotid artery is within normal limits. Bifurcation is unremarkable. Cervical right ICA is normal. Left carotid system: Left common carotid artery is within normal limits. Motion artifact is present at the bifurcation. No significant stenosis is present. Cervical  left ICA is normal. Vertebral arteries: Left vertebral artery is the dominant vessel. Both vertebral arteries originate the subclavian arteries. No significant stenosis is present in either vertebral artery in the neck. Skeleton: Mild endplate degenerative changes are present at C5-6 and C6-7. No focal osseous lesions are present.  Other neck: Soft tissues the neck are otherwise unremarkable. Salivary glands are within normal limits. Thyroid is normal. No significant adenopathy is present. No focal mucosal or submucosal lesions are present. Upper chest: Lung apices are clear. Thoracic inlet is within limits. Review of the MIP images confirms the above findings CTA HEAD FINDINGS Anterior circulation: Internal carotid arteries are within normal limits from the skull base the ICA termini bilaterally. The A1 and M1 segments are normal. The anterior communicating artery is patent. ACA and MCA branch vessels are within normal limits bilaterally. Posterior circulation: Left vertebral artery is dominant. PICA origin is visualized and normal. Vertebrobasilar junction basilar artery is normal. Both posterior cerebral arteries originate from basilar tip. PCA branch vessels are normal bilaterally. Venous sinuses: The dural sinuses are patent. The straight sinus and deep cerebral veins are intact. Cortical veins are within normal limits. No significant vascular malformation is evident. Review of the MIP images confirms the above findings IMPRESSION: 1. Normal variant CTA Circle of Willis without significant proximal stenosis, aneurysm, or branch vessel occlusion. 2. Normal CTA of the neck.  No focal stenosis. 3. Mild endplate degenerative changes at C5-6 and C6-7. Electronically Signed   By: Marin Roberts M.D.   On: 01/22/2022 18:41   CT HEAD CODE STROKE WO CONTRAST`  Result Date: 01/22/2022 CLINICAL DATA:  Code stroke. Syncope/presyncope. Cerebrovascular cause suspected. Patient became dizzy passed out for 15-20 minutes. New onset left-sided weakness numbness. EXAM: CT HEAD WITHOUT CONTRAST TECHNIQUE: Contiguous axial images were obtained from the base of the skull through the vertex without intravenous contrast. RADIATION DOSE REDUCTION: This exam was performed according to the departmental dose-optimization program which includes automated  exposure control, adjustment of the mA and/or kV according to patient size and/or use of iterative reconstruction technique. COMPARISON:  None Available. FINDINGS: Brain: No acute infarct, hemorrhage, or mass lesion is present. No significant white matter lesions are present. The ventricles are of normal size. Deep brain nuclei are within normal limits. No significant extraaxial fluid collection is present. The brainstem and cerebellum are within normal limits. Vascular: No hyperdense vessel or unexpected calcification. Skull: Calvarium is intact. No focal lytic or blastic lesions are present. No significant extracranial soft tissue lesion is present. Sinuses/Orbits: The paranasal sinuses and mastoid air cells are clear. The globes and orbits are within normal limits. ASPECTS [facility] Stroke Program Early CT Score) - Ganglionic level infarction (caudate, lentiform nuclei, internal capsule, insula, M1-M3 cortex): 7/7 - Supraganglionic infarction (M4-M6 cortex): 3/3 Total score (0-10 with 10 being normal): 10/10 IMPRESSION: 1. Normal CT of the head. 2. Aspects is 10/10. These results were called by telephone at the time of interpretation on 01/22/2022 at 4:13 pm to provider [facility] , who verbally acknowledged these results. Electronically Signed   By: Marin Roberts M.D.   On: 01/22/2022 16:15    PHYSICAL EXAM General:  Alert, well-developed, well-nourished middle-age Caucasian obese lady patient in no acute distress Respiratory:  Regular, unlabored respirations on room air  NEURO:  Mental Status: AA&Ox3  Speech/Language: speech is without dysarthria or aphasia.  Fluency and comprehension intact.  Cranial Nerves:  II: PERRL.  III, IV, VI: EOMI. Eyelids elevate symmetrically.  V: Sensation is  intact to light touch and diminished on left with midline splitting to touch and vibration VII: Smile is symmetrical.  VIII: hearing intact to voice. IX, X: Phonation is normal.  XII: tongue is  midline without fasciculations. Motor: 5/5 strength to RUE and RLE, antigravity strength in LUE and LLE with very poor effort and positive Hoover sign Tone: is normal and bulk is normal Sensation- Intact to light touch bilaterally but diminished on the left hemibody including forehead with splitting of the midline for touch as well as vibration Gait- deferred   ASSESSMENT/PLAN Ms. Tracie Green is a 47 y.o. female with history of HTN, PE and recent abdominal pain presenting with an episode of dizziness and lightheadedness with loss of consciousness for about 20 minutes.  After patient regained consciousness, she had headache, left sided weakness and left sided numbness.  TNK was given.  Patient continues to have left sided numbness with splitting at the midline and positive Hoover sign.  MRI is negative for acute stroke.  Today, she complains of mid and lower back pain with shooting pain down the left leg when it is raised off the bed.  Will obtain thoracic and lumbar spine MRI.  Strokelike episode with left sided weakness and numbness and nonorganic features treated with IV TNK.    Code Stroke CT head No acute abnormality. ASPECTS 10.    CTA head & neck No LVO or hemodynamically significant stenosis MRI  No acute infarct 2D Echo pending LDL 71 HgbA1c 5.1 VTE prophylaxis - SCDs    Diet   Diet regular Room service appropriate? Yes; Fluid consistency: Thin   No antithrombotic prior to admission, now on No antithrombotic as she is < 24 hours from TNK Therapy recommendations:  CIR Disposition:  pending  Hypertension Home meds:  none Stable Keep BP <180/105 Long-term BP goal normotensive  Hyperlipidemia Home meds:  none LDL 71, goal < 70 Add atorvastatin 40 mg daily  Continue statin at discharge  Mid and low back pain Patient c/o left sided back pain radiating around her abdomen with shooting pain down the left leg when it is raised off the bed Will obtain thoracic and lumbar spine  MRI  Other Stroke Risk Factors Obesity, Body mass index is 55.23 kg/m., BMI >/= 30 associated with increased stroke risk, recommend weight loss, diet and exercise as appropriate   Other Active Problems none  Hospital day # 1  Cortney E Ernestina Columbia , MSN, AGACNP-BC [facility] See Amion for schedule and pager information 01/23/2022 12:37 PM   STROKE MD NOTE :  I have personally obtained history,examined this patient, reviewed notes, independently viewed imaging studies, participated in medical decision making and plan of care.ROS completed by me personally and pertinent positives fully documented  I have made any additions or clarifications directly to the above note. Agree with note above.  Patient presented with episode of dizziness and passing out for 20 minutes at church yesterday followed by left-sided numbness and weakness with nonorganic pattern was treated with IV TNK and seems somewhat improved with as new complaints of back pain and leg pain today.  Recommend continue close neurological observation and strict blood pressure control as per post TNK protocol.  Mobilize out of bed physical Occupational Therapy consults.  Check MRI scan of the thoracic and lumbar spine as patient has presented with similar back and abdominal pain 2 weeks ago to the ER work-up has been negative.This patient is critically ill and at significant risk of neurological  worsening, death and care requires constant monitoring of vital signs, hemodynamics,respiratory and cardiac monitoring, extensive review of multiple databases, frequent neurological assessment, discussion with family, other specialists and medical decision making of high complexity.I have made any additions or clarifications directly to the above note.This critical care time does not reflect procedure time, or teaching time or supervisory time of PA/NP/Med Resident etc but could involve care discussion time.  I spent 30 minutes of  neurocritical care time  in the care of  this patient.      Delia Heady, MD Medical Director [facility] Pager: [phone] 01/23/2022 4:10 PM  To contact Stroke Continuity provider, please refer to [email or web address]. After hours, contact General Neurology

## 2022-01-23 NOTE — Progress Notes (Signed)
Inpatient Rehab Admissions Coordinator:   Per therapy recommendations,  patient was screened for CIR candidacy by Clemens Catholic, MS, CCC-SLP.  Pt.'s insurance is out of network with CIR. If AIR level of care is desired, TOC will need to find in network facility.    Clemens Catholic, [facility] [place] [place] Admissions Coordinator  [phone] ([place]) [phone] (office)

## 2022-01-23 NOTE — Evaluation (Signed)
Physical Therapy Evaluation Patient Details Name: Tracie Green MRN: [id number] DOB: Mar 21, 1976 Today's Date: 01/23/2022  History of Present Illness  Tracie Green is an 46 y.o. female with a PMHx of HTN, PE, allergic rhinitis, Candida intertrigo and recent abdominal pain. She has been transferred to [facility] following TNK administration at the [facility] ED on Sunday afternoon for presumed stroke with L side weakness and syncope. Negative brain MRI. Positive for UTI.  Plans for spinal MRI.  Clinical Impression  Patient presents with decreased mobility due to L side weakness, back and L side pain, decreased balance and decreased activity tolerance.  She will benefit from skilled PT in the acute setting to allow return home with family support after acute inpatient rehab stay.         Recommendations for follow up therapy are one component of a multi-disciplinary discharge planning process, led by the attending physician.  Recommendations may be updated based on patient status, additional functional criteria and insurance authorization.  Follow Up Recommendations Acute inpatient rehab (3hours/day)      Assistance Recommended at Discharge Intermittent Supervision/Assistance  Patient can return home with the following  A little help with walking and/or transfers;A little help with bathing/dressing/bathroom;Assist for transportation;Direct supervision/assist for medications management;Assistance with cooking/housework    Equipment Recommendations Rolling walker (2 wheels)  Recommendations for Other Services       Functional Status Assessment Patient has had a recent decline in their functional status and demonstrates the ability to make significant improvements in function in a reasonable and predictable amount of time.     Precautions / Restrictions Precautions Precautions: Fall Precaution Comments: L side weakness      Mobility  Bed Mobility Overal bed mobility: Needs Assistance Bed Mobility:  Rolling, Sidelying to Sit, Sit to Sidelying Rolling: Mod assist Sidelying to sit: Mod assist     Sit to sidelying: Mod assist, +2 for physical assistance General bed mobility comments: cues for technique, used rail to roll, side to sit assist for trunk, to side, assist for trunk and legs with RN in the room    Transfers Overall transfer level: Needs assistance Equipment used: Rolling walker (2 wheels) Transfers: Sit to/from Stand Sit to Stand: From elevated surface, Mod assist           General transfer comment: unable to stand with heavy mod A from regular height bed, stood from elevated bed to RW with some lifting help    Ambulation/Gait Ambulation/Gait assistance: Min assist, Mod assist Gait Distance (Feet): 2 Feet Assistive device: Rolling walker (2 wheels) Gait Pattern/deviations: Step-to pattern, Decreased dorsiflexion - left, Shuffle       General Gait Details: side steps to [facility] with RW and mod A for managing RW and for safety with L foot on outside of foot  Stairs            Wheelchair Mobility    Modified Rankin (Stroke Patients Only) Modified Rankin (Stroke Patients Only) Pre-Morbid Rankin Score: No symptoms Modified Rankin: Moderately severe disability     Balance Overall balance assessment: Needs assistance   Sitting balance-Leahy Scale: Poor Sitting balance - Comments: holding onto footboard while sitting on EOB   Standing balance support: Bilateral upper extremity supported Standing balance-Leahy Scale: Poor Standing balance comment: UE support with A for balance                             Pertinent Vitals/Pain Pain Assessment Pain Assessment: 0-10 Pain  Score: 9  Pain Location: back and L side Pain Descriptors / Indicators: Grimacing, Guarding, Tingling, Shooting, Sharp Pain Intervention(s): Monitored during session, Repositioned, RN gave pain meds during session    Home Living Family/patient expects to be discharged to::  Private residence Living Arrangements: Alone Available Help at Discharge: Family;Available PRN/intermittently Type of Home: Apartment (converted motel with efficiency) Home Access: Level entry       Home Layout: One level Home Equipment: None 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 Level of Function : Independent/Modified Nurse, learning disability Dominance   Dominant Hand: Right    Extremity/Trunk Assessment   Upper Extremity Assessment Upper Extremity Assessment: Defer to OT evaluation    Lower Extremity Assessment Lower Extremity Assessment: LLE deficits/detail LLE Deficits / Details: AAROM limited by pain with knee flexion to about 45, strength hip flexion 2/5, knee extension 3-/5, ankle DF2/5 LLE Sensation: decreased light touch (tingling and painful) LLE Coordination: decreased gross motor       Communication   Communication: No difficulties  Cognition Arousal/Alertness: Awake/alert Behavior During Therapy: WFL for tasks assessed/performed Overall Cognitive Status: Within Functional Limits for tasks assessed                                          General Comments General comments (skin integrity, edema, etc.): Washed her back and pt tender to touch along L side radiating from spine about L2 area    Exercises General Exercises - Upper Extremity Shoulder Flexion: AAROM, Left, 5 reps, Supine Elbow Flexion: AAROM, Left, 5 reps, Supine General Exercises - Lower Extremity Ankle Circles/Pumps: AAROM, Left, 5 reps, Supine Heel Slides: AAROM, Left, 5 reps, Supine   Assessment/Plan    PT Assessment Patient needs continued PT services  PT Problem List Decreased strength;Decreased mobility;Decreased balance;Pain;Decreased knowledge of use of DME;Impaired sensation;Decreased activity tolerance;Decreased knowledge of precautions;Decreased range of motion       PT Treatment Interventions DME  instruction;Therapeutic activities;Therapeutic exercise;Gait training;Patient/family education;Balance training;Stair training;Functional mobility training    PT Goals (Current goals can be found in the Care Plan section)  Acute Rehab PT Goals Patient Stated Goal: To get back to work PT Goal Formulation: With patient Time For Goal Achievement: 02/06/22 Potential to Achieve Goals: Good    Frequency Min 3X/week     Co-evaluation               AM-PAC PT "6 Clicks" Mobility  Outcome Measure Help needed turning from your back to your side while in a flat bed without using bedrails?: A Lot Help needed moving from lying on your back to sitting on the side of a flat bed without using bedrails?: A Lot Help needed moving to and from a bed to a chair (including a wheelchair)?: Total Help needed standing up from a chair using your arms (e.g., wheelchair or bedside chair)?: A Lot Help needed to walk in hospital room?: Total Help needed climbing 3-5 steps with a railing? : Total 6 Click Score: 9    End of Session Equipment Utilized During Treatment: Gait belt Activity Tolerance: Patient limited by pain Patient left: in bed;Other (comment) (NS notified no call bell in the room) Nurse Communication: Mobility status PT Visit Diagnosis: Other abnormalities of gait and mobility (R26.89);Other symptoms and  signs involving the nervous system (R29.898);Pain Pain - Right/Left: Left Pain - part of body: Leg;Arm    Time: 4098-1191 PT Time Calculation (min) (ACUTE ONLY): 38 min   Charges:   PT Evaluation $PT Eval Moderate Complexity: 1 Mod PT Treatments $Therapeutic Activity: 8-22 mins        Magda Kiel, PT Acute Rehabilitation Services Office:[phone] 01/23/2022   [name] 01/23/2022, 11:39 AM

## 2022-01-23 NOTE — Plan of Care (Signed)
  Problem: Education: Goal: Knowledge of disease or condition will improve Outcome: Progressing Goal: Knowledge of secondary prevention will improve (MUST DOCUMENT ALL) Outcome: Progressing Goal: Knowledge of patient specific risk factors will improve [place] N/A or DELETE if not current risk factor) Outcome: Progressing   Problem: Health Behavior/Discharge Planning: Goal: Ability to manage health-related needs will improve Outcome: Progressing

## 2022-01-23 NOTE — Evaluation (Signed)
Speech Language Pathology Evaluation Patient Details Name: Tracie Green MRN: [id number] DOB: 12-28-1975 Today's Date: 01/23/2022 Time: 1011-1029 SLP Time Calculation (min) (ACUTE ONLY): 18 min  Problem List:  Patient Active Problem List   Diagnosis Date Noted   CVA (cerebral vascular accident) ([place]) 01/22/2022   Stroke (cerebrum) ([place]) 01/22/2022   Abdominal pain 12/25/2021   Allergic rhinitis 12/25/2021   Candidal intertrigo 12/25/2021   Vaginal discharge 12/25/2021   Acute cystitis without hematuria 01/03/2020   Essential hypertension 01/03/2020   Right sided abdominal pain 01/03/2020   Fibula fracture 01/29/2014   Past Medical History:  Past Medical History:  Diagnosis Date   Hypertension    PE (pulmonary embolism)    Past Surgical History:  Past Surgical History:  Procedure Laterality Date   CESAREAN SECTION     CHOLECYSTECTOMY  1998   INCISION AND DRAINAGE     HPI:  Tracie Green is an 46 y.o. female with a PMHx of HTN, PE, allergic rhinitis, Candida intertrigo and recent abdominal pain. She has been transferred to [facility] following TNK administration at the [facility] ED on Sunday afternoon for presumed stroke. Negative brain MRI.  No acute infarct.   Assessment / Plan / Recommendation Clinical Impression  Pt seen for cognitive linguistic evaluation after presenting to ED with complaints of stroke like symptoms. Pt cooperative but intermittently lethargic throughout session marked by low vocal intensity and reduced effort in completing tasks. The patient is oriented to self, day and month. An assessment of oral motor function showed no concerns for focal weakness, however, pt completed all oral motor tasks with decreased effort. [facility] Mental Status assessment was administered to further assess cognitive linguistic functioning. The pt received a score of 12/30, indicating the presence of a deficit. Deficits in memory, attention, problem solving, and divergent naming  noted. The pt had difficulty with item recall, simple mental math, and recall of story items. Increased processing time and semantic cues were effective in aiding with retrieval. Verbal comprehension appears within functional limit for the tasks assessed. Pt complains of increased pain which could have effected the results of the assessment. Pt would benefit from ongoing differential diagnosis of cognitive abilities v. baseline functioning. SLP will follow.    SLP Assessment  SLP Visit Diagnosis: Cognitive communication deficit (R41.841)    Recommendations for follow up therapy are one component of a multi-disciplinary discharge planning process, led by the attending physician.  Recommendations may be updated based on patient status, additional functional criteria and insurance authorization.    Follow Up Recommendations  Follow physician's recommendations for discharge plan and follow up therapies    Assistance Recommended at Discharge  PRN  Functional Status Assessment Patient has had a recent decline in their functional status and demonstrates the ability to make significant improvements in function in a reasonable and predictable amount of time.  Frequency and Duration min 1 x/week  2 weeks      SLP Evaluation Cognition  Overall Cognitive Status: Within Functional Limits for tasks assessed Arousal/Alertness: Awake/alert Orientation Level: Oriented to person Year: 2023 Day of Week: Correct Attention: Selective Selective Attention: Impaired Selective Attention Impairment: Verbal basic Memory: Impaired Memory Impairment: Decreased recall of new information;Decreased short term memory Decreased Short Term Memory: Verbal basic Awareness: Appears intact Problem Solving: Impaired Problem Solving Impairment: Verbal basic       Comprehension  Auditory Comprehension Overall Auditory Comprehension: Appears within functional limits for tasks assessed Yes/No Questions: Not  tested Commands: Within Functional Limits Conversation:  Simple Interfering Components: Pain EffectiveTechniques: Extra processing time;Repetition Visual Recognition/Discrimination Discrimination: Not tested Reading Comprehension Reading Status: Not tested    Expression Expression Primary Mode of Expression: Verbal Verbal Expression Overall Verbal Expression: Appears within functional limits for tasks assessed Initiation: No impairment Level of Generative/Spontaneous Verbalization: Sentence Repetition: No impairment Naming: No impairment Pragmatics: Unable to assess Effective Techniques: Semantic cues Non-Verbal Means of Communication: Not applicable Written Expression Dominant Hand: Right Written Expression: Not tested   Oral / Motor  Oral Motor/Sensory Function Overall Oral Motor/Sensory Function: Within functional limits Motor Speech Overall Motor Speech: Appears within functional limits for tasks assessed Respiration: Within functional limits Phonation: Low vocal intensity Resonance: Within functional limits Articulation: Within functional limitis Intelligibility: Intelligible Motor Planning: Not tested Motor Speech Errors: Not applicable            Gearlean Alf Student SLP  01/23/2022, 12:51 PM

## 2022-01-23 NOTE — Progress Notes (Signed)
Pt failed swallow eval at bedside x2 following MD request.

## 2022-01-23 NOTE — Progress Notes (Signed)
Pt's BP 91/67/ MAP 77, w/o neuro change. Dr. Cheral Marker informed.

## 2022-01-23 NOTE — Evaluation (Addendum)
Clinical/Bedside Swallow Evaluation Patient Details  Name: Tracie Green MRN: [id number] Date of Birth: 05/27/1975  Today's Date: 01/23/2022 Time: SLP Start Time (ACUTE ONLY): 1000 SLP Stop Time (ACUTE ONLY): 1011 SLP Time Calculation (min) (ACUTE ONLY): 11 min  Past Medical History:  Past Medical History:  Diagnosis Date   Hypertension    PE (pulmonary embolism)    Past Surgical History:  Past Surgical History:  Procedure Laterality Date   CESAREAN SECTION     CHOLECYSTECTOMY  1998   INCISION AND DRAINAGE     HPI:  Tracie Green is an 46 y.o. female with a PMHx of HTN, PE, allergic rhinitis, Candida intertrigo and recent abdominal pain. She has been transferred to [facility] following TNK administration at the [facility] ED on Sunday afternoon for presumed stroke. Negative brain MRI.  No acute infarct.    Assessment / Plan / Recommendation  Clinical Impression  Pt demonstrates no significant difficulty swallowing. She is alert and upright, speech is clear and fluent. In oral motor activity pt had decreased effort, but no weakness. Pt takes small careful bites and sips, reports sensory changes on the left side of her mouth and throat. She is able to tolerate textures without coughing. Suspect she will become more confident eating and drinking with time. Will advance her to regular solids and thin liquids. Discussed basic aspiration precautions with her, particularly following solids with liquids if she feeds food sticking. Will sign off for swallowing. SLP Visit Diagnosis: Dysphagia, unspecified (R13.10)    Aspiration Risk  Mild aspiration risk    Diet Recommendation Regular;Thin liquid   Liquid Administration via: Cup;Straw Medication Administration: Whole meds with liquid Supervision: Patient able to self feed    Other  Recommendations      Recommendations for follow up therapy are one component of a multi-disciplinary discharge planning process, led by the attending physician.   Recommendations may be updated based on patient status, additional functional criteria and insurance authorization.  Follow up Recommendations No SLP follow up        Swallow Study   General HPI: Tracie Green is an 46 y.o. female with a PMHx of HTN, PE, allergic rhinitis, Candida intertrigo and recent abdominal pain. She has been transferred to [facility] following TNK administration at the [facility] ED on Sunday afternoon for presumed stroke. Negative brain MRI.  No acute infarct. Type of Study: Bedside Swallow Evaluation Previous Swallow Assessment: none Diet Prior to this Study: NPO Temperature Spikes Noted: No Respiratory Status: Room air History of Recent Intubation: No Behavior/Cognition: Alert;Cooperative;Pleasant mood Oral Cavity Assessment: Within Functional Limits Oral Care Completed by SLP: No Oral Cavity - Dentition: Adequate natural dentition Vision: Functional for self-feeding Self-Feeding Abilities: Able to feed self Patient Positioning: Upright in bed Baseline Vocal Quality: Normal Volitional Cough: Strong Volitional Swallow: Able to elicit    Oral/Motor/Sensory Function Overall Oral Motor/Sensory Function: decreased effort with some movements  Ice Chips     Thin Liquid Thin Liquid: Within functional limits    Nectar Thick Nectar Thick Liquid: Not tested   Honey Thick Honey Thick Liquid: Not tested   Puree Puree: Within functional limits   Solid     Solid: Within functional limits Other Comments:  (pt complains of sensory changes)      Tanai Bouler, Katherene Ponto 01/23/2022,10:12 AM

## 2022-01-23 NOTE — Progress Notes (Signed)
OT Cancellation Note  Patient Details Name: Tracie Green MRN: [id number] DOB: 18-Jul-1975   Cancelled Treatment:    Reason Eval/Treat Not Completed: Active bedrest order OT order received and appreciated however this conflicts with current bedrest order set. Please increase activity tolerance as appropriate and remove bedrest from orders. . Please contact OT at [phone] if bed rest order is discontinued. OT will hold evaluation at this time and will check back as time allows pending increased activity orders.   Jeri Modena 01/23/2022, 7:39 AM

## 2022-01-23 NOTE — Telephone Encounter (Signed)
Unable to do TOC call, pt scheduled on 01/27/2022

## 2022-01-24 ENCOUNTER — Inpatient Hospital Stay (HOSPITAL_COMMUNITY): Payer: BLUE CROSS/BLUE SHIELD

## 2022-01-24 DIAGNOSIS — I6389 Other cerebral infarction: Secondary | ICD-10-CM | POA: Diagnosis not present

## 2022-01-24 LAB — BASIC METABOLIC PANEL
Anion gap: 8 (ref 5–15)
BUN: 12 mg/dL (ref 6–20)
CO2: 24 mmol/L (ref 22–32)
Calcium: 8.9 mg/dL (ref 8.9–10.3)
Chloride: 103 mmol/L (ref 98–111)
Creatinine, Ser: 1.25 mg/dL — ABNORMAL HIGH (ref 0.44–1.00)
GFR, Estimated: 54 mL/min — ABNORMAL LOW (ref >60)
Glucose, Bld: 91 mg/dL (ref 70–99)
Potassium: 4 mmol/L (ref 3.5–5.1)
Sodium: 135 mmol/L (ref 135–145)

## 2022-01-24 MED ORDER — TRAMADOL HCL 50 MG PO TABS
50.0000 mg | ORAL_TABLET | Freq: Four times a day (QID) | ORAL | Status: DC | PRN
  Administered 2022-01-24 – 2022-01-25 (×3): 50 mg via ORAL
  Filled 2022-01-24 (×3): qty 1

## 2022-01-24 MED ORDER — HYDROXYZINE HCL 25 MG PO TABS
25.0000 mg | ORAL_TABLET | Freq: Four times a day (QID) | ORAL | Status: DC | PRN
  Administered 2022-01-24 – 2022-01-25 (×2): 25 mg via ORAL
  Filled 2022-01-24 (×2): qty 1

## 2022-01-24 MED ORDER — TOPIRAMATE 25 MG PO TABS
50.0000 mg | ORAL_TABLET | Freq: Two times a day (BID) | ORAL | Status: DC
  Administered 2022-01-24 (×2): 50 mg via ORAL
  Filled 2022-01-24 (×2): qty 2

## 2022-01-24 NOTE — Evaluation (Addendum)
Occupational Therapy Evaluation Patient Details Name: Tracie Green MRN: [id number] DOB: 1975-07-11 Today's Date: 01/24/2022   History of Present Illness 46 y.o. female with a PMHx of HTN, PE, allergic rhinitis, Candida intertrigo and recent abdominal pain. She has been transferred to [facility] following TNK administration at the [facility] ED on Sunday afternoon for presumed stroke with L side weakness and syncope. Negative brain MRI. Positive for UTI.     Clinical Impression   PT admitted with L side weakness with negative MRI for head and back at this time. . Pt currently with functional limitiations due to the deficits listed below (see OT problem list). Pt at baseline indep and working. Pt has a dog that she cares for at baseline. Pt currently reports 9 out 10 pain and advanced 3-4 steps with RW during session due to pain. Pt with dizziness and noted to be orthostatic with transfer attempt. Recommend stand pivot only with RN staff at this time.  Pt will benefit from skilled OT to increase their independence and safety with adls and balance to allow discharge CIR.  Pt lives alone and will need highest level of care possible prior to d/c home.        Recommendations for follow up therapy are one component of a multi-disciplinary discharge planning process, led by the attending physician.  Recommendations may be updated based on patient status, additional functional criteria and insurance authorization.   Follow Up Recommendations  Acute inpatient rehab (3hours/day)    Assistance Recommended at Discharge Intermittent Supervision/Assistance  Patient can return home with the following Two people to help with walking and/or transfers;Two people to help with bathing/dressing/bathroom;Assist for transportation    Functional Status Assessment  Patient has had a recent decline in their functional status and demonstrates the ability to make significant improvements in function in a reasonable and predictable  amount of time.  Equipment Recommendations  BSC/3in1;Wheelchair (measurements OT);Wheelchair cushion (measurements OT)    Recommendations for Other Services Rehab consult     Precautions / Restrictions Precautions Precautions: Fall Precaution Comments: L side weakness Restrictions Weight Bearing Restrictions: No      Mobility Bed Mobility Overal bed mobility: Needs Assistance Bed Mobility: Rolling, Sidelying to Sit, Sit to Sidelying Rolling: Min assist Sidelying to sit: Mod assist, Min assist       General bed mobility comments: pt rolling toward L side and heavy use of bed rail to pull toward L side. pt then using core to attempt to sit up with mod (A) from therapist pt pulling on therapist for (A). pt initiated L UE with transfer with positioning of L UE    Transfers Overall transfer level: Needs assistance Equipment used: Rolling walker (2 wheels) Transfers: Sit to/from Stand Sit to Stand: +2 physical assistance, Min assist           General transfer comment: pt completed sit<>Stand x3 this session with normal surface height. pt with mod cues to push up with R UE and L UE on RW. pt able to sustain L UE on RW without (A). pt at one point in standing resting L UE and using R UE only on RW      Balance Overall balance assessment: Needs assistance Sitting-balance support: No upper extremity supported, Feet supported Sitting balance-Leahy Scale: Poor Sitting balance - Comments: holding onto footboard while sitting on EOB   Standing balance support: Bilateral upper extremity supported Standing balance-Leahy Scale: Poor Standing balance comment: UE support with A for balance  ADL either performed or assessed with clinical judgement   ADL Overall ADL's : Needs assistance/impaired Eating/Feeding: Set up;Sitting Eating/Feeding Details (indicate cue type and reason): opening containers Grooming: Wash/dry face;Min guard;Sitting    Upper Body Bathing: Minimal assistance;Sitting   Lower Body Bathing: Moderate assistance;Sit to/from stand           Toilet Transfer: +2 for physical assistance;Minimal assistance;Rolling walker (2 wheels)             General ADL Comments: pt demonstrates decreased BP with transfers and dizziness. pt required return to sitting and on second attempt able to get a better BP reading for SBP 20 point change     Vision Baseline Vision/History: 0 No visual deficits Vision Assessment?: No apparent visual deficits     Perception     Praxis      Pertinent Vitals/Pain Pain Assessment Pain Assessment: 0-10 Pain Score: 9  Pain Location: back and L side Pain Descriptors / Indicators: Grimacing, Guarding, Tingling, Shooting, Sharp Pain Intervention(s): Monitored during session, Premedicated before session, Repositioned     Hand Dominance Right   Extremity/Trunk Assessment Upper Extremity Assessment Upper Extremity Assessment: LUE deficits/detail LUE Deficits / Details: pt with decreased use to push of bed surface but able to use on RW to hold LLE off the floor. pt able to complete shoulder flexion to place on pillow. LUE weaker compared to R UE   Lower Extremity Assessment Lower Extremity Assessment: Defer to PT evaluation LLE Sensation:  (tingling and painful)   Cervical / Trunk Assessment Cervical / Trunk Assessment: Other exceptions (reports back pain)   Communication Communication Communication: No difficulties   Cognition Arousal/Alertness: Awake/alert Behavior During Therapy: WFL for tasks assessed/performed Overall Cognitive Status: Within Functional Limits for tasks assessed                                       General Comments  SBP decrease during session with pt symptomatic dizziness    Exercises     Shoulder Instructions      Home Living Family/patient expects to be discharged to:: Private residence Living Arrangements:  Alone Available Help at Discharge: Family;Available PRN/intermittently Type of Home: Apartment (converted motel with efficiency) Home Access: Level entry     Home Layout: One level     Bathroom Shower/Tub: Teacher, early years/pre: Standard     Home Equipment: None   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      Prior Functioning/Environment Prior Level of Function : Independent/Modified Independent                        OT Problem List: Decreased strength;Decreased activity tolerance;Impaired balance (sitting and/or standing);Decreased safety awareness;Decreased knowledge of precautions;Decreased knowledge of use of DME or AE;Cardiopulmonary status limiting activity;Obesity;Impaired UE functional use;Pain      OT Treatment/Interventions: Self-care/ADL training;Therapeutic exercise;Neuromuscular education;Energy conservation;DME and/or AE instruction;Manual therapy;Modalities;Therapeutic activities;Patient/family education;Balance training    OT Goals(Current goals can be found in the care plan section) Acute Rehab OT Goals Patient Stated Goal: none stated at this time OT Goal Formulation: With patient Time For Goal Achievement: 02/07/22 Potential to Achieve Goals: Good  OT Frequency: Min 2X/week    Co-evaluation PT/OT/SLP Co-Evaluation/Treatment: Yes Reason for Co-Treatment: For patient/therapist safety;To address functional/ADL transfers   OT goals addressed during session: ADL's and self-care;Strengthening/ROM      AM-PAC OT "6 Clicks" Daily  Activity     Outcome Measure Help from another person eating meals?: A Little Help from another person taking care of personal grooming?: A Little Help from another person toileting, which includes using toliet, bedpan, or urinal?: A Lot Help from another person bathing (including washing, rinsing, drying)?: A Lot Help from another person to put on and taking off regular upper body clothing?: A Little Help from  another person to put on and taking off regular lower body clothing?: A Lot 6 Click Score: 15   End of Session Equipment Utilized During Treatment: Rolling walker (2 wheels);Gait belt Nurse Communication: Mobility status;Precautions  Activity Tolerance: Patient tolerated treatment well Patient left: in chair;with call bell/phone within reach;with chair alarm set  OT Visit Diagnosis: Unsteadiness on feet (R26.81);Muscle weakness (generalized) (M62.81)                Time: 2951-8841 OT Time Calculation (min): 33 min Charges:  OT General Charges $OT Visit: 1 Visit OT Evaluation $OT Eval Moderate Complexity: 1 Mod   Brynn, OTR/L  Acute Rehabilitation Services Office: [phone] .   Mateo Flow 01/24/2022, 12:57 PM

## 2022-01-24 NOTE — Progress Notes (Signed)
  Echocardiogram 2D Echocardiogram attempted at 0950. Study paused for pt care. Will re-attempt as schedule permits.  Larene Beach O'Grady 01/24/2022, 10:00 AM

## 2022-01-24 NOTE — Discharge Summary (Shared)
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 Active Problems:   Stroke-like episode s/p TNK   Back pain Leg and arm pain Left hemisensory loss   Medications to be continued on Rehab Allergies as of 01/26/2022       Reactions   Penicillins Hives, Rash   Hives   Morphine Nausea And Vomiting   Morphine And Related Nausea And Vomiting        Medication List     STOP taking these medications    clotrimazole 1 % cream Commonly known as: Antifungal Clotrimazole   levofloxacin 500 MG tablet Commonly known as: LEVAQUIN   sulfamethoxazole-trimethoprim 800-160 MG tablet Commonly known as: BACTRIM DS       TAKE these medications    atorvastatin 40 MG tablet Commonly known as: LIPITOR Take 1 tablet (40 mg total) by mouth daily. Start taking on: January 27, 2022   dicyclomine 20 MG tablet Commonly known as: BENTYL Take 1 tablet (20 mg total) by mouth 3 (three) times daily as needed for up to 21 doses for spasms.   EXCEDRIN PO Take 1 tablet by mouth as needed (migraine).   famotidine 20 MG tablet Commonly known as: PEPCID Take 1 tablet (20 mg total) by mouth daily for 30 doses.   fluticasone 50 MCG/ACT nasal spray Commonly known as: FLONASE Place 2 sprays into both nostrils daily. What changed:  when to take this reasons to take this   levocetirizine 5 MG tablet Commonly known as: XYZAL Take 1 tablet (5 mg total) by mouth every evening. What changed:  when to take this reasons to take this   lisinopril-hydrochlorothiazide 20-25 MG tablet Commonly known as: ZESTORETIC Take 1 tablet by mouth daily.    methylPREDNISolone 4 MG Tbpk tablet Commonly known as: MEDROL DOSEPAK Elmer Picker FNP- [facility]   methylPREDNISolone 4 MG Tbpk tablet Commonly known as: MEDROL DOSEPAK Elmer Picker FNP- BC   methylPREDNISolone 4 MG Tbpk tablet Commonly known as: MEDROL DOSEPAK Elmer Picker FNP- [facility] Start taking on: January 27, 2022   ondansetron 4 MG disintegrating tablet Commonly known as: ZOFRAN-ODT Take 1 tablet (4 mg total) by mouth every 8 (eight) hours as needed for up to 15 doses for nausea or vomiting.   pantoprazole 20 MG tablet Commonly known as: Protonix Take 1 tablet (20 mg total) by mouth daily.   PRESCRIPTION MEDICATION darvocet   PROBIOTIC PRODUCT PO Take 1 capsule by mouth daily.   tiZANidine 2 MG tablet Commonly known as: ZANAFLEX Take 1 tablet (2 mg total) by mouth 2 (two) times daily.   topiramate 100 MG tablet Commonly known as: TOPAMAX Take 1 tablet (100 mg total) by mouth 2 (two) times daily.               Durable Medical Equipment  (From admission, onward)           Start     Ordered   01/26/22 1359  For home use only DME 4 wheeled rolling walker with seat  Once       Comments: Bariatric size  Question:  Patient needs a walker to treat with the following condition  Answer:  Weakness   01/26/22 1358   01/26/22 1359  For home use only DME Bedside commode  Once       Comments: Bariatric size  Question:  Patient needs a bedside commode to treat with the following condition  Answer:  Weakness   01/26/22 1359   01/26/22 1357  For home use only DME high strength lightweight manual wheelchair with seat cushion  Once       Comments: Patient suffers from weakness which impairs their ability to perform daily activities like bathing in the home.  A walker will not resolve  issue with performing activities of daily living. A wheelchair will allow patient to safely perform daily activities.Length of need 6 months . (THEN ONE OF THESE TWO:) Patient requires a size of  22inch  which is not available in a standard or lightweight wheelchair and patient spends at least two hours per day in their chair. Accessories: elevating leg rests (ELRs), wheel locks, extensions and anti-tippers.   01/26/22 1358            LABORATORY STUDIES CBC    Component Value Date/Time   WBC 12.0 (H) 01/22/2022 1558   RBC 4.63 01/22/2022 1558   HGB 12.7 01/22/2022 1558   HGB 12.9 12/26/2021 1353   HCT 39.2 01/22/2022 1558   HCT 39.5 12/26/2021 1353   PLT 324 01/22/2022 1558   PLT 338 12/26/2021 1353   MCV 84.7 01/22/2022 1558   MCV 84 12/26/2021 1353   MCH 27.4 01/22/2022 1558   MCHC 32.4 01/22/2022 1558   RDW 13.9 01/22/2022 1558   RDW 13.6 12/26/2021 1353   LYMPHSABS 2.9 01/22/2022 1558   LYMPHSABS 2.1 12/26/2021 1353   MONOABS 0.9 01/22/2022 1558   EOSABS 0.5 01/22/2022 1558   EOSABS 0.4 12/26/2021 1353   BASOSABS 0.1 01/22/2022 1558   BASOSABS 0.1 12/26/2021 1353   CMP    Component Value Date/Time   NA 135 01/24/2022 0402   NA 138 12/26/2021 1353   K 4.0 01/24/2022 0402   CL 103 01/24/2022 0402   CO2 24 01/24/2022 0402   GLUCOSE 91 01/24/2022 0402   BUN 12 01/24/2022 0402   BUN 11 12/26/2021 1353   CREATININE 1.25 (H) 01/24/2022 0402   CALCIUM 8.9 01/24/2022 0402   PROT 7.5 01/22/2022 1558   PROT 6.9 12/26/2021 1353   ALBUMIN 4.0 01/22/2022 1558   ALBUMIN 4.0 12/26/2021 1353   AST 14 (L) 01/22/2022 1558   ALT 11 01/22/2022 1558   ALKPHOS 97 01/22/2022 1558   BILITOT 0.3 01/22/2022 1558   BILITOT 0.5 12/26/2021 1353   GFRNONAA 54 (L) 01/24/2022 0402   GFRAA 83 (L) 03/19/2013 1320   COAGS Lab Results  Component Value Date   INR 1.0 01/22/2022   Lipid Panel    Component Value Date/Time   CHOL 127 01/23/2022 0306   CHOL 151 12/26/2021 1353   TRIG 52 01/23/2022 0306   HDL 46 01/23/2022 0306   HDL 63 12/26/2021 1353   CHOLHDL 2.8 01/23/2022 0306   VLDL 10 01/23/2022 0306   LDLCALC 71 01/23/2022 0306   LDLCALC 73 12/26/2021 1353    HgbA1C  Lab Results  Component Value Date   HGBA1C 5.1 12/26/2021   Urinalysis    Component Value Date/Time   COLORURINE YELLOW 01/23/2022 1044   APPEARANCEUR CLOUDY (A) 01/23/2022 1044   LABSPEC 1.025 01/23/2022 1044   PHURINE 5.5 01/23/2022 1044   GLUCOSEU NEGATIVE 01/23/2022 1044   HGBUR NEGATIVE 01/23/2022 1044  BILIRUBINUR NEGATIVE 01/23/2022 1044   KETONESUR NEGATIVE 01/23/2022 1044   PROTEINUR NEGATIVE 01/23/2022 1044   UROBILINOGEN 0.2 03/19/2013 1647   NITRITE POSITIVE (A) 01/23/2022 1044   LEUKOCYTESUR SMALL (A) 01/23/2022 1044   Urine Drug Screen No results found for: "LABOPIA", "COCAINSCRNUR", "LABBENZ", "AMPHETMU", "THCU", "LABBARB"  Alcohol Level    Component Value Date/Time   ETH <10 01/22/2022 1558     SIGNIFICANT DIAGNOSTIC STUDIES ECHOCARDIOGRAM COMPLETE  Result Date: 01/25/2022    ECHOCARDIOGRAM REPORT   Patient Name:   ZORIA RAWLINSON Date of Exam: 01/25/2022 Medical Rec #:  [id number]   Height:       70.0 in Accession #:    [id number]  Weight:       384.9 lb Date of Birth:  1975-04-25  BSA:          2.757 m Patient Age:    46 years    BP:           118/74 mmHg Patient Gender: F           HR:           75 bpm. Exam Location:  Inpatient Procedure: 2D Echo Indications:    stroke  History:        Patient has no prior history of Echocardiogram examinations.                 Risk Factors:Hypertension.  Sonographer:    Delcie Roch RDCS Referring Phys: [phone] ERIC LINDZEN  Sonographer Comments: Patient is obese. Image acquisition challenging due to patient body habitus. IMPRESSIONS  1. Left ventricular ejection fraction, by estimation, is 50 to 55%. The left ventricle has low normal function. The left ventricle has no regional wall motion abnormalities. Left ventricular diastolic parameters were normal.  2. Right ventricular systolic function is normal. The right ventricular size is normal.  3. The mitral valve is normal in structure. Trivial mitral valve  regurgitation. No evidence of mitral stenosis.  4. The aortic valve is normal in structure. Aortic valve regurgitation is not visualized. No aortic stenosis is present.  5. The inferior vena cava is normal in size with greater than 50% respiratory variability, suggesting right atrial pressure of 3 mmHg. FINDINGS  Left Ventricle: Left ventricular ejection fraction, by estimation, is 50 to 55%. The left ventricle has low normal function. The left ventricle has no regional wall motion abnormalities. The left ventricular internal cavity size was normal in size. There is no left ventricular hypertrophy. Left ventricular diastolic parameters were normal. Normal left ventricular filling pressure. Right Ventricle: The right ventricular size is normal. No increase in right ventricular wall thickness. Right ventricular systolic function is normal. Left Atrium: Left atrial size was normal in size. Right Atrium: Right atrial size was normal in size. Pericardium: There is no evidence of pericardial effusion. Mitral Valve: The mitral valve is normal in structure. Trivial mitral valve regurgitation. No evidence of mitral valve stenosis. Tricuspid Valve: The tricuspid valve is normal in structure. Tricuspid valve regurgitation is not demonstrated. No evidence of tricuspid stenosis. Aortic Valve: The aortic valve is normal in structure. Aortic valve regurgitation is not visualized. No aortic stenosis is present. Pulmonic Valve: The pulmonic valve was normal in structure. Pulmonic valve regurgitation is not visualized. No evidence of pulmonic stenosis. Aorta: The aortic root is normal in size and structure. Venous: The inferior vena cava is normal in size with greater than 50% respiratory variability, suggesting right atrial pressure of 3 mmHg. IAS/Shunts: No atrial level shunt  detected by color flow Doppler.  LEFT VENTRICLE PLAX 2D LVIDd:         5.50 cm      Diastology LVIDs:         4.10 cm      LV e' medial:    6.31 cm/s LV PW:          1.10 cm      LV E/e' medial:  9.6 LV IVS:        1.10 cm      LV e' lateral:   14.10 cm/s LVOT diam:     2.00 cm      LV E/e' lateral: 4.3 LV SV:         59 LV SV Index:   21 LVOT Area:     3.14 cm  LV Volumes (MOD) LV vol d, MOD A4C: 125.0 ml LV vol s, MOD A4C: 71.1 ml LV SV MOD A4C:     125.0 ml RIGHT VENTRICLE             IVC RV S prime:     13.60 cm/s  IVC diam: 1.40 cm TAPSE (M-mode): 1.9 cm LEFT ATRIUM           Index        RIGHT ATRIUM           Index LA diam:      3.80 cm 1.38 cm/m   RA Area:     16.30 cm LA Vol (A2C): 44.9 ml 16.29 ml/m  RA Volume:   42.70 ml  15.49 ml/m LA Vol (A4C): 42.0 ml 15.23 ml/m  AORTIC VALVE LVOT Vmax:   96.80 cm/s LVOT Vmean:  62.700 cm/s LVOT VTI:    0.187 m  AORTA Ao Root diam: 3.10 cm Ao Asc diam:  3.50 cm MITRAL VALVE MV Area (PHT): 3.60 cm    SHUNTS MV Decel Time: 211 msec    Systemic VTI:  0.19 m MV E velocity: 60.60 cm/s  Systemic Diam: 2.00 cm MV A velocity: 63.20 cm/s MV E/A ratio:  0.96 Mihai Croitoru MD Electronically signed by Thurmon Fair MD Signature Date/Time: 01/25/2022/5:36:12 PM    Final    MR THORACIC SPINE WO CONTRAST  Result Date: 01/24/2022 CLINICAL DATA:  Headache and left-sided weakness/numbness and pain along left side of body. EXAM: MRI THORACIC AND LUMBAR SPINE WITHOUT CONTRAST TECHNIQUE: Multiplanar and multiecho pulse sequences of the thoracic and lumbar spine were obtained without intravenous contrast. COMPARISON:  CT abdomen/pelvis 01/18/2022, CT lumbar spine 06/07/2021, CT chest 01/25/2021, lumbar spine MRI 02/17/2014 FINDINGS: MRI THORACIC SPINE FINDINGS Alignment:  Normal. Vertebrae: Vertebral body heights are preserved. Background marrow signal is normal. There is mild degenerative endplate irregularity in the mid and lower thoracic spine. There is mild STIR signal abnormality in the posterior elements bilaterally at T7 and T8 (5-12, 5-5). There is no suspicious marrow signal abnormality or other marrow edema. Cord:  Normal  in signal and morphology. Paraspinal and other soft tissues: Unremarkable. Disc levels: There is overall mild degenerative endplate change and facet arthropathy in the thoracic spine. There are scattered shallow disc protrusions, most notable at T2-T3 where there is a small right foraminal/extraforaminal protrusion, but no significant spinal canal or neural foraminal stenosis. There is no evidence of cord or nerve root impingement. MRI LUMBAR SPINE FINDINGS Segmentation: Standard; the lowest formed disc space is designated L5-S1. Alignment: There is trace retrolisthesis of L1 on L2 and L2 on L3, similar to 2015. Vertebrae: Vertebral body  heights are preserved. Background marrow signal is normal. There is degenerative endplate marrow signal abnormality with edema along the L2-L3 disc space. There is no suspicious marrow signal abnormality or other marrow edema. Conus medullaris and cauda equina: Conus extends to the L2 level. Conus and cauda equina appear normal. Paraspinal and other soft tissues: There is 2.6 cm cystic lesion in the right pelvis which may be related to the right adnexa or bowel for which no specific imaging follow-up is required. The paraspinal soft tissues are unremarkable. Disc levels: There is moderate disc degeneration at L2-L3 and more mild disc degeneration elsewhere in the lumbar spine, progressed since 2015. T12-L1: Mild facet arthropathy without significant spinal canal or neural foraminal stenosis L1-L2: Trace retrolisthesis with a mild disc bulge eccentric to the right and mild facet arthropathy but no significant spinal canal or neural foraminal stenosis. L2-L3: There is trace retrolisthesis with a diffuse disc bulge, degenerative endplate change, and mild bilateral facet arthropathy resulting in mild spinal canal and left worse than right subarticular zone narrowing with possible irritation of the traversing left L3 nerve root, and mild bilateral neural foraminal stenosis. Findings at  this level are progressed since 2015 L3-L4: Mild facet arthropathy with trace effusions and a small posteriorly projecting left synovial cyst without significant spinal canal or neural foraminal stenosis L4-L5: Mild-to-moderate bilateral facet arthropathy with a small posteriorly projecting left synovial cyst and a minimal disc bulge resulting in mild left and no significant right neural foraminal stenosis and no significant spinal canal stenosis. L5-S1: Mild facet arthropathy without significant spinal canal or neural foraminal stenosis. IMPRESSION: 1. Mild edema in the bilateral posterior elements at T7 and T8 is favored degenerative/reactive in nature but could reflect a source of pain. 2. Otherwise, minimal degenerative change of the thoracic spine without significant spinal canal or neural foraminal stenosis, and no evidence of cord or nerve root impingement. 3. Degenerative changes in the lumbar spine most advanced at L2-L3 where there is moderate disc degeneration with associated degenerative endplate edema, a mild disc bulge, and facet arthropathy resulting in mild spinal canal and left worse than right subarticular zone narrowing with possible irritation of the traversing left L3 nerve root, and mild bilateral neural foraminal stenosis. Findings are progressed since 2015. 4. Overall mild degenerative changes elsewhere in the lumbar spine without significant spinal canal or neural foraminal stenosis as detailed above. Electronically Signed   By: Lesia Hausen M.D.   On: 01/24/2022 12:11   MR LUMBAR SPINE WO CONTRAST  Result Date: 01/24/2022 CLINICAL DATA:  Headache and left-sided weakness/numbness and pain along left side of body. EXAM: MRI THORACIC AND LUMBAR SPINE WITHOUT CONTRAST TECHNIQUE: Multiplanar and multiecho pulse sequences of the thoracic and lumbar spine were obtained without intravenous contrast. COMPARISON:  CT abdomen/pelvis 01/18/2022, CT lumbar spine 06/07/2021, CT chest 01/25/2021,  lumbar spine MRI 02/17/2014 FINDINGS: MRI THORACIC SPINE FINDINGS Alignment:  Normal. Vertebrae: Vertebral body heights are preserved. Background marrow signal is normal. There is mild degenerative endplate irregularity in the mid and lower thoracic spine. There is mild STIR signal abnormality in the posterior elements bilaterally at T7 and T8 (5-12, 5-5). There is no suspicious marrow signal abnormality or other marrow edema. Cord:  Normal in signal and morphology. Paraspinal and other soft tissues: Unremarkable. Disc levels: There is overall mild degenerative endplate change and facet arthropathy in the thoracic spine. There are scattered shallow disc protrusions, most notable at T2-T3 where there is a small right foraminal/extraforaminal protrusion, but no significant  spinal canal or neural foraminal stenosis. There is no evidence of cord or nerve root impingement. MRI LUMBAR SPINE FINDINGS Segmentation: Standard; the lowest formed disc space is designated L5-S1. Alignment: There is trace retrolisthesis of L1 on L2 and L2 on L3, similar to 2015. Vertebrae: Vertebral body heights are preserved. Background marrow signal is normal. There is degenerative endplate marrow signal abnormality with edema along the L2-L3 disc space. There is no suspicious marrow signal abnormality or other marrow edema. Conus medullaris and cauda equina: Conus extends to the L2 level. Conus and cauda equina appear normal. Paraspinal and other soft tissues: There is 2.6 cm cystic lesion in the right pelvis which may be related to the right adnexa or bowel for which no specific imaging follow-up is required. The paraspinal soft tissues are unremarkable. Disc levels: There is moderate disc degeneration at L2-L3 and more mild disc degeneration elsewhere in the lumbar spine, progressed since 2015. T12-L1: Mild facet arthropathy without significant spinal canal or neural foraminal stenosis L1-L2: Trace retrolisthesis with a mild disc bulge  eccentric to the right and mild facet arthropathy but no significant spinal canal or neural foraminal stenosis. L2-L3: There is trace retrolisthesis with a diffuse disc bulge, degenerative endplate change, and mild bilateral facet arthropathy resulting in mild spinal canal and left worse than right subarticular zone narrowing with possible irritation of the traversing left L3 nerve root, and mild bilateral neural foraminal stenosis. Findings at this level are progressed since 2015 L3-L4: Mild facet arthropathy with trace effusions and a small posteriorly projecting left synovial cyst without significant spinal canal or neural foraminal stenosis L4-L5: Mild-to-moderate bilateral facet arthropathy with a small posteriorly projecting left synovial cyst and a minimal disc bulge resulting in mild left and no significant right neural foraminal stenosis and no significant spinal canal stenosis. L5-S1: Mild facet arthropathy without significant spinal canal or neural foraminal stenosis. IMPRESSION: 1. Mild edema in the bilateral posterior elements at T7 and T8 is favored degenerative/reactive in nature but could reflect a source of pain. 2. Otherwise, minimal degenerative change of the thoracic spine without significant spinal canal or neural foraminal stenosis, and no evidence of cord or nerve root impingement. 3. Degenerative changes in the lumbar spine most advanced at L2-L3 where there is moderate disc degeneration with associated degenerative endplate edema, a mild disc bulge, and facet arthropathy resulting in mild spinal canal and left worse than right subarticular zone narrowing with possible irritation of the traversing left L3 nerve root, and mild bilateral neural foraminal stenosis. Findings are progressed since 2015. 4. Overall mild degenerative changes elsewhere in the lumbar spine without significant spinal canal or neural foraminal stenosis as detailed above. Electronically Signed   By: Lesia Hausen M.D.    On: 01/24/2022 12:11   MR BRAIN WO CONTRAST  Result Date: 01/23/2022 CLINICAL DATA:  Stroke follow-up. EXAM: MRI HEAD WITHOUT CONTRAST TECHNIQUE: Multiplanar, multiecho pulse sequences of the brain and surrounding structures were obtained without intravenous contrast. COMPARISON:  Head CT and CTA from yesterday FINDINGS: Brain: No acute infarction, hemorrhage, hydrocephalus, extra-axial collection or mass lesion. Vascular: Normal flow voids. Skull and upper cervical spine: Normal marrow signal. Sinuses/Orbits: Negative. Other: Nasopharyngeal retention cysts, incidental. IMPRESSION: Negative brain MRI.  No acute infarct. Electronically Signed   By: Tiburcio Pea M.D.   On: 01/23/2022 05:41   CT ANGIO HEAD NECK W WO CM (CODE STROKE)  Result Date: 01/22/2022 CLINICAL DATA:  Syncopal episode. Left-sided weakness. Possible right facial droop. EXAM: CT ANGIOGRAPHY HEAD  AND NECK TECHNIQUE: Multidetector CT imaging of the head and neck was performed using the standard protocol during bolus administration of intravenous contrast. Multiplanar CT image reconstructions and MIPs were obtained to evaluate the vascular anatomy. Carotid stenosis measurements (when applicable) are obtained utilizing NASCET criteria, using the distal internal carotid diameter as the denominator. RADIATION DOSE REDUCTION: This exam was performed according to the departmental dose-optimization program which includes automated exposure control, adjustment of the mA and/or kV according to patient size and/or use of iterative reconstruction technique. CONTRAST:  75mL OMNIPAQUE IOHEXOL 350 MG/ML SOLN COMPARISON:  CT head without contrast 01/22/2022 FINDINGS: CTA NECK FINDINGS Aortic arch: A 3 vessel arch configuration is present. No focal vascular abnormality is present. Right carotid system: Right common carotid artery is within normal limits. Bifurcation is unremarkable. Cervical right ICA is normal. Left carotid system: Left common carotid  artery is within normal limits. Motion artifact is present at the bifurcation. No significant stenosis is present. Cervical left ICA is normal. Vertebral arteries: Left vertebral artery is the dominant vessel. Both vertebral arteries originate the subclavian arteries. No significant stenosis is present in either vertebral artery in the neck. Skeleton: Mild endplate degenerative changes are present at C5-6 and C6-7. No focal osseous lesions are present. Other neck: Soft tissues the neck are otherwise unremarkable. Salivary glands are within normal limits. Thyroid is normal. No significant adenopathy is present. No focal mucosal or submucosal lesions are present. Upper chest: Lung apices are clear. Thoracic inlet is within limits. Review of the MIP images confirms the above findings CTA HEAD FINDINGS Anterior circulation: Internal carotid arteries are within normal limits from the skull base the ICA termini bilaterally. The A1 and M1 segments are normal. The anterior communicating artery is patent. ACA and MCA branch vessels are within normal limits bilaterally. Posterior circulation: Left vertebral artery is dominant. PICA origin is visualized and normal. Vertebrobasilar junction basilar artery is normal. Both posterior cerebral arteries originate from basilar tip. PCA branch vessels are normal bilaterally. Venous sinuses: The dural sinuses are patent. The straight sinus and deep cerebral veins are intact. Cortical veins are within normal limits. No significant vascular malformation is evident. Review of the MIP images confirms the above findings IMPRESSION: 1. Normal variant CTA Circle of Willis without significant proximal stenosis, aneurysm, or branch vessel occlusion. 2. Normal CTA of the neck.  No focal stenosis. 3. Mild endplate degenerative changes at C5-6 and C6-7. Electronically Signed   By: Marin Roberts M.D.   On: 01/22/2022 18:41   CT HEAD CODE STROKE WO CONTRAST`  Result Date:  01/22/2022 CLINICAL DATA:  Code stroke. Syncope/presyncope. Cerebrovascular cause suspected. Patient became dizzy passed out for 15-20 minutes. New onset left-sided weakness numbness. EXAM: CT HEAD WITHOUT CONTRAST TECHNIQUE: Contiguous axial images were obtained from the base of the skull through the vertex without intravenous contrast. RADIATION DOSE REDUCTION: This exam was performed according to the departmental dose-optimization program which includes automated exposure control, adjustment of the mA and/or kV according to patient size and/or use of iterative reconstruction technique. COMPARISON:  None Available. FINDINGS: Brain: No acute infarct, hemorrhage, or mass lesion is present. No significant white matter lesions are present. The ventricles are of normal size. Deep brain nuclei are within normal limits. No significant extraaxial fluid collection is present. The brainstem and cerebellum are within normal limits. Vascular: No hyperdense vessel or unexpected calcification. Skull: Calvarium is intact. No focal lytic or blastic lesions are present. No significant extracranial soft tissue lesion is present. Sinuses/Orbits:  The paranasal sinuses and mastoid air cells are clear. The globes and orbits are within normal limits. ASPECTS [facility]([place] Stroke Program Early CT Score) - Ganglionic level infarction (caudate, lentiform nuclei, internal capsule, insula, M1-M3 cortex): 7/7 - Supraganglionic infarction (M4-M6 cortex): 3/3 Total score (0-10 with 10 being normal): 10/10 IMPRESSION: 1. Normal CT of the head. 2. Aspects is 10/10. These results were called by telephone at the time of interpretation on 01/22/2022 at 4:13 pm to provider [facility] , who verbally acknowledged these results. Electronically Signed   By: Marin Robertshristopher  Mattern M.D.   On: 01/22/2022 16:15   CT Abdomen Pelvis W Contrast  Result Date: 01/18/2022 CLINICAL DATA:  Abdominal pain, acute, nonlocalized EXAM: CT ABDOMEN AND PELVIS WITH  CONTRAST TECHNIQUE: Multidetector CT imaging of the abdomen and pelvis was performed using the standard protocol following bolus administration of intravenous contrast. RADIATION DOSE REDUCTION: This exam was performed according to the departmental dose-optimization program which includes automated exposure control, adjustment of the mA and/or kV according to patient size and/or use of iterative reconstruction technique. CONTRAST:  100mL OMNIPAQUE IOHEXOL 300 MG/ML  SOLN COMPARISON:  CT 06/07/2021 FINDINGS: Lower chest: Small peripheral opacity in the right lower lobe series 4, image 50. No pleural effusion. Hepatobiliary: The liver is enlarged spanning 20.4 cm cranial caudal. Mild subjective steatosis. No focal liver lesion. Clips in the gallbladder fossa postcholecystectomy. No biliary dilatation. Pancreas: No ductal dilatation or inflammation. Spleen: Upper normal in size measuring 13.3 x 6.3 x 10.2 cm (volume = 450 cm^3). No focal abnormality. Adrenals/Urinary Tract: Normal adrenal glands. Punctate nonobstructing stones in the upper and lower pole of the left kidney. No hydronephrosis. No perinephric edema. No ureteral stone. Small subcentimeter hypodensity in the lateral left kidney is too small to characterize, likely small cyst. No further imaging follow-up is recommended. Unremarkable urinary bladder. Stomach/Bowel: Tiny hiatal hernia. Unremarkable stomach. No bowel obstruction or inflammatory change. The appendix is normal. Minimal sigmoid colonic diverticulosis. No diverticulitis or acute colonic inflammation. Vascular/Lymphatic: Normal caliber abdominal aorta. Patent portal vein. No enlarged lymph nodes in the abdomen or pelvis. Reproductive: Uterus and bilateral adnexa are unremarkable. Other: No ascites. No abdominopelvic collection. Tiny fat containing umbilical hernia. Musculoskeletal: L2-L3 degenerative disc disease. There are no acute or suspicious osseous abnormalities. IMPRESSION: 1. No acute  abnormality in the abdomen/pelvis. 2. Punctate nonobstructing left nephrolithiasis. 3. Hepatomegaly and mild hepatic steatosis. Borderline splenomegaly. 4. Small peripheral opacity in the right lower lobe may represent pneumonia or atelectasis. Electronically Signed   By: Narda RutherfordMelanie  Sanford M.D.   On: 01/18/2022 19:05       HISTORY OF PRESENT ILLNESS Patient with a history of HTN and PE presented with an episode of dizziness with loss of consciousness for about 20 minutes.  Upon awakening, she had headache, left sided weakness and left sided numbness.   HOSPITAL COURSE Patient was treated with TNK for presumptive stroke, but MRI showed no acute infarct.  Left sided numbness and weakness continued with splitting of sensation to touch and vibration at midline of forehead and positive Hoover sign.  PT/OT recommend discharge to CIR due to functional deficits and need for rehabilitation before patient can resume usual activities.  Strokelike episode with left sided weakness and numbness and nonorganic features treated with IV TNK.     Code Stroke CT head No acute abnormality. ASPECTS 10.    CTA head & neck No LVO or hemodynamically significant stenosis MRI  No acute infarct MRI Spine- No major compression 2D Echo  pending MRI thoracic and lumbar spine mild edema at T7 and T8 posterior elements, minimal degenerative change of thoracic spine, degenerative changes in lumbar spine most advanced at L2-L3 with mild disc bulge LDL 71 HgbA1c 5.1 VTE prophylaxis - SCDs       Diet    Diet regular Room service appropriate? Yes; Fluid consistency: Thin        No antithrombotic prior to admission, now on No antithrombotic as she is < 24 hours from TNK Therapy recommendations:  CIR Disposition:  pending   Hypertension Home meds:  none Stable Keep BP <180/105 Long-term BP goal normotensive   Hyperlipidemia Home meds:  none LDL 71, goal < 70 Add atorvastatin 40 mg daily  Continue statin at  discharge   Mid and low back pain Patient c/o left sided back pain radiating around her abdomen with shooting pain down the left leg when it is raised off the bed MRI shows no major compression or abnormality, some degenerative changes   Other Stroke Risk Factors Obesity, Body mass index is 55.23 kg/m., BMI >/= 30 associated with increased stroke risk, recommend weight loss, diet and exercise as appropriate  DISCHARGE EXAM Blood pressure (!) 151/69, pulse 65, temperature 98.3 F (36.8 C), temperature source Oral, resp. rate 16, height 5\' 10"  (1.778 m), weight (!) 175 kg, SpO2 96 %. General:  Alert, well-developed, well-nourished middle-age Caucasian obese lady patient in no acute distress Respiratory:  Regular, unlabored respirations on room air   NEURO:  Mental Status: AA&Ox3  Speech/Language: speech is without dysarthria or aphasia.  Fluency and comprehension intact.   Cranial Nerves:  II: PERRL.  III, IV, VI: EOMI. Eyelids elevate symmetrically.  V: Sensation is intact to light touch and diminished on left with midline splitting to touch VII: Smile is symmetrical.  VIII: hearing intact to voice. IX, X: Phonation is normal.  XII: tongue is midline without fasciculations. Motor: 5/5 strength to RUE and RLE, antigravity strength in LUE and LLE with very poor effort and positive Hoover sign Tone: is normal and bulk is normal Sensation- Intact to light touch bilaterally but diminished on the left hemibody including forehead with splitting of the midline for touch as well as vibration Gait- deferred  Discharge Diet      Diet   Diet regular Room service appropriate? Yes; Fluid consistency: Thin   liquids  DISCHARGE PLAN Disposition:  Transfer to [facility] for ongoing PT, OT and ST No antithrombotic for secondary stroke prevention. Recommend ongoing stroke risk factor control by Primary Care Physician at time of discharge from inpatient  rehabilitation. Follow-up PCP Alvira Monday, FNP in 2 weeks following discharge from rehab. Follow up with pain clinic   33 minutes were spent preparing discharge.  Patient seen and examined by NP/APP with MD. MD to update note as needed.   Janine Ores, DNP, FNP-BC [facility] Pager: [phone]  I have personally obtained history,examined this patient, reviewed notes, independently viewed imaging studies, participated in medical decision making and plan of care.ROS completed by me personally and pertinent positives fully documented  I have made any additions or clarifications directly to the above note. Agree with note above. v  Antony Contras, MD Medical Director [facility] Pager: [phone] 01/26/2022 4:55 PM

## 2022-01-24 NOTE — Progress Notes (Signed)
Physical Therapy Treatment Patient Details Name: Tracie Green MRN: [id number] DOB: 1975/12/11 Today's Date: 01/24/2022   History of Present Illness 46 y.o. female with a PMHx of HTN, PE, allergic rhinitis, Candida intertrigo and recent abdominal pain. She has been transferred to [facility] following TNK administration at the [facility] ED on Sunday afternoon for presumed stroke with L side weakness and syncope. Negative brain MRI. Positive for UTI.  Plans for spinal MRI.    PT Comments    Patient progressing today able to take a few steps forward, but limited by pain in her back and L LE weakness.  She was also dizzy in standing with SBP drop from 143 seated to 126 in standing and RN aware.  Patient not stable to d/c home alone today.  Continue to feel she may need some sort of inpatient rehab prior to d/c home.  If she dose not qualify for AIR, may need STSNF, though feel frequency, intensity and LOS of AIR preferred.  PT to continue to follow acutely and encouraged more upright activity with nursing assist.   Recommendations for follow up therapy are one component of a multi-disciplinary discharge planning process, led by the attending physician.  Recommendations may be updated based on patient status, additional functional criteria and insurance authorization.  Follow Up Recommendations  Acute inpatient rehab (3hours/day)     Assistance Recommended at Discharge Intermittent Supervision/Assistance  Patient can return home with the following A little help with walking and/or transfers;A little help with bathing/dressing/bathroom;Assist for transportation;Direct supervision/assist for medications management;Assistance with cooking/housework   Equipment Recommendations  Rolling walker (2 wheels)    Recommendations for Other Services       Precautions / Restrictions Precautions Precautions: Fall Precaution Comments: L side weakness; watch BP Restrictions Weight Bearing Restrictions: No      Mobility  Bed Mobility Overal bed mobility: Needs Assistance Bed Mobility: Rolling, Sidelying to Sit, Sit to Sidelying Rolling: Min assist Sidelying to sit: Mod assist, Min assist       General bed mobility comments: pt rolling toward L side and heavy use of bed rail to pull toward L side. pt then using core to attempt to sit up with mod (A) from therapist pt pulling on therapist for (A). pt initiated L UE with transfer with positioning of L UE    Transfers Overall transfer level: Needs assistance Equipment used: Rolling walker (2 wheels) Transfers: Sit to/from Stand Sit to Stand: +2 physical assistance, Min assist           General transfer comment: pt completed sit<>Stand x3 this session with normal surface height. pt with mod cues to push up with R UE and L UE on RW. pt able to sustain L UE on RW without (A). pt at one point in standing resting L UE and using R UE only on RW    Ambulation/Gait Ambulation/Gait assistance: +2 safety/equipment, Min assist Gait Distance (Feet): 3 Feet Assistive device: Rolling walker (2 wheels) Gait Pattern/deviations: Shuffle, Decreased step length - right, Decreased stance time - left, Antalgic       General Gait Details: steps forward with RW and +2 A for safety almost hop stepping to avoid much pressure on L LE as fearful of weakness and pain then increased back pain so PT went to get recliner, but pt became more dizzy and placed bed behind her to allow her to sit,  Noted BP lower in standing so placed recliner and allowed pt to sit after one more step forward  Stairs             Wheelchair Mobility    Modified Rankin (Stroke Patients Only) Modified Rankin (Stroke Patients Only) Pre-Morbid Rankin Score: No symptoms Modified Rankin: Moderately severe disability     Balance Overall balance assessment: Needs assistance Sitting-balance support: Feet supported Sitting balance-Leahy Scale: Poor Sitting balance - Comments:  holding onto footboard while sitting on EOB   Standing balance support: Bilateral upper extremity supported Standing balance-Leahy Scale: Poor Standing balance comment: UE support with A for balance                            Cognition Arousal/Alertness: Awake/alert Behavior During Therapy: WFL for tasks assessed/performed Overall Cognitive Status: Within Functional Limits for tasks assessed                                          Exercises      General Comments General comments (skin integrity, edema, etc.): SBP 143 in sitting and 126 in standing; pt symptomatic and RN aware      Pertinent Vitals/Pain Pain Assessment Pain Score: 9  Pain Location: back and L side Pain Descriptors / Indicators: Grimacing, Guarding, Tingling, Shooting, Sharp Pain Intervention(s): Monitored during session, Repositioned, Premedicated before session    Home Living Family/patient expects to be discharged to:: Private residence Living Arrangements: Alone Available Help at Discharge: Family;Available PRN/intermittently Type of Home: Apartment (converted motel with efficiency) Home Access: Level entry       Home Layout: One level Home Equipment: None Additional Comments: works as Mining engineer            PT Goals (current goals can now be found in the care plan section) Progress towards PT goals: Progressing toward goals    Frequency    Min 3X/week      PT Plan Current plan remains appropriate    Co-evaluation PT/OT/SLP Co-Evaluation/Treatment: Yes Reason for Co-Treatment: For patient/therapist safety;To address functional/ADL transfers PT goals addressed during session: Mobility/safety with mobility;Balance;Proper use of DME;Strengthening/ROM OT goals addressed during session: ADL's and self-care;Strengthening/ROM      AM-PAC PT "6 Clicks" Mobility   Outcome Measure  Help needed turning from your back to your side while in a flat bed  without using bedrails?: A Little Help needed moving from lying on your back to sitting on the side of a flat bed without using bedrails?: A Lot Help needed moving to and from a bed to a chair (including a wheelchair)?: Total Help needed standing up from a chair using your arms (e.g., wheelchair or bedside chair)?: A Lot Help needed to walk in hospital room?: Total Help needed climbing 3-5 steps with a railing? : Total 6 Click Score: 10    End of Session Equipment Utilized During Treatment: Gait belt Activity Tolerance: Patient limited by pain;Patient limited by fatigue Patient left: in chair;with call bell/phone within reach;with chair alarm set Nurse Communication: Mobility status PT Visit Diagnosis: Other abnormalities of gait and mobility (R26.89);Other symptoms and signs involving the nervous system (R29.898);Pain Pain - Right/Left: Left Pain - part of body: Leg     Time: [id number] PT Time Calculation (min) (ACUTE ONLY): 33 min  Charges:  $Therapeutic Activity: 8-22 mins  Magda Kiel, PT Acute Rehabilitation Services [phone] 01/24/2022    [name] 01/24/2022, 1:24 PM

## 2022-01-24 NOTE — Plan of Care (Signed)
  Problem: Education: Goal: Knowledge of disease or condition will improve Outcome: Progressing Goal: Knowledge of secondary prevention will improve (MUST DOCUMENT ALL) Outcome: Progressing Goal: Knowledge of patient specific risk factors will improve (Mark N/A or DELETE if not current risk factor) Outcome: Progressing   Problem: Health Behavior/Discharge Planning: Goal: Ability to manage health-related needs will improve Outcome: Progressing   

## 2022-01-24 NOTE — Progress Notes (Addendum)
STROKE TEAM PROGRESS NOTE   INTERVAL HISTORY Patient is seen in her room with no family at the bedside.  Her vital signs have been stable and her neurological exam is unchanged.  Headache is improved with Fiorcet, but left sided abdominal/back pain continues.  PT is recommending CIR. MRI scan of the thoracic and lumbar spine are yet pending.  Physical therapy yesterday recommended inpatient rehab.  2D echo is pending Vitals:   01/24/22 0700 01/24/22 0800 01/24/22 0900 01/24/22 1000  BP: 129/86 (!) 141/88 (!) 142/119 (!) 159/90  Pulse: 62 62 82 85  Resp: 11 13 16  (!) 23  Temp:  98.1 F (36.7 C)    TempSrc:  Axillary    SpO2: 96% 95% 96% 99%  Weight:       CBC:  Recent Labs  Lab 01/18/22 1625 01/22/22 1558  WBC 8.2 12.0*  NEUTROABS 5.2 7.6  HGB 12.9 12.7  HCT 39.9 39.2  MCV 85.3 84.7  PLT 310 324    Basic Metabolic Panel:  Recent Labs  Lab 01/22/22 1558 01/24/22 0402  NA 137 135  K 3.4* 4.0  CL 101 103  CO2 26 24  GLUCOSE 82 91  BUN 19 12  CREATININE 1.74* 1.25*  CALCIUM 9.4 8.9    Lipid Panel:  Recent Labs  Lab 01/23/22 0306  CHOL 127  TRIG 52  HDL 46  CHOLHDL 2.8  VLDL 10  LDLCALC 71    HgbA1c: No results for input(s): "HGBA1C" in the last 168 hours. Urine Drug Screen: No results for input(s): "LABOPIA", "COCAINSCRNUR", "LABBENZ", "AMPHETMU", "THCU", "LABBARB" in the last 168 hours.  Alcohol Level  Recent Labs  Lab 01/22/22 1558  ETH <10     IMAGING past 24 hours No results found.  PHYSICAL EXAM General:  Alert, well-developed, well-nourished middle-age Caucasian obese lady patient in no acute distress Respiratory:  Regular, unlabored respirations on room air  NEURO:  Mental Status: AA&Ox3  Speech/Language: speech is without dysarthria or aphasia.  Fluency and comprehension intact.  Cranial Nerves:  II: PERRL.  III, IV, VI: EOMI. Eyelids elevate symmetrically.  V: Sensation is intact to light touch and diminished on left with midline  splitting to touch VII: Smile is symmetrical.  VIII: hearing intact to voice. IX, X: Phonation is normal.  XII: tongue is midline without fasciculations. Motor: 5/5 strength to RUE and RLE, antigravity strength in LUE and LLE with very poor effort and positive Hoover sign Tone: is normal and bulk is normal Sensation- Intact to light touch bilaterally but diminished on the left hemibody including forehead with splitting of the midline for touch as well as vibration Gait- deferred   ASSESSMENT/PLAN Ms. Eyana Stolze is a 46 y.o. female with history of HTN, PE and recent abdominal pain presenting with an episode of dizziness and lightheadedness with loss of consciousness for about 20 minutes.  After patient regained consciousness, she had headache, left sided weakness and left sided numbness.  TNK was given.  Patient continues to have left sided numbness with splitting at the midline and positive Hoover sign.  MRI is negative for acute stroke.  Today, she complains of mid and lower back pain with shooting pain down the left leg when it is raised off the bed.  Will obtain thoracic and lumbar spine MRI.  Strokelike episode with left sided weakness and numbness and nonorganic features treated with IV TNK.    Code Stroke CT head No acute abnormality. ASPECTS 10.    CTA head &  neck No LVO or hemodynamically significant stenosis MRI  No acute infarct 2D Echo pending MRI thoracic and lumbar spine mild edema at T7 and T8 posterior elements, minimal degenerative change of thoracic spine, degenerative changes in lumbar spine most advanced at L2-L3 with mild disc bulge LDL 71 HgbA1c 5.1 VTE prophylaxis - SCDs    Diet   Diet regular Room service appropriate? Yes; Fluid consistency: Thin   No antithrombotic prior to admission, now on No antithrombotic as she is < 24 hours from TNK Therapy recommendations:  CIR Disposition:  pending  Hypertension Home meds:  none Stable Keep BP <180/105 Long-term BP  goal normotensive  Hyperlipidemia Home meds:  none LDL 71, goal < 70 Add atorvastatin 40 mg daily  Continue statin at discharge  Mid and low back pain Patient c/o left sided back pain radiating around her abdomen with shooting pain down the left leg when it is raised off the bed Will obtain thoracic and lumbar spine MRI  Other Stroke Risk Factors Obesity, Body mass index is 55.23 kg/m., BMI >/= 30 associated with increased stroke risk, recommend weight loss, diet and exercise as appropriate   Other Active Problems none  Hospital day # McRae , MSN, AGACNP-BC [facility] See Amion for schedule and pager information 01/24/2022 11:39 AM   I have personally obtained history,examined this patient, reviewed notes, independently viewed imaging studies, participated in medical decision making and plan of care.ROS completed by me personally and pertinent positives fully documented  I have made any additions or clarifications directly to the above note. Agree with note above.  Patient continues to have subjective left hemibody sensory loss and nonorganic pattern as well as now back and leg pain.  Continue mobilization out of bed.  Transfer to neurology floor bed.  Check MRI scan of the thoracic and lumbar spine as well as echocardiogram.  There is an 50% time during this 35-minute visit was spent in counseling and coordination of care and discussion patient and care team and answering questions.  Antony Contras, MD Medical Director [place] Pager: [phone] 01/24/2022 2:03 PM    To contact Stroke Continuity provider, please refer to [email or web address] After hours, contact General Neurology

## 2022-01-25 ENCOUNTER — Inpatient Hospital Stay (HOSPITAL_COMMUNITY): Payer: BLUE CROSS/BLUE SHIELD

## 2022-01-25 DIAGNOSIS — R2990 Unspecified symptoms and signs involving the nervous system: Secondary | ICD-10-CM

## 2022-01-25 DIAGNOSIS — I10 Essential (primary) hypertension: Secondary | ICD-10-CM | POA: Diagnosis not present

## 2022-01-25 DIAGNOSIS — M549 Dorsalgia, unspecified: Secondary | ICD-10-CM

## 2022-01-25 DIAGNOSIS — I6389 Other cerebral infarction: Secondary | ICD-10-CM | POA: Diagnosis not present

## 2022-01-25 LAB — URINE CULTURE: Culture: >=100000 — AB

## 2022-01-25 LAB — ECHOCARDIOGRAM COMPLETE
Area-P 1/2: 3.6 cm2
S' Lateral: 4.1 cm
Single Plane A4C EF: 43.1 %
Weight: 6158.77 oz

## 2022-01-25 MED ORDER — ACETAMINOPHEN-CODEINE 300-30 MG PO TABS
1.0000 | ORAL_TABLET | Freq: Four times a day (QID) | ORAL | Status: DC | PRN
  Administered 2022-01-25 (×2): 2 via ORAL
  Filled 2022-01-25 (×2): qty 2

## 2022-01-25 MED ORDER — TOPIRAMATE 100 MG PO TABS
100.0000 mg | ORAL_TABLET | Freq: Two times a day (BID) | ORAL | Status: DC
  Administered 2022-01-25 – 2022-01-26 (×2): 100 mg via ORAL
  Filled 2022-01-25 (×2): qty 1

## 2022-01-25 MED ORDER — METHYLPREDNISOLONE 4 MG PO TBPK
4.0000 mg | ORAL_TABLET | ORAL | Status: AC
  Administered 2022-01-25: 4 mg via ORAL

## 2022-01-25 MED ORDER — METHYLPREDNISOLONE 4 MG PO TBPK: 4.0000 mg | ORAL_TABLET | Freq: Four times a day (QID) | ORAL | Status: DC

## 2022-01-25 MED ORDER — TIZANIDINE HCL 4 MG PO TABS
2.0000 mg | ORAL_TABLET | Freq: Two times a day (BID) | ORAL | Status: DC
  Administered 2022-01-25 – 2022-01-26 (×3): 2 mg via ORAL
  Filled 2022-01-25 (×3): qty 1

## 2022-01-25 MED ORDER — METHYLPREDNISOLONE 4 MG PO TBPK
4.0000 mg | ORAL_TABLET | Freq: Three times a day (TID) | ORAL | Status: AC
  Administered 2022-01-26 (×3): 4 mg via ORAL

## 2022-01-25 MED ORDER — METHYLPREDNISOLONE 4 MG PO TBPK
8.0000 mg | ORAL_TABLET | Freq: Every morning | ORAL | Status: AC
  Administered 2022-01-25: 8 mg via ORAL
  Filled 2022-01-25 (×2): qty 21

## 2022-01-25 MED ORDER — METHYLPREDNISOLONE 4 MG PO TBPK: 8.0000 mg | ORAL_TABLET | Freq: Every evening | ORAL | Status: DC

## 2022-01-25 MED ORDER — METHYLPREDNISOLONE 4 MG PO TBPK
8.0000 mg | ORAL_TABLET | Freq: Every evening | ORAL | Status: AC
  Administered 2022-01-26: 8 mg via ORAL

## 2022-01-25 NOTE — TOC Initial Note (Signed)
Transition of Care [facility]) - Initial/Assessment Note    Patient Details  Name: Tracie Green MRN: [id number] Date of Birth: 11-14-1975  Transition of Care [facility]) CM/SW Contact:    Pollie Friar, RN Phone Number: 01/25/2022, 10:15 AM  Clinical Narrative:                 Patient is from home alone but plans to dc to her ex-husbands home. Her son lives next door to her ex-spouse and will assist in providing needed care/ supervision at dc. Recommendations are for CIR. Pt's insurance is not in network with [facility]. CM met with the patient and she prefers to see about [facility] in [place]. CM sent the referral through the [place] [facility].  If accepted pt will need insurance auth from [place]. She will transport via ambulance to the facility. TOC following.  Expected Discharge Plan: IP Rehab Facility Barriers to Discharge: Continued Medical Work up   Patient Goals and CMS Choice   CMS Medicare.gov Compare Post Acute Care list provided to:: Patient Choice offered to / list presented to : Patient  Expected Discharge Plan and Services Expected Discharge Plan: [place]   Discharge Planning Services: CM Consult Post Acute Care Choice: IP Rehab Living arrangements for the past 2 months: Apartment                                      Prior Living Arrangements/Services Living arrangements for the past 2 months: Apartment Lives with:: Self Patient language and need for interpreter reviewed:: Yes Do you feel safe going back to the place where you live?: Yes      Need for Family Participation in Patient Care: Yes (Comment) Care giver support system in place?: Yes (comment)   Criminal Activity/Legal Involvement Pertinent to Current Situation/Hospitalization: No - Comment as needed  Activities of Daily Living Home Assistive Devices/Equipment: None ADL Screening (condition at time of admission) Patient's cognitive ability adequate to safely complete daily activities?: Yes Is the  patient deaf or have difficulty hearing?: No Does the patient have difficulty seeing, even when wearing glasses/contacts?: No Does the patient have difficulty concentrating, remembering, or making decisions?: No Patient able to express need for assistance with ADLs?: Yes Does the patient have difficulty dressing or bathing?: Yes Independently performs ADLs?: No Does the patient have difficulty walking or climbing stairs?: Yes Weakness of Legs: Left Weakness of Arms/Hands: Left  Permission Sought/Granted                  Emotional Assessment Appearance:: Appears stated age Attitude/Demeanor/Rapport: Engaged Affect (typically observed): Accepting Orientation: : Oriented to Self, Oriented to Place, Oriented to  Time, Oriented to Situation   Psych Involvement: No (comment)  Admission diagnosis:  CVA (cerebral vascular accident) ([place]) [I63.9] AKI (acute kidney injury) ([place]) [N17.9] Cerebrovascular accident (CVA), unspecified mechanism ([place]) [I63.9] Stroke (cerebrum) [facility]) [I63.9] Patient Active Problem List   Diagnosis Date Noted   CVA (cerebral vascular accident) ([place]) 01/22/2022   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   Abdominal pain 12/25/2021   Allergic rhinitis 12/25/2021   Candidal intertrigo 12/25/2021   Vaginal discharge 12/25/2021   Acute cystitis without hematuria 01/03/2020   Essential hypertension 01/03/2020   Right sided abdominal pain 01/03/2020   Fibula fracture 01/29/2014   PCP:  Alvira Monday, [place] [facility]:   [facility] [address], [place]  [place] [address] Phone: [phone] Fax: [phone]  [id number] [place], [place] [address] [place] [address] Phone: [phone] Fax: [phone]  [id number] - [place], [place] [address] [place] [address] Phone: [phone] Fax: [phone]     Social Determinants  of Health (SDOH) Interventions    Readmission Risk Interventions     No data to display

## 2022-01-25 NOTE — Plan of Care (Signed)
  Problem: Education: Goal: Knowledge of disease or condition will improve Outcome: Progressing Goal: Knowledge of secondary prevention will improve (MUST DOCUMENT ALL) Outcome: Progressing Goal: Knowledge of patient specific risk factors will improve [place] N/A or DELETE if not current risk factor) Outcome: Progressing   Problem: Health Behavior/Discharge Planning: Goal: Ability to manage health-related needs will improve Outcome: Progressing   Problem: Clinical Measurements: Goal: Respiratory complications will improve Outcome: Progressing Goal: Cardiovascular complication will be avoided Outcome: Progressing   Problem: Activity: Goal: Risk for activity intolerance will decrease Outcome: Progressing   Problem: Nutrition: Goal: Adequate nutrition will be maintained Outcome: Progressing   Problem: Coping: Goal: Level of anxiety will decrease Outcome: Progressing   Problem: Elimination: Goal: Will not experience complications related to bowel motility Outcome: Progressing Goal: Will not experience complications related to urinary retention Outcome: Progressing   Problem: Pain Managment: Goal: General experience of comfort will improve Outcome: Progressing   Problem: Safety: Goal: Ability to remain free from injury will improve Outcome: Progressing   Problem: Skin Integrity: Goal: Risk for impaired skin integrity will decrease Outcome: Progressing

## 2022-01-25 NOTE — Progress Notes (Addendum)
Physical Therapy Treatment Patient Details Name: Tracie Green MRN: [id number] DOB: 1975-10-22 Today's Date: 01/25/2022   History of Present Illness 46 y.o. female with a PMHx of HTN, PE, allergic rhinitis, Candida intertrigo and recent abdominal pain. She has been transferred to [facility] following TNK administration at the [facility] ED on Sunday afternoon for presumed stroke with L side weakness and syncope. Negative brain MRI. Positive for UTI.  Spinal MRI showing degenerative changes in the lumbar spine most advanced at L2-L3  where there is moderate disc degeneration with associated  degenerative endplate edema, a mild disc bulge, and facet arthropathy resulting in mild spinal canal and left worse than right  subarticular zone narrowing with possible irritation of the  traversing left L3 nerve root, and mild bilateral neural foraminal  stenosis.    PT Comments    Patient progressing slowly still limited by pain and L side weakness and dizziness.  She was able to take about 4 steps forward and backward with encouragement.  She was complaining of lack of sleep and pain still not under control, and remains limited with mobility.  Continue to feel she will benefit from acute inpatient rehab prior to d/c home to maximize mobility and safety. Pt left EOB working with OT for adaptive equipment education.  Will follow up during acute stay.    Recommendations for follow up therapy are one component of a multi-disciplinary discharge planning process, led by the attending physician.  Recommendations may be updated based on patient status, additional functional criteria and insurance authorization.  Follow Up Recommendations  Acute inpatient rehab (3hours/day)     Assistance Recommended at Discharge Frequent or constant Supervision/Assistance  Patient can return home with the following A little help with walking and/or transfers;A little help with bathing/dressing/bathroom;Assist for transportation;Direct  supervision/assist for medications management;Assistance with cooking/housework   Equipment Recommendations  Rolling walker (2 wheels);Wheelchair (measurements PT);BSC/3in1 (Bariatric RW, BSC and 22" wheelchair please)    Recommendations for Other Services       Precautions / Restrictions Precautions Precautions: Fall Precaution Comments: L side weakness; watch BP Restrictions Weight Bearing Restrictions: No     Mobility  Bed Mobility Overal bed mobility: Needs Assistance Bed Mobility: Supine to Sit     Supine to sit: HOB elevated, Mod assist     General bed mobility comments: OT provided lifting help for trunk    Transfers Overall transfer level: Needs assistance Equipment used: Rolling walker (2 wheels) Transfers: Sit to/from Stand Sit to Stand: Min assist, Mod assist           General transfer comment: up to stand with min to mod A and increased time, cues for hand placement and pt c/o pain    Ambulation/Gait Ambulation/Gait assistance: Min assist Gait Distance (Feet): 5 Feet Assistive device: Rolling walker (2 wheels) Gait Pattern/deviations: Step-to pattern, Shuffle, Trunk flexed, Wide base of support       General Gait Details: antalgic on L and increased time and encouragement as pt c/o pain throughout with medications not helping pain; also c/o dizziness, but BP 150's/80's in standing   Stairs             Wheelchair Mobility    Modified Rankin (Stroke Patients Only)       Balance Overall balance assessment: Needs assistance   Sitting balance-Leahy Scale: Good     Standing balance support: Bilateral upper extremity supported Standing balance-Leahy Scale: Poor Standing balance comment: more single UE support on R during L BP measurement in static  standing                            Cognition Arousal/Alertness: Awake/alert Behavior During Therapy: WFL for tasks assessed/performed Overall Cognitive Status: Within  Functional Limits for tasks assessed                                          Exercises      General Comments        Pertinent Vitals/Pain Pain Assessment Faces Pain Scale: Hurts whole lot Pain Location: back and L side Pain Descriptors / Indicators: Grimacing, Guarding, Shooting, Sharp Pain Intervention(s): Monitored during session, Limited activity within patient's tolerance    Home Living                          Prior Function            PT Goals (current goals can now be found in the care plan section) Progress towards PT goals: Progressing toward goals    Frequency    Min 3X/week      PT Plan Current plan remains appropriate    Co-evaluation PT/OT/SLP Co-Evaluation/Treatment: Yes Reason for Co-Treatment: For patient/therapist safety;To address functional/ADL transfers PT goals addressed during session: Mobility/safety with mobility;Balance;Proper use of DME;Strengthening/ROM        AM-PAC PT "6 Clicks" Mobility   Outcome Measure  Help needed turning from your back to your side while in a flat bed without using bedrails?: A Little Help needed moving from lying on your back to sitting on the side of a flat bed without using bedrails?: A Little Help needed moving to and from a bed to a chair (including a wheelchair)?: A Little Help needed standing up from a chair using your arms (e.g., wheelchair or bedside chair)?: A Lot Help needed to walk in hospital room?: Total Help needed climbing 3-5 steps with a railing? : Total 6 Click Score: 13    End of Session Equipment Utilized During Treatment: Gait belt Activity Tolerance: Patient limited by pain Patient left: in bed;Other (comment) (OT in the room)   PT Visit Diagnosis: Other abnormalities of gait and mobility (R26.89);Other symptoms and signs involving the nervous system (R29.898);Pain Pain - Right/Left: Left Pain - part of body: Leg     Time: 1335-1355 PT Time  Calculation (min) (ACUTE ONLY): 20 min  Charges:  $Therapeutic Activity: 8-22 mins                     Magda Kiel, PT Acute Rehabilitation Services Office:[phone] 01/25/2022    [name] 01/25/2022, 4:13 PM

## 2022-01-25 NOTE — Progress Notes (Signed)
  Echocardiogram 2D Echocardiogram has been performed.  Tracie Green 01/25/2022, 5:29 PM

## 2022-01-25 NOTE — Progress Notes (Signed)
Occupational Therapy Treatment Patient Details Name: Tracie Green MRN: [id number] DOB: 08-07-1975 Today's Date: 01/25/2022   History of present illness 46 y.o. female with a PMHx of HTN, PE, allergic rhinitis, Candida intertrigo and recent abdominal pain. She has been transferred to [facility] following TNK administration at the [facility] ED on Sunday afternoon for presumed stroke with L side weakness and syncope. Negative brain MRI. Positive for UTI.  Spinal MRI showing degenerative changes in the lumbar spine most advanced at L2-L3  where there is moderate disc degeneration with associated  degenerative endplate edema, a mild disc bulge, and facet arthropathy resulting in mild spinal canal and left worse than right  subarticular zone narrowing with possible irritation of the  traversing left L3 nerve root, and mild bilateral neural foraminal  stenosis.   OT comments  Pt supine on arrival and motivated to participate in OT session. Pt performing transfers with min-mod A with RW. Providing education regarding LB dressing with use of compensatory techniques and AE. Pt receptive and demonstrating understanding. Pt performing oral care sitting EOB with supervision. Due to significant change in functional status and pt support, continuing to recommend AIR to optimize safety and independence in ADL and IADL.    Recommendations for follow up therapy are one component of a multi-disciplinary discharge planning process, led by the attending physician.  Recommendations may be updated based on patient status, additional functional criteria and insurance authorization.    Follow Up Recommendations  Acute inpatient rehab (3hours/day)    Assistance Recommended at Discharge Intermittent Supervision/Assistance  Patient can return home with the following  Two people to help with walking and/or transfers;Two people to help with bathing/dressing/bathroom;Assist for transportation   Equipment Recommendations   BSC/3in1;Wheelchair (measurements OT);Wheelchair cushion (measurements OT)    Recommendations for Other Services Rehab consult    Precautions / Restrictions Precautions Precautions: Fall Precaution Comments: L side weakness; watch BP Restrictions Weight Bearing Restrictions: No       Mobility Bed Mobility Overal bed mobility: Needs Assistance Bed Mobility: Supine to Sit, Sit to Supine     Supine to sit: HOB elevated, Mod assist Sit to supine: Min assist Sit to sidelying: Mod assist, +2 for physical assistance General bed mobility comments: OT provided lifting help for trunk; and to bring LLE back into bed.    Transfers Overall transfer level: Needs assistance Equipment used: Rolling walker (2 wheels) Transfers: Sit to/from Stand Sit to Stand: Min assist, Mod assist           General transfer comment: up to stand with min to mod A and increased time, cues for hand placement and pt c/o pain     Balance Overall balance assessment: Needs assistance Sitting-balance support: Feet supported Sitting balance-Leahy Scale: Good     Standing balance support: Bilateral upper extremity supported Standing balance-Leahy Scale: Poor Standing balance comment: more single UE support on R during L BP measurement in static standing                           ADL either performed or assessed with clinical judgement   ADL Overall ADL's : Needs assistance/impaired     Grooming: Oral care;Supervision/safety;Sitting               Lower Body Dressing: Sitting/lateral leans;Minimal assistance Lower Body Dressing Details (indicate cue type and reason): Educating regarding AE for LB dressing. Min A to rise from EOB  Extremity/Trunk Assessment Upper Extremity Assessment Upper Extremity Assessment: LUE deficits/detail LUE Deficits / Details: Pt reports old rotator cuff injury. Able to use functionally, but reporting numbness with any activity  over head   Lower Extremity Assessment Lower Extremity Assessment: Defer to PT evaluation        Vision   Vision Assessment?: No apparent visual deficits   Perception     Praxis      Cognition Arousal/Alertness: Awake/alert Behavior During Therapy: WFL for tasks assessed/performed Overall Cognitive Status: Within Functional Limits for tasks assessed                                 General Comments: some increased time, but believe due to pain        Exercises      Shoulder Instructions       General Comments      Pertinent Vitals/ Pain       Pain Assessment Pain Assessment: 0-10 Pain Score: 9  Pain Location: back and L side Pain Descriptors / Indicators: Grimacing, Guarding, Shooting, Sharp Pain Intervention(s): Limited activity within patient's tolerance, Monitored during session, Repositioned  Home Living                                          Prior Functioning/Environment              Frequency  Min 2X/week        Progress Toward Goals  OT Goals(current goals can now be found in the care plan section)  Progress towards OT goals: Progressing toward goals  Acute Rehab OT Goals Patient Stated Goal: get better OT Goal Formulation: With patient Time For Goal Achievement: 02/07/22 Potential to Achieve Goals: Good ADL Goals Pt Will Perform Grooming: with modified independence;sitting Pt Will Perform Upper Body Bathing: with modified independence;sitting Pt Will Transfer to Toilet: with min assist;stand pivot transfer;bedside commode Additional ADL Goal #1: Pt will complete bed mobility mod i as precursor to adls.  Plan Discharge plan remains appropriate;Frequency remains appropriate    Co-evaluation      Reason for Co-Treatment: For patient/therapist safety;To address functional/ADL transfers PT goals addressed during session: Mobility/safety with mobility;Balance;Proper use of DME;Strengthening/ROM         AM-PAC OT "6 Clicks" Daily Activity     Outcome Measure   Help from another person eating meals?: A Little Help from another person taking care of personal grooming?: A Little Help from another person toileting, which includes using toliet, bedpan, or urinal?: A Lot Help from another person bathing (including washing, rinsing, drying)?: A Lot Help from another person to put on and taking off regular upper body clothing?: A Little Help from another person to put on and taking off regular lower body clothing?: A Lot 6 Click Score: 15    End of Session Equipment Utilized During Treatment: Rolling walker (2 wheels);Gait belt  OT Visit Diagnosis: Unsteadiness on feet (R26.81);Muscle weakness (generalized) (M62.81)   Activity Tolerance Patient tolerated treatment well   Patient Left with call bell/phone within reach;in bed;with bed alarm set   Nurse Communication Mobility status        Time: 1340-1417 OT Time Calculation (min): 37 min  Charges: OT General Charges $OT Visit: 1 Visit OT Treatments $Self Care/Home Management : 23-37 mins  Shanda Howells, OTR/L [facility] Acute Rehabilitation  Office: [phone]   Drue Novel 01/25/2022, 4:30 PM

## 2022-01-25 NOTE — Progress Notes (Addendum)
STROKE TEAM PROGRESS NOTE   INTERVAL HISTORY Patient is seen in her room with no family at the bedside.  Her vital signs have been stable and her neurological exam is unchanged.  Headache is improved with Fiorcet, but left sided abdominal/back pain continues.  PT is recommending CIR and TOC is on board for placement.  Started medrol dose pack, tylenol #3, and increased topamax to help with back pain/inflammation. She will need to follow up with a pain clinic outpatient  MRI scan of the thoracic and lumbar spine shows degenerative changes at T7-T8 the posterior elements and at L2-L3 with facet arthropathy and left greater than right foraminal narrowing. Vitals:   01/24/22 2328 01/25/22 0339 01/25/22 0737 01/25/22 1123  BP: 129/73 109/69 131/66 117/67  Pulse: 70 75 68 80  Resp: 17 18 14 14   Temp: 98.3 F (36.8 C) 98.3 F (36.8 C) 97.6 F (36.4 C) 98.1 F (36.7 C)  TempSrc: Oral  Oral Oral  SpO2: 94% 98% 99% 98%  Weight:       CBC:  Recent Labs  Lab 01/18/22 1625 01/22/22 1558  WBC 8.2 12.0*  NEUTROABS 5.2 7.6  HGB 12.9 12.7  HCT 39.9 39.2  MCV 85.3 84.7  PLT 310 818    Basic Metabolic Panel:  Recent Labs  Lab 01/22/22 1558 01/24/22 0402  NA 137 135  K 3.4* 4.0  CL 101 103  CO2 26 24  GLUCOSE 82 91  BUN 19 12  CREATININE 1.74* 1.25*  CALCIUM 9.4 8.9    Lipid Panel:  Recent Labs  Lab 01/23/22 0306  CHOL 127  TRIG 52  HDL 46  CHOLHDL 2.8  VLDL 10  LDLCALC 71    HgbA1c: No results for input(s): "HGBA1C" in the last 168 hours. Urine Drug Screen: No results for input(s): "LABOPIA", "COCAINSCRNUR", "LABBENZ", "AMPHETMU", "THCU", "LABBARB" in the last 168 hours.  Alcohol Level  Recent Labs  Lab 01/22/22 1558  ETH <10     IMAGING past 24 hours No results found.  PHYSICAL EXAM General:  Alert, well-developed, well-nourished middle-age Caucasian obese lady patient in no acute distress Respiratory:  Regular, unlabored respirations on room air  NEURO:   Mental Status: AA&Ox3  Speech/Language: speech is without dysarthria or aphasia.  Fluency and comprehension intact.  Cranial Nerves:  II: PERRL.  III, IV, VI: EOMI. Eyelids elevate symmetrically.  V: Sensation is intact to light touch and diminished on left with midline splitting to touch VII: Smile is symmetrical.  VIII: hearing intact to voice. IX, X: Phonation is normal.  XII: tongue is midline without fasciculations. Motor: 5/5 strength to RUE and RLE, antigravity strength in LUE and LLE with very poor effort   Tone: is normal and bulk is normal Sensation- Intact to light touch bilaterally but diminished on the left hemibody including forehead with splitting of the midline for touch as well as vibration Gait- deferred   ASSESSMENT/PLAN Ms. Stellarose Cerny is a 46 y.o. female with history of HTN, PE and recent abdominal pain presenting with an episode of dizziness and lightheadedness with loss of consciousness for about 20 minutes.  After patient regained consciousness, she had headache, left sided weakness and left sided numbness.  TNK was given.  Patient continues to have left sided numbness with splitting at the midline and positive Hoover sign.  MRI is negative for acute stroke.  She is still reporting mid and lower back pain with shooting pain down the left leg. Will start a medrol pack, tizanidine,  tylenol #3, and increase topamax.  Strokelike episode with left sided weakness and numbness and nonorganic features treated with IV TNK.   Exacerbation of old left sided back and leg pain likely from degenerative spine disease Code Stroke CT head No acute abnormality. ASPECTS 10.    CTA head & neck No LVO or hemodynamically significant stenosis MRI  No acute infarct 2D Echo pending MRI thoracic and lumbar spine mild edema at T7 and T8 posterior elements, minimal degenerative change of thoracic spine, degenerative changes in lumbar spine most advanced at L2-L3 with mild disc bulge LDL  71 HgbA1c 5.1 VTE prophylaxis - SCDs    Diet   Diet regular Room service appropriate? Yes; Fluid consistency: Thin   No antithrombotic prior to admission, now on No antithrombotic as she is < 24 hours from TNK Therapy recommendations:  CIR Disposition:  pending  Hypertension Home meds:  none Stable Keep BP <180/105 Long-term BP goal normotensive  Hyperlipidemia Home meds:  none LDL 71, goal < 70 Add atorvastatin 40 mg daily  Continue statin at discharge  Mid and low back pain Patient c/o left sided back pain radiating around her abdomen with shooting pain down the left leg when it is raised off the bed Will obtain thoracic and lumbar spine MRI  Other Stroke Risk Factors Obesity, Body mass index is 55.23 kg/m., BMI >/= 30 associated with increased stroke risk, recommend weight loss, diet and exercise as appropriate   Other Active Problems none  Hospital day # 3  Patient seen and examined by NP/APP with MD. MD to update note as needed.   Janine Ores, DNP, FNP-BC [facility] Pager: [phone]  I have personally obtained history,examined this patient, reviewed notes, independently viewed imaging studies, participated in medical decision making and plan of care.ROS completed by me personally and pertinent positives fully documented  I have made any additions or clarifications directly to the above note. Agree with note above.  Patient continues to have left-sided back and neck pain and MRI scan shows some degenerative thoracic and lumbar spine disease try Medrol dose.V Spiking increased dose of Topamax changes tramadol to Tylenol #3.  Mobilize out of bed continue physical Occupational Therapy who recommended inpatient rehab but it will have to be an outside facility due to patient's insurance Greater than 50% time during this 35-minute visit was spent on counseling and coordination of care about numbness weakness and pain and answering questions Antony Contras,  MD Medical Director [place] Pager: [phone] 01/25/2022 1:12 PM   To contact Stroke Continuity provider, please refer to [email or web address] After hours, contact General Neurology

## 2022-01-25 NOTE — Plan of Care (Signed)
  Problem: Education: Goal: Knowledge of disease or condition will improve 01/25/2022 0330 by Arlyce Harman, RN Outcome: Progressing 01/25/2022 0329 by Arlyce Harman, RN Outcome: Progressing Goal: Knowledge of secondary prevention will improve (MUST DOCUMENT ALL) 01/25/2022 0330 by Arlyce Harman, RN Outcome: Progressing 01/25/2022 0329 by Arlyce Harman, RN Outcome: Progressing Goal: Knowledge of patient specific risk factors will improve Elta Gu[place] N/A or DELETE if not current risk factor) 01/25/2022 0330 by Arlyce Harman, RN Outcome: Progressing 01/25/2022 0329 by Arlyce Harman, RN Outcome: Progressing   Problem: Health Behavior/Discharge Planning: Goal: Ability to manage health-related needs will improve 01/25/2022 0330 by Arlyce Harman, RN Outcome: Progressing 01/25/2022 0329 by Arlyce Harman, RN Outcome: Progressing   Problem: Clinical Measurements: Goal: Respiratory complications will improve 01/25/2022 0330 by Arlyce Harman, RN Outcome: Progressing 01/25/2022 0329 by Arlyce Harman, RN Outcome: Progressing Goal: Cardiovascular complication will be avoided 01/25/2022 0330 by Arlyce Harman, RN Outcome: Progressing 01/25/2022 0329 by Arlyce Harman, RN Outcome: Progressing   Problem: Activity: Goal: Risk for activity intolerance will decrease 01/25/2022 0330 by Arlyce Harman, RN Outcome: Progressing 01/25/2022 0329 by Arlyce Harman, RN Outcome: Progressing   Problem: Nutrition: Goal: Adequate nutrition will be maintained 01/25/2022 0330 by Arlyce Harman, RN Outcome: Progressing 01/25/2022 0329 by Arlyce Harman, RN Outcome: Progressing   Problem: Coping: Goal: Level of anxiety will decrease 01/25/2022 0330 by Arlyce Harman, RN Outcome: Progressing 01/25/2022 0329 by Arlyce Harman, RN Outcome: Progressing   Problem: Elimination: Goal: Will not experience complications related to bowel  motility 01/25/2022 0330 by Arlyce Harman, RN Outcome: Progressing 01/25/2022 0329 by Arlyce Harman, RN Outcome: Progressing Goal: Will not experience complications related to urinary retention 01/25/2022 0330 by Arlyce Harman, RN Outcome: Progressing 01/25/2022 0329 by Arlyce Harman, RN Outcome: Progressing   Problem: Pain Managment: Goal: General experience of comfort will improve 01/25/2022 0330 by Arlyce Harman, RN Outcome: Progressing 01/25/2022 0329 by Arlyce Harman, RN Outcome: Progressing   Problem: Safety: Goal: Ability to remain free from injury will improve 01/25/2022 0330 by Arlyce Harman, RN Outcome: Progressing 01/25/2022 0329 by Arlyce Harman, RN Outcome: Progressing   Problem: Skin Integrity: Goal: Risk for impaired skin integrity will decrease 01/25/2022 0330 by Arlyce Harman, RN Outcome: Progressing 01/25/2022 0329 by Arlyce Harman, RN Outcome: Progressing

## 2022-01-26 MED ORDER — TIZANIDINE HCL 2 MG PO TABS: 2.0000 mg | ORAL_TABLET | Freq: Two times a day (BID) | ORAL | 0 refills | Status: DC

## 2022-01-26 MED ORDER — ATORVASTATIN CALCIUM 40 MG PO TABS: 40.0000 mg | ORAL_TABLET | Freq: Every day | ORAL | 1 refills | Status: DC

## 2022-01-26 MED ORDER — METHYLPREDNISOLONE 4 MG PO TBPK: ORAL_TABLET | ORAL | 0 refills | Status: DC

## 2022-01-26 MED ORDER — TOPIRAMATE 100 MG PO TABS: 100.0000 mg | ORAL_TABLET | Freq: Two times a day (BID) | ORAL | 1 refills | Status: DC

## 2022-01-26 MED ORDER — METHYLPREDNISOLONE 4 MG PO TBPK: ORAL_TABLET | ORAL | 0 refills | Status: AC

## 2022-01-26 NOTE — Progress Notes (Signed)
   01/26/22 0920  Clinical Encounter Type  Visited With Patient  Visit Type Initial;Spiritual support  Referral From Nurse  Consult/Referral To Chaplain   Chaplain responded to a call from the unit asking for support of the patient.  The patient, Tracie Green is working with her healing process however received some distressing news from home. A nephew who she played a active role in his life died. Tracie Green shared stories of their interaction. She treated him as one of her own. We explored her hold those stories and when the time is right share them with his young daughter. I encouraged her to continue to heal and rest so that she can once again be with those who love her and care for her. Chaplain will continue to monitor.   Danice Goltz [facility]  [phone]

## 2022-01-26 NOTE — Progress Notes (Signed)
Pt discharged home with medical equipment wheelchair and bedside commode.  Pt tool all belongings home with her upon discharge which included 2 cell phones.  Irven Baltimore, RN

## 2022-01-26 NOTE — Progress Notes (Signed)
Physical Therapy Treatment Patient Details Name: Tracie Green MRN: [id number] DOB: 07-12-1975 Today's Date: 01/26/2022   History of Present Illness 46 y.o. female with a PMHx of HTN, PE, allergic rhinitis, Candida intertrigo and recent abdominal pain. She has been transferred to [facility] following TNK administration at the [facility] ED on Sunday afternoon for presumed stroke with L side weakness and syncope. Negative brain MRI. Positive for UTI.  Spinal MRI showing degenerative changes in the lumbar spine most advanced at L2-L3  where there is moderate disc degeneration with associated  degenerative endplate edema, a mild disc bulge, and facet arthropathy resulting in mild spinal canal and left worse than right  subarticular zone narrowing with possible irritation of the  traversing left L3 nerve root, and mild bilateral neural foraminal  stenosis.    PT Comments    Pt admitted with above diagnosis. Pt continues to make slow progress due to pain and dizziness.  She did incr distance with ambulation today. 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  Initiated compensatory strategies with pt and pt responded well.  Will continue to assess vestibular system further and probably add x 1 exercises next treatment.  MD: Johnathan Hausen may be helpful for pt as well short term.  Will continue to follow acutely and hopeful that pt will be able to go to [facility] soon.  Pt currently with functional limitations due to balance and endurance deficits. Pt will benefit from skilled PT to increase their independence and safety with mobility to allow discharge to the venue listed below.      Recommendations for follow up therapy are one component of a multi-disciplinary discharge planning process, led by the attending physician.  Recommendations may be updated based on patient status, additional functional criteria and insurance authorization.  Follow Up Recommendations  Acute inpatient  rehab (3hours/day) (Outpt PT vestibular rehab if [facility] Denies Rehab)     Assistance Recommended at Discharge Frequent or constant Supervision/Assistance  Patient can return home with the following A little help with walking and/or transfers;A little help with bathing/dressing/bathroom;Assist for transportation;Direct supervision/assist for medications management;Assistance with Education officer, environmental (measurements PT);BSC/3in1;Rollator (4 wheels) (Bariatric rollator, Bariatric BSC and 22" wheelchair with foot rests, deskarmrests, anti tippers and pressure relieving cushion  please)    Recommendations for Other Services       Precautions / Restrictions Precautions Precautions: Fall Precaution Comments: L side weakness; watch BP Restrictions Weight Bearing Restrictions: No     Mobility  Bed Mobility Overal bed mobility: Needs Assistance Bed Mobility: Supine to Sit, Sit to Supine Rolling: Min guard Sidelying to sit: Min guard, Min assist Supine to sit: HOB elevated, Min guard, Min assist     General bed mobility comments: Guidance of trunk however pt assisting alot. Incr time to scoot to EOB.    Transfers Overall transfer level: Needs assistance Equipment used: Rolling walker (2 wheels) Transfers: Sit to/from Stand Sit to Stand: Min assist, Min guard, From elevated surface           General transfer comment: up to stand with min guard A except needing min assist to steady off commode and increased time, cues for hand placement and pt c/o pain    Ambulation/Gait Ambulation/Gait assistance: Min assist, Min guard Gait Distance (Feet): 18 Feet (8 feet then 10 feet) Assistive device: Rolling walker (2 wheels) Gait Pattern/deviations: Step-to pattern, Shuffle, Trunk flexed, Wide base of support   Gait velocity interpretation: <1.31 ft/sec,  indicative of household ambulator   General Gait Details: antalgic on L and increased time and  encouragement as pt c/o pain; also c/o dizziness and closing eyes. PT encouraged pt to keep eyes open and this helped pt progress distance.   Stairs             Wheelchair Mobility    Modified Rankin (Stroke Patients Only) Modified Rankin (Stroke Patients Only) Pre-Morbid Rankin Score: No symptoms Modified Rankin: Moderately severe disability     Balance Overall balance assessment: Needs assistance Sitting-balance support: Feet supported, No upper extremity supported Sitting balance-Leahy Scale: Good     Standing balance support: Bilateral upper extremity supported Standing balance-Leahy Scale: Poor Standing balance comment: relies on RW.                            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  Educated pt in compensatory techniques to decr dizziness with movement.  will add some other exercises next visit if pt doesnt go to next venue.  Will message MD in regards to possibly giving pt Antivert until more vestibular therapy can be accomplished.      Pertinent Vitals/Pain Pain Assessment Pain Assessment: 0-10 Faces Pain Scale: Hurts whole lot Pain Location: back and L side Pain Descriptors / Indicators: Grimacing, Guarding, Shooting, Sharp Pain Intervention(s): Limited activity within patient's tolerance, Monitored during session, Repositioned    Home Living                           Prior Function            PT Goals (current goals can now be found in the care plan section) Acute Rehab PT Goals Patient Stated Goal: To get back to work Progress towards PT goals: Progressing toward goals    Frequency    Min 3X/week      PT Plan Current plan remains appropriate    Co-evaluation              AM-PAC PT "6 Clicks" Mobility   Outcome Measure  Help needed turning from your back to your side while in a flat bed without using bedrails?: A Little Help needed moving from lying on your back to sitting on the side of a flat bed without using bedrails?: A Little Help needed moving to and from a bed to a chair (including a wheelchair)?: A Little Help needed standing up from a chair using your arms (e.g., wheelchair or bedside chair)?: A Little Help needed to walk in hospital room?: A Lot Help needed climbing 3-5 steps with a railing? : Total 6 Click Score: 15    End of Session Equipment Utilized During Treatment: Gait belt Activity Tolerance: Patient limited by pain;Patient limited  by fatigue Patient left: in chair;with call bell/phone within reach Nurse Communication: Mobility status PT Visit Diagnosis: Other abnormalities of gait and mobility (R26.89);Other symptoms and signs involving the nervous system (R29.898);Pain Pain - Right/Left: Left Pain - part of body: Leg     Time: 1133-1205 PT Time Calculation (min) (ACUTE ONLY): 32 min  Charges:  $Gait Training: 8-22 mins $Self Care/Home Management: 8-22                     [facility] M,PT Acute Rehab Services [place] 01/26/2022, 12:54 PM

## 2022-01-26 NOTE — Progress Notes (Signed)
SLP Cancellation Note  Patient Details Name: Tracie Green MRN: [id number] DOB: [birth year]   Cancelled treatment:       Reason Eval/Treat Not Completed: Patient at procedure or test/unavailable. Pt with another provider then on the phone. Will attempt at another time   Lynann Beaver 01/26/2022, 10:57 AM

## 2022-01-26 NOTE — TOC Transition Note (Signed)
Transition of Care [facility]) - CM/SW Discharge Note   Patient Details  Name: Tracie Green MRN: [id number] Date of Birth: [birth year]  [facility] CM/SW Contact:  Pollie Friar, RN Phone Number: 01/26/2022, 2:02 PM   Clinical Narrative:    Patient has decided to dc home even though she received approval for [facility] IR. She is going to ex-spouses home and her children will assist her at home. She is interested in outpatient rehab. CM has sent referral to [place] and they will follow up with her for the first appointment.  Wheelchair and [facility] for home to be delivered to the patients room.  Pt will have to obtain the rollator outside the hospital setting as her insurance wont cover that and a wheelchair. Pts son to provide transport home.    Final next level of care: OP Rehab Barriers to Discharge: No Barriers Identified   Patient Goals and CMS Choice   CMS Medicare.gov Compare Post Acute Care list provided to:: Patient Choice offered to / list presented to : Patient  Discharge Placement                       Discharge Plan and Services   Discharge Planning Services: CM Consult Post Acute Care Choice: IP Rehab          DME Arranged: Bedside commode, Wheelchair manual DME Agency: [employer]                  Social Determinants of Health (SDOH) Interventions     Readmission Risk Interventions     No data to display

## 2022-01-26 NOTE — Progress Notes (Signed)
STROKE TEAM PROGRESS NOTE   INTERVAL HISTORY Patient is seen in her room with no family at the bedside.  Her vital signs have been stable and her neurological exam is unchanged.   Patient states her headache and flank and leg and shoulder pain seem to be improving.  She is unfortunately had a death in the family and her young nephew whom she helped raised died suddenly.  She has been accepted for transfer to inpatient rehab at [facility] the patient is struggling with the decision whether she should go home to be with her brother and support him from home..  Vital signs stable.  Neurological exam unchanged.   Vitals:   01/25/22 2029 01/25/22 2332 01/26/22 0318 01/26/22 0749  BP: (!) 140/76 (!) 107/59 (!) 114/59 119/72  Pulse: 72 65 62 60  Resp:  18 16 14   Temp: 98 F (36.7 C) 98 F (36.7 C) 98.1 F (36.7 C) 98.2 F (36.8 C)  TempSrc:  Oral Oral Oral  SpO2: 100% 96% 97% 96%  Weight:      Height:       CBC:  Recent Labs  Lab 01/22/22 1558  WBC 12.0*  NEUTROABS 7.6  HGB 12.7  HCT 39.2  MCV 84.7  PLT 937    Basic Metabolic Panel:  Recent Labs  Lab 01/22/22 1558 01/24/22 0402  NA 137 135  K 3.4* 4.0  CL 101 103  CO2 26 24  GLUCOSE 82 91  BUN 19 12  CREATININE 1.74* 1.25*  CALCIUM 9.4 8.9    Lipid Panel:  Recent Labs  Lab 01/23/22 0306  CHOL 127  TRIG 52  HDL 46  CHOLHDL 2.8  VLDL 10  LDLCALC 71    HgbA1c: No results for input(s): "HGBA1C" in the last 168 hours. Urine Drug Screen: No results for input(s): "LABOPIA", "COCAINSCRNUR", "LABBENZ", "AMPHETMU", "THCU", "LABBARB" in the last 168 hours.  Alcohol Level  Recent Labs  Lab 01/22/22 1558  ETH <10     IMAGING past 24 hours ECHOCARDIOGRAM COMPLETE  Result Date: 01/25/2022    ECHOCARDIOGRAM REPORT   Patient Name:   Tracie Green Date of Exam: 01/25/2022 Medical Rec #:  [id number]   Height:       70.0 in Accession #:    [id number]  Weight:       384.9 lb Date of Birth:  1975/07/20  BSA:          2.757 m  Patient Age:    46 years    BP:           118/74 mmHg Patient Gender: F           HR:           75 bpm. Exam Location:  Inpatient Procedure: 2D Echo Indications:    stroke  History:        Patient has no prior history of Echocardiogram examinations.                 Risk Factors:Hypertension.  Sonographer:    Johny Chess RDCS Referring Phys: [phone] ERIC LINDZEN  Sonographer Comments: Patient is obese. Image acquisition challenging due to patient body habitus. IMPRESSIONS  1. Left ventricular ejection fraction, by estimation, is 50 to 55%. The left ventricle has low normal function. The left ventricle has no regional wall motion abnormalities. Left ventricular diastolic parameters were normal.  2. Right ventricular systolic function is normal. The right ventricular size is normal.  3. The mitral valve is normal in  structure. Trivial mitral valve regurgitation. No evidence of mitral stenosis.  4. The aortic valve is normal in structure. Aortic valve regurgitation is not visualized. No aortic stenosis is present.  5. The inferior vena cava is normal in size with greater than 50% respiratory variability, suggesting right atrial pressure of 3 mmHg. FINDINGS  Left Ventricle: Left ventricular ejection fraction, by estimation, is 50 to 55%. The left ventricle has low normal function. The left ventricle has no regional wall motion abnormalities. The left ventricular internal cavity size was normal in size. There is no left ventricular hypertrophy. Left ventricular diastolic parameters were normal. Normal left ventricular filling pressure. Right Ventricle: The right ventricular size is normal. No increase in right ventricular wall thickness. Right ventricular systolic function is normal. Left Atrium: Left atrial size was normal in size. Right Atrium: Right atrial size was normal in size. Pericardium: There is no evidence of pericardial effusion. Mitral Valve: The mitral valve is normal in structure. Trivial mitral valve  regurgitation. No evidence of mitral valve stenosis. Tricuspid Valve: The tricuspid valve is normal in structure. Tricuspid valve regurgitation is not demonstrated. No evidence of tricuspid stenosis. Aortic Valve: The aortic valve is normal in structure. Aortic valve regurgitation is not visualized. No aortic stenosis is present. Pulmonic Valve: The pulmonic valve was normal in structure. Pulmonic valve regurgitation is not visualized. No evidence of pulmonic stenosis. Aorta: The aortic root is normal in size and structure. Venous: The inferior vena cava is normal in size with greater than 50% respiratory variability, suggesting right atrial pressure of 3 mmHg. IAS/Shunts: No atrial level shunt detected by color flow Doppler.  LEFT VENTRICLE PLAX 2D LVIDd:         5.50 cm      Diastology LVIDs:         4.10 cm      LV e' medial:    6.31 cm/s LV PW:         1.10 cm      LV E/e' medial:  9.6 LV IVS:        1.10 cm      LV e' lateral:   14.10 cm/s LVOT diam:     2.00 cm      LV E/e' lateral: 4.3 LV SV:         59 LV SV Index:   21 LVOT Area:     3.14 cm  LV Volumes (MOD) LV vol d, MOD A4C: 125.0 ml LV vol s, MOD A4C: 71.1 ml LV SV MOD A4C:     125.0 ml RIGHT VENTRICLE             IVC RV S prime:     13.60 cm/s  IVC diam: 1.40 cm TAPSE (M-mode): 1.9 cm LEFT ATRIUM           Index        RIGHT ATRIUM           Index LA diam:      3.80 cm 1.38 cm/m   RA Area:     16.30 cm LA Vol (A2C): 44.9 ml 16.29 ml/m  RA Volume:   42.70 ml  15.49 ml/m LA Vol (A4C): 42.0 ml 15.23 ml/m  AORTIC VALVE LVOT Vmax:   96.80 cm/s LVOT Vmean:  62.700 cm/s LVOT VTI:    0.187 m  AORTA Ao Root diam: 3.10 cm Ao Asc diam:  3.50 cm MITRAL VALVE MV Area (PHT): 3.60 cm    SHUNTS MV Decel Time: 211  msec    Systemic VTI:  0.19 m MV E velocity: 60.60 cm/s  Systemic Diam: 2.00 cm MV A velocity: 63.20 cm/s MV E/A ratio:  0.96 Mihai Croitoru MD Electronically signed by Thurmon Fair MD Signature Date/Time: 01/25/2022/5:36:12 PM    Final      PHYSICAL EXAM General:  Alert, well-developed, well-nourished middle-age Caucasian obese lady patient in no acute distress Respiratory:  Regular, unlabored respirations on room air  NEURO:  Mental Status: AA&Ox3  Speech/Language: speech is without dysarthria or aphasia.  Fluency and comprehension intact.  Cranial Nerves:  II: PERRL.  III, IV, VI: EOMI. Eyelids elevate symmetrically.  V: Sensation is intact to light touch and diminished on left with midline splitting to touch VII: Smile is symmetrical.  VIII: hearing intact to voice. IX, X: Phonation is normal.  XII: tongue is midline without fasciculations. Motor: 5/5 strength to RUE and RLE, antigravity strength in LUE and LLE with very poor effort   Tone: is normal and bulk is normal Sensation- Intact to light touch bilaterally but diminished on the left hemibody including forehead with splitting of the midline for touch as well as vibration Gait- deferred   ASSESSMENT/PLAN Ms. Tana Trefry is a 46 y.o. female with history of HTN, PE and recent abdominal pain presenting with an episode of dizziness and lightheadedness with loss of consciousness for about 20 minutes.  After patient regained consciousness, she had headache, left sided weakness and left sided numbness.  TNK was given.  Patient continues to have left sided numbness with splitting at the midline and positive Hoover sign.  MRI is negative for acute stroke.  She is still reporting mid and lower back pain with shooting pain down the left leg. Will start a medrol pack, tizanidine, tylenol #3, and increase topamax.  Strokelike episode with left sided weakness and numbness and nonorganic features treated with IV TNK.   Exacerbation of old left sided back and leg pain likely from degenerative spine disease Code Stroke CT head No acute abnormality. ASPECTS 10.    CTA head & neck No LVO or hemodynamically significant stenosis MRI  No acute infarct 2D Echo pending MRI thoracic  and lumbar spine mild edema at T7 and T8 posterior elements, minimal degenerative change of thoracic spine, degenerative changes in lumbar spine most advanced at L2-L3 with mild disc bulge LDL 71 HgbA1c 5.1 VTE prophylaxis - SCDs    Diet   Diet regular Room service appropriate? Yes; Fluid consistency: Thin   No antithrombotic prior to admission, now on No antithrombotic as she is < 24 hours from TNK Therapy recommendations:  CIR Disposition:  pending  Hypertension Home meds:  none Stable Keep BP <180/105 Long-term BP goal normotensive  Hyperlipidemia Home meds:  none LDL 71, goal < 70 Add atorvastatin 40 mg daily  Continue statin at discharge  Mid and low back pain Patient c/o left sided back pain radiating around her abdomen with shooting pain down the left leg when it is raised off the bed Will obtain thoracic and lumbar spine MRI  Other Stroke Risk Factors Obesity, Body mass index is 55.36 kg/m., BMI >/= 30 associated with increased stroke risk, recommend weight loss, diet and exercise as appropriate   Other Active Problems none  Hospital day # 4  I have personally obtained history,examined this patient, reviewed notes, independently viewed imaging studies, participated in medical decision making and plan of care.ROS completed by me personally and pertinent positives fully documented  I have made any additions  or clarifications directly to the above note. Agree with note above.  Patient's pain is improving and she has been accepted for transfer to inpatient rehab patient is struggling with the decision due to her death in the family.  Delia Heady, MD Medical Director [facility] Pager: [phone] 01/26/2022 1:03 PM    To contact Stroke Continuity provider, please refer to [email or web address]. After hours, contact General Neurology

## 2022-01-27 ENCOUNTER — Ambulatory Visit: Payer: 59 | Admitting: Family Medicine

## 2022-01-27 ENCOUNTER — Other Ambulatory Visit: Payer: Self-pay | Admitting: Family Medicine

## 2022-01-27 DIAGNOSIS — K219 Gastro-esophageal reflux disease without esophagitis: Secondary | ICD-10-CM

## 2022-01-27 DIAGNOSIS — J3089 Other allergic rhinitis: Secondary | ICD-10-CM

## 2022-01-30 ENCOUNTER — Telehealth: Payer: Self-pay | Admitting: Family Medicine

## 2022-01-30 ENCOUNTER — Telehealth: Payer: Self-pay

## 2022-01-30 NOTE — Telephone Encounter (Signed)
Transition Care Management Unsuccessful Follow-up Telephone Call  Date of discharge and from where:  [facility] 01/27/22  Attempts:  1st Attempt  Reason for unsuccessful TCM follow-up call:  Unable to leave message no voicemail set up

## 2022-01-30 NOTE — Telephone Encounter (Signed)
Patient called said she was discharged from hospital Friday, 01/27/2022 and TOC scheduled 10.31.2023.

## 2022-01-30 NOTE — Telephone Encounter (Signed)
Tried to call for [facility] but didn't get patient.

## 2022-01-31 ENCOUNTER — Telehealth: Payer: Self-pay

## 2022-01-31 ENCOUNTER — Inpatient Hospital Stay: Payer: BLUE CROSS/BLUE SHIELD | Admitting: Internal Medicine

## 2022-01-31 NOTE — Telephone Encounter (Signed)
Transition Care Management Follow-up Telephone Call Date of discharge and from where: 01/27/2022 [facility] How have you been since you were released from the hospital? [place] doing okay, having some depression Any questions or concerns? No  Items Reviewed: Did the pt receive and understand the discharge instructions provided? Yes  Medications obtained and verified? Yes  Other? No  Any new allergies since your discharge? No  Dietary orders reviewed? Yes Do you have support at home? Yes   Home Care and Equipment/Supplies: Were home health services ordered? Not completely sure If so, what is the name of the agency? N/A  Has the agency set up a time to come to the patient's home? not applicable Were any new equipment or medical supplies ordered?  Yes: W/C, beside commode What is the name of the medical supply agency? [employer] Were you able to get the supplies/equipment? Wasn't able to get walker Do you have any questions related to the use of the equipment or supplies? Yes: Needs a walker with a seat on it  Functional Questionnaire: (I = Independent and D = Dependent) ADLs: D  Bathing/Dressing- D  Meal Prep- I  Eating- I  Maintaining continence- I  Transferring/Ambulation- I  Managing Meds- I  Follow up appointments reviewed:  PCP Hospital f/u appt confirmed? Yes  Scheduled to see Dr. Doren Custard on 02/01/2022 @ 2:00. [facility] f/u appt confirmed? No   Are transportation arrangements needed? No  If their condition worsens, is the pt aware to call PCP or go to the Emergency Dept.? Yes Was the patient provided with contact information for the PCP's office or ED? Yes Was to pt encouraged to call back with questions or concerns? Yes

## 2022-01-31 NOTE — Telephone Encounter (Signed)
Vm not set up

## 2022-02-01 ENCOUNTER — Telehealth (INDEPENDENT_AMBULATORY_CARE_PROVIDER_SITE_OTHER): Payer: BLUE CROSS/BLUE SHIELD | Admitting: Internal Medicine

## 2022-02-01 DIAGNOSIS — R2990 Unspecified symptoms and signs involving the nervous system: Secondary | ICD-10-CM | POA: Diagnosis not present

## 2022-02-01 NOTE — Progress Notes (Unsigned)
   Video TOC Visit  Virtual Visit via Video Note  I connected with Tracie Green on 02/01/22 at  2:00 PM EDT by a video enabled telemedicine application and verified that I am speaking with the correct person using two identifiers.  Location: Patient: *** Provider: ***   I discussed the limitations of evaluation and management by telemedicine and the availability of in person appointments. The patient expressed understanding and agreed to proceed.  History of Present Illness:       Assessment and Plan:   Follow Up Instructions:    I discussed the assessment and treatment plan with the patient. The patient was provided an opportunity to ask questions and all were answered. The patient agreed with the plan and demonstrated an understanding of the instructions.   The patient was advised to call back or seek an in-person evaluation if the symptoms worsen or if the condition fails to improve as anticipated.  I provided *** minutes of non-face-to-face time during this encounter.   Johnette Abraham, MD

## 2022-02-02 NOTE — Telephone Encounter (Signed)
Unsuccessful TOC call.

## 2022-02-07 ENCOUNTER — Telehealth: Payer: Self-pay

## 2022-02-07 NOTE — Telephone Encounter (Signed)
The patient was seen last by Dr. Doren Custard. would you ask if he will be able to provide the patient with a work note

## 2022-02-07 NOTE — Telephone Encounter (Signed)
Note printed for patient. 

## 2022-02-07 NOTE — Telephone Encounter (Signed)
Patient called need paper email to her work that she is out of work due to health issues when in hospital and blood clotting until further notice. and will not be able to work. Please contact patient at [phone]. patient said just seen Dr Doren Custard.

## 2022-02-08 ENCOUNTER — Encounter: Payer: Self-pay | Admitting: Internal Medicine

## 2022-02-14 ENCOUNTER — Telehealth: Payer: Self-pay | Admitting: Family Medicine

## 2022-02-14 ENCOUNTER — Ambulatory Visit: Payer: BLUE CROSS/BLUE SHIELD | Admitting: Family Medicine

## 2022-02-14 NOTE — Telephone Encounter (Signed)
Pt needs an appt (tele will be fine)

## 2022-02-14 NOTE — Telephone Encounter (Signed)
Pt called stating she is wanting to talk with nurse about some things from her ER visit. She was unable to be seen today due to transportation.

## 2022-02-20 ENCOUNTER — Encounter: Payer: Self-pay | Admitting: Internal Medicine

## 2022-02-20 ENCOUNTER — Ambulatory Visit (INDEPENDENT_AMBULATORY_CARE_PROVIDER_SITE_OTHER): Payer: BLUE CROSS/BLUE SHIELD | Admitting: Internal Medicine

## 2022-02-20 DIAGNOSIS — N3000 Acute cystitis without hematuria: Secondary | ICD-10-CM | POA: Diagnosis not present

## 2022-02-20 DIAGNOSIS — M545 Low back pain, unspecified: Secondary | ICD-10-CM

## 2022-02-20 DIAGNOSIS — R051 Acute cough: Secondary | ICD-10-CM

## 2022-02-20 DIAGNOSIS — B372 Candidiasis of skin and nail: Secondary | ICD-10-CM | POA: Diagnosis not present

## 2022-02-20 MED ORDER — CLOTRIMAZOLE 1 % EX CREA: 1.0000 | TOPICAL_CREAM | Freq: Two times a day (BID) | CUTANEOUS | 0 refills | Status: DC

## 2022-02-20 MED ORDER — METHOCARBAMOL 750 MG PO TABS: ORAL_TABLET | ORAL | 0 refills | Status: AC

## 2022-02-20 MED ORDER — NAPROXEN 500 MG PO TABS: 500.0000 mg | ORAL_TABLET | Freq: Two times a day (BID) | ORAL | 0 refills | Status: DC

## 2022-02-20 MED ORDER — NITROFURANTOIN MONOHYD MACRO 100 MG PO CAPS: 100.0000 mg | ORAL_CAPSULE | Freq: Two times a day (BID) | ORAL | 0 refills | Status: DC

## 2022-02-20 MED ORDER — DEXTROMETHORPHAN HBR 15 MG/5ML PO SYRP: 10.0000 mL | ORAL_SOLUTION | Freq: Four times a day (QID) | ORAL | 0 refills | Status: DC | PRN

## 2022-02-20 NOTE — Progress Notes (Unsigned)
     This is a telephone encounter between Tracie Green and Tracie Green on 02/22/2022 for hospital follow up. The visit was conducted with the patient located at home and Tracie Green at [facility]. The patient's identity was confirmed using their DOB and current address. The patient has consented to being evaluated through a telephone encounter and understands the associated risks (an examination cannot be done and the patient may need to come in for an appointment) / benefits (allows the patient to remain at home, decreasing exposure to coronavirus).    CC: Hospital Follow Up for Stroke like episode   HPI:[name] is a 46 y.o. female who presents for evaluation of cough, UTI symptoms, candidal intertrigo, and back pain. For the details of today's visit, please refer to the assessment and plan.    Past Medical History:  Diagnosis Date   Hypertension    PE (pulmonary embolism)      Assessment & Plan:   Back pain Patient is experiencing acute low back pain. Patient is overweight and has some chronic back pain. Recently exacerbated after being in hospital for stroke like episode.Treatment as below, follow up in person if not improving. She has PT/OT ordered from hospital stay.  - naproxen (NAPROSYN) 500 MG tablet; Take 1 tablet (500 mg total) by mouth 2 (two) times daily with a meal.  Dispense: 20 tablet; Refill: 0 - methocarbamol (ROBAXIN) 750 MG tablet; Take 2 tablets (1,500 mg total) by mouth 3 (three) times daily for 2 days, THEN 1 tablet (750 mg total) every 8 (eight) hours as needed for up to 6 days for muscle spasms.  Dispense: 30 tablet; Refill: 0  Candidal intertrigo Reports rash in folds of skin, cream previously prescribed has helped. Refilled - clotrimazole (LOTRIMIN) 1 % cream; Apply 1 Application topically 2 (two) times daily.  Dispense: 30 g; Refill: 0  Acute cystitis without hematuria Patient is experiencing dysuria and believes she has a UTI. No fever or flank pain.  This is a telephone visit will treat with antibiotics. Follow up if not improving for further evaluation.  - nitrofurantoin, macrocrystal-monohydrate, (MACROBID) 100 MG capsule; Take 1 capsule (100 mg total) by mouth 2 (two) times daily.  Dispense: 10 capsule; Refill: 0  Cough Patient has had a cough since discharge at hospital. History of allergic rhinitis. No asthma . She is on lisinopril-hctz, but not had this side effect before.  No fever, sick contacts, swelling of tongue or lips , or chest pain. Will treat cough symptom, if not improving patient will follow up for in person evaluation.   -dextromethorphan 15 MG/5ML syrup; Take 10 mLs (30 mg total) by mouth 4 (four) times daily as needed for cough.  Dispense: 120 mL; Refill: 0   Time:   Today, I have spent 30 minutes reviewing the chart, including problem list, medications, and with the patient with telehealth technology discussing the above problems.  Tracie Banister, MD

## 2022-02-21 NOTE — Patient Instructions (Signed)
Thank you for trusting me with your care. To recap, today we discussed the following:   Acute midline low back pain - naproxen (NAPROSYN) 500 MG tablet; Take 1 tablet (500 mg total) by mouth 2 (two) times daily with a meal.  Dispense: 20 tablet; Refill: 0 - methocarbamol (ROBAXIN) 750 MG tablet; Take 2 tablets (1,500 mg total) by mouth 3 (three) times daily for 2 days, THEN 1 tablet (750 mg total) every 8 (eight) hours as needed for up to 6 days for muscle spasms.  Dispense: 30 tablet; Refill: 0  Acute cough - dextromethorphan 15 MG/5ML syrup; Take 10 mLs (30 mg total) by mouth 4 (four) times daily as needed for cough.  Dispense: 120 mL; Refill: 0  Acute cystitis without hematuria - nitrofurantoin, macrocrystal-monohydrate, (MACROBID) 100 MG capsule; Take 1 capsule (100 mg total) by mouth 2 (two) times daily.  Dispense: 10 capsule; Refill: 0  Candidal intertrigo - clotrimazole (LOTRIMIN) 1 % cream; Apply 1 Application topically 2 (two) times daily.  Dispense: 30 g; Refill: 0

## 2022-02-22 DIAGNOSIS — M545 Low back pain, unspecified: Secondary | ICD-10-CM | POA: Insufficient documentation

## 2022-02-22 DIAGNOSIS — R059 Cough, unspecified: Secondary | ICD-10-CM | POA: Insufficient documentation

## 2022-02-22 NOTE — Assessment & Plan Note (Signed)
Reports rash in folds of skin, cream previously prescribed has helped. Refilled - clotrimazole (LOTRIMIN) 1 % cream; Apply 1 Application topically 2 (two) times daily.  Dispense: 30 g; Refill: 0

## 2022-02-22 NOTE — Assessment & Plan Note (Addendum)
Patient is experiencing acute low back pain. Patient is overweight and has some chronic back pain. Recently exacerbated after being in hospital for stroke like episode.Treatment as below, follow up in person if not improving. She has PT/OT ordered from hospital stay.  - naproxen (NAPROSYN) 500 MG tablet; Take 1 tablet (500 mg total) by mouth 2 (two) times daily with a meal.  Dispense: 20 tablet; Refill: 0 - methocarbamol (ROBAXIN) 750 MG tablet; Take 2 tablets (1,500 mg total) by mouth 3 (three) times daily for 2 days, THEN 1 tablet (750 mg total) every 8 (eight) hours as needed for up to 6 days for muscle spasms.  Dispense: 30 tablet; Refill: 0

## 2022-02-22 NOTE — Assessment & Plan Note (Signed)
Patient is experiencing dysuria and believes she has a UTI. No fever or flank pain. This is a telephone visit will treat with antibiotics. Follow up if not improving for further evaluation.  - nitrofurantoin, macrocrystal-monohydrate, (MACROBID) 100 MG capsule; Take 1 capsule (100 mg total) by mouth 2 (two) times daily.  Dispense: 10 capsule; Refill: 0

## 2022-02-22 NOTE — Assessment & Plan Note (Signed)
Patient has had a cough since discharge at hospital. History of allergic rhinitis. No asthma . She is on lisinopril-hctz, but not had this side effect before.  No fever, sick contacts, swelling of tongue or lips , or chest pain. Will treat cough symptom, if not improving patient will follow up for in person evaluation.   -dextromethorphan 15 MG/5ML syrup; Take 10 mLs (30 mg total) by mouth 4 (four) times daily as needed for cough.  Dispense: 120 mL; Refill: 0

## 2022-03-02 ENCOUNTER — Ambulatory Visit (HOSPITAL_COMMUNITY): Payer: BLUE CROSS/BLUE SHIELD | Attending: Physical Therapy | Admitting: Physical Therapy

## 2022-03-02 ENCOUNTER — Encounter (HOSPITAL_COMMUNITY): Payer: BLUE CROSS/BLUE SHIELD | Admitting: Occupational Therapy

## 2022-03-08 ENCOUNTER — Encounter: Payer: Self-pay | Admitting: Family Medicine

## 2022-03-08 ENCOUNTER — Ambulatory Visit: Payer: BLUE CROSS/BLUE SHIELD | Admitting: Family Medicine

## 2022-03-08 VITALS — BP 151/88 | HR 97 | Resp 16 | Ht 70.0 in | Wt 392.0 lb

## 2022-03-08 DIAGNOSIS — M5440 Lumbago with sciatica, unspecified side: Secondary | ICD-10-CM

## 2022-03-08 DIAGNOSIS — R531 Weakness: Secondary | ICD-10-CM | POA: Diagnosis not present

## 2022-03-08 DIAGNOSIS — F410 Panic disorder [episodic paroxysmal anxiety] without agoraphobia: Secondary | ICD-10-CM

## 2022-03-08 DIAGNOSIS — I10 Essential (primary) hypertension: Secondary | ICD-10-CM

## 2022-03-08 DIAGNOSIS — G8929 Other chronic pain: Secondary | ICD-10-CM | POA: Diagnosis not present

## 2022-03-08 MED ORDER — OXYCODONE-ACETAMINOPHEN 10-325 MG PO TABS: 1.0000 | ORAL_TABLET | Freq: Three times a day (TID) | ORAL | 0 refills | Status: AC | PRN

## 2022-03-08 MED ORDER — TIZANIDINE HCL 4 MG PO TABS: 4.0000 mg | ORAL_TABLET | Freq: Four times a day (QID) | ORAL | 0 refills | Status: DC | PRN

## 2022-03-08 NOTE — Patient Instructions (Addendum)
I appreciate the opportunity to provide care to you today!    Follow up:  1 week for BP   Referrals today-  Pain management   Please pick up your medication at the pharmacy  Please take tizanidine 4mg  at bedtime due to the side effects of drowsiness  Please take your Percocet 10 mg only as needed for chronic lower back pain with sciatica A referral has been placed to pain management, please make sure to follow-up as indicated  We will reassess your blood pressure in 1 week I recommend low sodium diet with increased physical activities as tolerated     Please continue to a heart-healthy diet and increase your physical activities. Try to exercise for at least three times a week.      It was a pleasure to see you and I look forward to continuing to work together on your health and well-being. Please do not hesitate to call the office if you need care or have questions about your care.   Have a wonderful day and week. With Gratitude, MSN, FNP-BC

## 2022-03-08 NOTE — Progress Notes (Unsigned)
Established Patient Office Visit  Subjective:  Patient ID: Tracie Green, female    DOB: [birth year]  Age: 46 y.o. MRN: [id number]  CC:  Chief Complaint  Patient presents with   Follow-up    2 week follow up. Weakness in left side   Panic Attack    Has been having panic attacks since she has been home from the hospital. Has trouble sleeping     HPI Tracie Green is a 46 y.o. female with past medical history of essential hypertension, obesity, strokelike episodes presents for f/u of  chronic medical conditions.  Panic attacks: History of panic attacks in her 3s.  She reports that she has recently started having panic attacks again, and this started in the hospital when she was admitted for CVA on 01/22/2022.  She reports a feeling of claustrophobia, noting that her nerves are really bad.  She notes taking Xanax, which relieved her symptoms when she had panic attacks in her 40s..    Left-sided weakness: She reports that she has not started physical therapy, but her left-sided weakness has improved.  She reports that her son has been helping her at home to perform exercises to strengthen her left side.  She denies strokelike symptoms today.  Chronic low back pain with sciatica: She describes the pain as stabbing and radiating down her lower left extremity.  Pain is described as sharp and stabbing, and she rates her pain 8 out of 10.  She reports that she was followed up with pain management while in [place] and took oxycodone 10 mg as needed for pain.  Past Medical History:  Diagnosis Date   Hypertension    PE (pulmonary embolism)     Past Surgical History:  Procedure Laterality Date   CESAREAN SECTION     CHOLECYSTECTOMY  1998   INCISION AND DRAINAGE      No family history on file.  Social History   Socioeconomic History   Marital status: Married    Spouse name: Not on file   Number of children: Not on file   Years of education: Not on file   Highest education level: Not on  file  Occupational History   Not on file  Tobacco Use   Smoking status: Never   Smokeless tobacco: Never  Substance and Sexual Activity   Alcohol use: No   Drug use: No   Sexual activity: Not on file  Other Topics Concern   Not on file  Social History Narrative   Not on file   Social Determinants of Health   Financial Resource Strain: Not on file  Food Insecurity: No Food Insecurity (01/26/2022)   Hunger Vital Sign    Worried About Running Out of Food in the Last Year: Never true    Ran Out of Food in the Last Year: Never true  Transportation Needs: No Transportation Needs (01/26/2022)   PRAPARE - Hydrologist (Medical): No    Lack of Transportation (Non-Medical): No  Physical Activity: Not on file  Stress: Not on file  Social Connections: Not on file  Intimate Partner Violence: Not At Risk (01/26/2022)   Humiliation, Afraid, Rape, and Kick questionnaire    Fear of Current or Ex-Partner: No    Emotionally Abused: No    Physically Abused: No    Sexually Abused: No    Outpatient Medications Prior to Visit  Medication Sig Dispense Refill   atorvastatin (LIPITOR) 40 MG tablet Take 1 tablet (40 mg total)  by mouth daily. 30 tablet 1   clotrimazole (LOTRIMIN) 1 % cream Apply 1 Application topically 2 (two) times daily. 30 g 0   dextromethorphan 15 MG/5ML syrup Take 10 mLs (30 mg total) by mouth 4 (four) times daily as needed for cough. 120 mL 0   dicyclomine (BENTYL) 20 MG tablet Take 1 tablet (20 mg total) by mouth 3 (three) times daily as needed for up to 21 doses for spasms. 21 tablet 0   famotidine (PEPCID) 20 MG tablet Take 1 tablet (20 mg total) by mouth daily for 30 doses. (Patient not taking: Reported on 01/23/2022) 30 tablet 0   fluticasone (FLONASE) 50 MCG/ACT nasal spray SPRAY 2 SPRAYS INTO EACH NOSTRIL EVERY DAY 48 mL 1   levocetirizine (XYZAL) 5 MG tablet TAKE 1 TABLET BY MOUTH EVERY DAY IN THE EVENING 90 tablet 1    lisinopril-hydrochlorothiazide (ZESTORETIC) 20-25 MG tablet Take 1 tablet by mouth daily. 90 tablet 1   naproxen (NAPROSYN) 500 MG tablet Take 1 tablet (500 mg total) by mouth 2 (two) times daily with a meal. 20 tablet 0   ondansetron (ZOFRAN-ODT) 4 MG disintegrating tablet Take 1 tablet (4 mg total) by mouth every 8 (eight) hours as needed for up to 15 doses for nausea or vomiting. 15 tablet 0   pantoprazole (PROTONIX) 20 MG tablet TAKE 1 TABLET BY MOUTH EVERY DAY 90 tablet 1   PRESCRIPTION MEDICATION darvocet     PROBIOTIC PRODUCT PO Take 1 capsule by mouth daily.     topiramate (TOPAMAX) 100 MG tablet Take 1 tablet (100 mg total) by mouth 2 (two) times daily. 30 tablet 1   Aspirin-Acetaminophen-Caffeine (EXCEDRIN PO) Take 1 tablet by mouth as needed (migraine).     methylPREDNISolone (MEDROL DOSEPAK) 4 MG TBPK tablet Janine Ores FNP- BC 2 tablet 0   methylPREDNISolone (MEDROL DOSEPAK) 4 MG TBPK tablet Janine Ores FNP- BC 2 tablet 0   nitrofurantoin, macrocrystal-monohydrate, (MACROBID) 100 MG capsule Take 1 capsule (100 mg total) by mouth 2 (two) times daily. 10 capsule 0   No facility-administered medications prior to visit.    Allergies  Allergen Reactions   Penicillins Hives and Rash    Hives   Morphine Nausea And Vomiting   Morphine And Related Nausea And Vomiting    ROS Review of Systems  Constitutional:  Negative for chills and fever.  Eyes:  Negative for visual disturbance.  Respiratory:  Negative for chest tightness and shortness of breath.   Musculoskeletal:  Positive for back pain.       Left-sided weakness  Neurological:  Negative for dizziness and headaches.      Objective:    Physical Exam HENT:     Head: Normocephalic.     Mouth/Throat:     Mouth: Mucous membranes are moist.  Cardiovascular:     Rate and Rhythm: Normal rate.     Heart sounds: Normal heart sounds.  Pulmonary:     Effort: Pulmonary effort is normal.     Breath sounds: Normal breath  sounds.  Musculoskeletal:     Comments: Tenderness to palpation of the lumbar musculature No overlying skin changes No CVA tenderness +3 muscle strength of the upper and lower extremity on the left side +2 deep tendon reflex of the left patellar deep tendon  Neurological:     Mental Status: She is alert and oriented to person, place, and time.     BP (!) 151/88   Pulse 97   Resp 16  Ht _0  (1.778 m)   Wt (!) 392 lb (177.8 kg)   SpO2 98%   BMI 56.25 kg/m  Wt Readings from Last 3 Encounters:  03/08/22 (!) 392 lb (177.8 kg)  01/25/22 (!) 385 lb 12.9 oz (175 kg)  12/23/21 (!) 387 lb 1.3 oz (175.6 kg)    Lab Results  Component Value Date   TSH 0.879 12/26/2021   Lab Results  Component Value Date   WBC 12.0 (H) 01/22/2022   HGB 12.7 01/22/2022   HCT 39.2 01/22/2022   MCV 84.7 01/22/2022   PLT 324 01/22/2022   Lab Results  Component Value Date   NA 135 01/24/2022   K 4.0 01/24/2022   CO2 24 01/24/2022   GLUCOSE 91 01/24/2022   BUN 12 01/24/2022   CREATININE 1.25 (H) 01/24/2022   BILITOT 0.3 01/22/2022   ALKPHOS 97 01/22/2022   AST 14 (L) 01/22/2022   ALT 11 01/22/2022   PROT 7.5 01/22/2022   ALBUMIN 4.0 01/22/2022   CALCIUM 8.9 01/24/2022   ANIONGAP 8 01/24/2022   EGFR 66 12/26/2021   Lab Results  Component Value Date   CHOL 127 01/23/2022   Lab Results  Component Value Date   HDL 46 01/23/2022   Lab Results  Component Value Date   LDLCALC 71 01/23/2022   Lab Results  Component Value Date   TRIG 52 01/23/2022   Lab Results  Component Value Date   CHOLHDL 2.8 01/23/2022   Lab Results  Component Value Date   HGBA1C 5.1 12/26/2021      Assessment & Plan:  Essential hypertension Assessment & Plan: Uncontrolled She takes lisinopril-hydrochlorothiazide 20-25 She reports compliance with treatment regimen Encouraged low-sodium diet with increased physical activities We will follow-up on BP in 1 week BP Readings from Last 3 Encounters:   03/08/22 (!) 151/88  01/26/22 100/69  01/19/22 120/66      Chronic midline low back pain with sciatica, sciatica laterality unspecified Assessment & Plan: Chronic She reports better relief of her symptoms with tizanidine in 4 mg every 4 hours as needed Will provide a short supply of narcotics Referral placed to pain management Encouraged heat application to the lower back as needed for pain relief    Other chronic pain -     Ambulatory referral to Pain Clinic -     tiZANidine HCl; Take 1 tablet (4 mg total) by mouth every 6 (six) hours as needed for muscle spasms.  Dispense: 30 tablet; Refill: 0 -     oxyCODONE-Acetaminophen; Take 1 tablet by mouth every 8 (eight) hours as needed for up to 5 days for pain.  Dispense: 15 tablet; Refill: 0  Left-sided weakness Assessment & Plan: Left-sided weakness has improved since her ED visit on 01/22/2022 Encouraged to continue physical therapy with the help of her son    Panic attacks Assessment & Plan: History of panic attacks since her 19s Will start patient today on Zoloft 25 mg daily for panic attacks    Orders: -     Sertraline HCl; Take 1 tablet (25 mg total) by mouth daily.  Dispense: 30 tablet; Refill: 3    Follow-up: Return in about 1 week (around 03/15/2022).   Alvira Monday, FNP

## 2022-03-09 DIAGNOSIS — F410 Panic disorder [episodic paroxysmal anxiety] without agoraphobia: Secondary | ICD-10-CM | POA: Insufficient documentation

## 2022-03-09 DIAGNOSIS — R531 Weakness: Secondary | ICD-10-CM | POA: Insufficient documentation

## 2022-03-09 MED ORDER — SERTRALINE HCL 25 MG PO TABS: 25.0000 mg | ORAL_TABLET | Freq: Every day | ORAL | 3 refills | Status: DC

## 2022-03-09 NOTE — Assessment & Plan Note (Signed)
Uncontrolled She takes lisinopril-hydrochlorothiazide 20-25 She reports compliance with treatment regimen Encouraged low-sodium diet with increased physical activities We will follow-up on BP in 1 week BP Readings from Last 3 Encounters:  03/08/22 (!) 151/88  01/26/22 100/69  01/19/22 120/66

## 2022-03-09 NOTE — Assessment & Plan Note (Signed)
History of panic attacks since her 55s Will start patient today on Zoloft 25 mg daily for panic attacks

## 2022-03-09 NOTE — Assessment & Plan Note (Signed)
Chronic She reports better relief of her symptoms with tizanidine in 4 mg every 4 hours as needed Will provide a short supply of narcotics Referral placed to pain management Encouraged heat application to the lower back as needed for pain relief

## 2022-03-09 NOTE — Assessment & Plan Note (Signed)
Left-sided weakness has improved since her ED visit on 01/22/2022 Encouraged to continue physical therapy with the help of her son

## 2022-03-13 ENCOUNTER — Ambulatory Visit: Payer: Medicaid Other | Admitting: Family Medicine

## 2022-03-17 ENCOUNTER — Telehealth: Payer: Self-pay | Admitting: Family Medicine

## 2022-03-17 ENCOUNTER — Other Ambulatory Visit: Payer: Self-pay

## 2022-03-17 ENCOUNTER — Other Ambulatory Visit: Payer: Self-pay | Admitting: Family Medicine

## 2022-03-17 ENCOUNTER — Ambulatory Visit: Payer: Medicaid Other | Admitting: Family Medicine

## 2022-03-17 DIAGNOSIS — G8929 Other chronic pain: Secondary | ICD-10-CM

## 2022-03-17 DIAGNOSIS — M545 Low back pain, unspecified: Secondary | ICD-10-CM

## 2022-03-17 MED ORDER — NAPROXEN 500 MG PO TABS: 500.0000 mg | ORAL_TABLET | Freq: Two times a day (BID) | ORAL | 0 refills | Status: DC

## 2022-03-17 MED ORDER — OXYCODONE-ACETAMINOPHEN 5-325 MG PO TABS: 1.0000 | ORAL_TABLET | ORAL | 0 refills | Status: AC | PRN

## 2022-03-17 NOTE — Telephone Encounter (Signed)
yes

## 2022-03-17 NOTE — Telephone Encounter (Signed)
Pt's husband has been informed. °

## 2022-03-17 NOTE — Telephone Encounter (Signed)
Rx sent 

## 2022-03-17 NOTE — Telephone Encounter (Signed)
Patient is requesting refill on naproxen (NAPROSYN) 500 MG tablet    Has not heard back from pain clinic yet. Wants a call back.

## 2022-03-17 NOTE — Telephone Encounter (Signed)
Refill sent.

## 2022-03-17 NOTE — Telephone Encounter (Signed)
Spoke to pt states she is needing refill on pain medication not naproxen, said she hasn't received a call from pain management yet, provided her with phone number so she can reach out to them., please advice on medication?

## 2022-03-17 NOTE — Telephone Encounter (Signed)
Ok to refill 

## 2022-03-20 ENCOUNTER — Encounter: Payer: Self-pay | Admitting: Internal Medicine

## 2022-03-20 ENCOUNTER — Ambulatory Visit (INDEPENDENT_AMBULATORY_CARE_PROVIDER_SITE_OTHER): Payer: BLUE CROSS/BLUE SHIELD | Admitting: Internal Medicine

## 2022-03-20 VITALS — BP 132/90 | HR 100 | Ht 70.0 in | Wt 385.0 lb

## 2022-03-20 DIAGNOSIS — I10 Essential (primary) hypertension: Secondary | ICD-10-CM | POA: Diagnosis not present

## 2022-03-20 DIAGNOSIS — G4730 Sleep apnea, unspecified: Secondary | ICD-10-CM | POA: Diagnosis not present

## 2022-03-20 DIAGNOSIS — R531 Weakness: Secondary | ICD-10-CM | POA: Diagnosis not present

## 2022-03-20 NOTE — Assessment & Plan Note (Signed)
Patient is using single leg cane since stroke like episode and left sided weakness.  BMI 55, 385 lbs and single cane is not giving good support. Orders placed for 4 point cane.

## 2022-03-20 NOTE — Progress Notes (Unsigned)
     CC: Hypertension (Patient is here for recheck of BP. )    HPI:[name] is a 46 y.o. female who presents for evaluation of hypertension. For the details of today's visit, please refer to the assessment and plan.   Past Medical History:  Diagnosis Date   Hypertension    PE (pulmonary embolism)      Physical Exam: Vitals:   03/20/22 1414  BP: (!) 139/90  Pulse: 100  SpO2: 98%  Weight: (!) 385 lb (174.6 kg)  Height: 5\' 10"  (1.778 m)     Physical Exam Constitutional:      Appearance: Normal appearance. She is well-groomed. She is morbidly obese.  Cardiovascular:     Rate and Rhythm: Normal rate and regular rhythm.  Pulmonary:     Effort: Pulmonary effort is normal.     Breath sounds: Normal breath sounds.  Neurological:     Mental Status: She is alert.      Assessment & Plan:   Essential hypertension Patient her for uncontrolled hypertension at her last visit. She was in acute pain at last visit and scheduled for follow up for monitoring. The patient endorses adherence to Lisinopril-HCTZ 20-25. No lightheaded feeling or dizziness.    Chronic problem, controlled on current regiment. BMP checked in October. Patient stop bang of 6, high risk for moderate to severe OSA. - Continue Lisinopril-HCTZ 20-25 - Sleep Study   Left-sided weakness Patient is using single leg cane since stroke like episode and left sided weakness.  BMI 55, 385 lbs and single cane is not giving good support. Orders placed for 4 point cane.     November, MD

## 2022-03-20 NOTE — Assessment & Plan Note (Addendum)
Patient her for uncontrolled hypertension at her last visit. She was in acute pain at last visit and scheduled for follow up for monitoring. The patient endorses adherence to Lisinopril-HCTZ 20-25. No lightheaded feeling or dizziness.    Chronic problem, controlled on current regiment. BMP checked in October. Patient stop bang of 6, high risk for moderate to severe OSA. - Continue Lisinopril-HCTZ 20-25 - Sleep Study

## 2022-03-20 NOTE — Patient Instructions (Addendum)
Thank you for trusting me with your care. To recap, today we discussed the following:   BP Readings from Last 3 Encounters:  03/20/22 (!) 139/90  03/08/22 (!) 151/88  01/26/22 100/69   Continue current medications  Order for sleep study placed  Orders for Cane placed

## 2022-03-28 ENCOUNTER — Telehealth: Payer: Self-pay | Admitting: Family Medicine

## 2022-03-28 NOTE — Telephone Encounter (Signed)
Pt called stating that pain mgt just received her referral & can't see her for a few more weeks. She is wanting to know can you please refill her pain med one more time? Per pt she states pain mgt said that we have to fill this till her can see her because she can't stop the medication.   Prescription Request  03/28/2022  Is this a "Controlled Substance" medicine? Yes  LOV: 03/08/2022  What is the name of the medication or equipment?  oxyCODONE-acetaminophen (PERCOCET) 10-325 MG tablet   pantoprazole (PROTONIX) 20 MG tablet   Have you contacted your pharmacy to request a refill? No   Which pharmacy would you like this sent to?  [facility] [address] [place] - [address] [place] [address] Phone: [phone] Fax: [phone]  [id number] [place] [place] - [address] AT [facility] [address] [place] [address] Phone: [phone] Fax: [phone]  [id number] [place], [place] - [address] [place] [address] Phone: [phone] Fax: [phone]    Patient notified that their request is being sent to the clinical staff for review and that they should receive a response within 2 business days.   Please advise at Mobile [phone] (mobile)

## 2022-03-28 NOTE — Telephone Encounter (Signed)
Kindly refill protonix 20 mg

## 2022-03-29 ENCOUNTER — Other Ambulatory Visit: Payer: Self-pay

## 2022-03-29 DIAGNOSIS — K219 Gastro-esophageal reflux disease without esophagitis: Secondary | ICD-10-CM

## 2022-03-29 MED ORDER — PANTOPRAZOLE SODIUM 20 MG PO TBEC: 20.0000 mg | DELAYED_RELEASE_TABLET | Freq: Every day | ORAL | 1 refills | Status: DC

## 2022-03-29 NOTE — Telephone Encounter (Signed)
Refill sent for protonix

## 2022-04-04 ENCOUNTER — Telehealth: Payer: Self-pay | Admitting: Family Medicine

## 2022-04-04 NOTE — Telephone Encounter (Signed)
Separate TE forwarded to provider .

## 2022-04-04 NOTE — Telephone Encounter (Signed)
Patient called in requesting refill on  oxyCODONE-acetaminophen (PERCOCET) 10-325 MG tablet  Cannot get into pain management clinic   Also needs refill on   tiZANidine (ZANAFLEX) 4 MG tablet    Patient also states that insurance does not cover   pantoprazole (PROTONIX) 20 MG tablet      Patient wants a call back

## 2022-04-04 NOTE — Telephone Encounter (Signed)
Pt called in regards to her medication. Can you please call her when available?

## 2022-04-05 ENCOUNTER — Other Ambulatory Visit: Payer: Self-pay | Admitting: Family Medicine

## 2022-04-05 ENCOUNTER — Telehealth: Payer: Self-pay | Admitting: Family Medicine

## 2022-04-05 ENCOUNTER — Other Ambulatory Visit: Payer: Self-pay

## 2022-04-05 DIAGNOSIS — G8929 Other chronic pain: Secondary | ICD-10-CM

## 2022-04-05 MED ORDER — TIZANIDINE HCL 4 MG PO TABS: 4.0000 mg | ORAL_TABLET | Freq: Four times a day (QID) | ORAL | 0 refills | Status: DC | PRN

## 2022-04-05 MED ORDER — OXYCODONE-ACETAMINOPHEN 5-325 MG PO TABS: 1.0000 | ORAL_TABLET | ORAL | 0 refills | Status: AC | PRN

## 2022-04-05 NOTE — Telephone Encounter (Signed)
Prescription Request  04/05/2022  Is this a "Controlled Substance" medicine? No  LOV: 03/08/2022  What is the name of the medication or equipment? pantoprazole (PROTONIX) 20 MG tablet [[id number]]  PHARMACY WILL NOT COVER UNDER YOUR INSURANCE CAN SOMETHING ELSE BE CALLED INTO PHARMACY.    tiZANidine (ZANAFLEX) 4 MG tablet [[id number]]    Have you contacted your pharmacy to request a refill? Yes   Which [facility] you [facility]? [facility]   Patient notified that their request is being sent to the clinical staff for review and that they should receive a response within 2 business days.   Please advise at Mobile [phone] (mobile)

## 2022-04-05 NOTE — Telephone Encounter (Signed)
Rx sent for pain medicine

## 2022-04-05 NOTE — Telephone Encounter (Signed)
error 

## 2022-04-05 NOTE — Telephone Encounter (Signed)
Refill sent.

## 2022-04-05 NOTE — Telephone Encounter (Signed)
Pt informed

## 2022-04-08 ENCOUNTER — Other Ambulatory Visit: Payer: Self-pay | Admitting: Family Medicine

## 2022-04-08 DIAGNOSIS — F410 Panic disorder [episodic paroxysmal anxiety] without agoraphobia: Secondary | ICD-10-CM

## 2022-04-10 NOTE — Telephone Encounter (Signed)
Yes and thank you

## 2022-04-11 ENCOUNTER — Telehealth: Payer: Self-pay | Admitting: Family Medicine

## 2022-04-11 NOTE — Telephone Encounter (Signed)
Patient called in regard to pain management referral . Office does not prescribe patient pain medicine.  Patient would like new referral to [facility] in [place] if possible.  Also needs refill on   oxyCODONE-acetaminophen (PERCOCET) 10-325 MG tablet    Patient wants a call back

## 2022-04-12 ENCOUNTER — Telehealth: Payer: Self-pay | Admitting: Family Medicine

## 2022-04-12 ENCOUNTER — Other Ambulatory Visit: Payer: Self-pay | Admitting: Family Medicine

## 2022-04-12 ENCOUNTER — Other Ambulatory Visit: Payer: Self-pay

## 2022-04-12 DIAGNOSIS — G8929 Other chronic pain: Secondary | ICD-10-CM

## 2022-04-12 MED ORDER — OXYCODONE-ACETAMINOPHEN 5-325 MG PO TABS: 1.0000 | ORAL_TABLET | ORAL | 0 refills | Status: AC | PRN

## 2022-04-12 NOTE — Telephone Encounter (Signed)
Pt called stating that clinic she was referred to in [place] does not do pain management & needs to be referred elsewhere. Also needs refill on pain med??

## 2022-04-12 NOTE — Telephone Encounter (Signed)
Referral placed to [facility] in [place]

## 2022-04-12 NOTE — Telephone Encounter (Signed)
Rx sent 

## 2022-04-12 NOTE — Telephone Encounter (Signed)
Left vm informing pt of rx sent to pharmacy.

## 2022-04-12 NOTE — Telephone Encounter (Signed)
Please advice  

## 2022-04-12 NOTE — Telephone Encounter (Signed)
Kindly place a referral to [facility] in [place]. Dr. Albertina Parr

## 2022-04-18 ENCOUNTER — Telehealth: Payer: Self-pay | Admitting: Family Medicine

## 2022-04-18 NOTE — Telephone Encounter (Signed)
Patient called said needs for nurse to give her a call about pain medicine. Has an appointment with pain management not til April 2024 but needs a sooner appointment elsewhere.

## 2022-04-18 NOTE — Telephone Encounter (Signed)
Pt states the earliest appt they could give her was in April, states she is out of her pain medication needs refills and wants to know how to go about her refills until appt

## 2022-04-19 ENCOUNTER — Telehealth: Payer: Self-pay | Admitting: Family Medicine

## 2022-04-19 NOTE — Telephone Encounter (Signed)
Pt returned call

## 2022-04-20 ENCOUNTER — Other Ambulatory Visit: Payer: Self-pay | Admitting: Family Medicine

## 2022-04-20 ENCOUNTER — Other Ambulatory Visit: Payer: Self-pay

## 2022-04-20 DIAGNOSIS — G8929 Other chronic pain: Secondary | ICD-10-CM

## 2022-04-20 MED ORDER — OXYCODONE-ACETAMINOPHEN 5-325 MG PO TABS: 1.0000 | ORAL_TABLET | Freq: Four times a day (QID) | ORAL | 0 refills | Status: DC | PRN

## 2022-04-20 NOTE — Telephone Encounter (Signed)
Rx sent 

## 2022-04-20 NOTE — Telephone Encounter (Signed)
Pt asking if you can send her an extended prescription for a week or a month she is having to pay copays everytime she picks up the rx, said if needed she can come and do urine drug test.

## 2022-04-24 NOTE — Telephone Encounter (Signed)
Pt informed will come by tomorrow morning.

## 2022-04-24 NOTE — Telephone Encounter (Signed)
Please asked the patient to come for urine drug screen before her next refill

## 2022-04-25 ENCOUNTER — Other Ambulatory Visit: Payer: Self-pay

## 2022-04-25 DIAGNOSIS — Z0289 Encounter for other administrative examinations: Secondary | ICD-10-CM

## 2022-04-25 DIAGNOSIS — G8929 Other chronic pain: Secondary | ICD-10-CM

## 2022-04-25 NOTE — Progress Notes (Signed)
  Current Outpatient Medications:    atorvastatin (LIPITOR) 40 MG tablet, Take 1 tablet (40 mg total) by mouth daily., Disp: 30 tablet, Rfl: 1   clotrimazole (LOTRIMIN) 1 % cream, Apply 1 Application topically 2 (two) times daily., Disp: 30 g, Rfl: 0   dextromethorphan 15 MG/5ML syrup, Take 10 mLs (30 mg total) by mouth 4 (four) times daily as needed for cough., Disp: 120 mL, Rfl: 0   dicyclomine (BENTYL) 20 MG tablet, Take 1 tablet (20 mg total) by mouth 3 (three) times daily as needed for up to 21 doses for spasms., Disp: 21 tablet, Rfl: 0   famotidine (PEPCID) 20 MG tablet, Take 1 tablet (20 mg total) by mouth daily for 30 doses. (Patient not taking: Reported on 01/23/2022), Disp: 30 tablet, Rfl: 0   fluticasone (FLONASE) 50 MCG/ACT nasal spray, SPRAY 2 SPRAYS INTO EACH NOSTRIL EVERY DAY, Disp: 48 mL, Rfl: 1   levocetirizine (XYZAL) 5 MG tablet, TAKE 1 TABLET BY MOUTH EVERY DAY IN THE EVENING, Disp: 90 tablet, Rfl: 1   lisinopril-hydrochlorothiazide (ZESTORETIC) 20-25 MG tablet, Take 1 tablet by mouth daily., Disp: 90 tablet, Rfl: 1   naproxen (NAPROSYN) 500 MG tablet, Take 1 tablet (500 mg total) by mouth 2 (two) times daily with a meal., Disp: 20 tablet, Rfl: 0   ondansetron (ZOFRAN-ODT) 4 MG disintegrating tablet, Take 1 tablet (4 mg total) by mouth every 8 (eight) hours as needed for up to 15 doses for nausea or vomiting., Disp: 15 tablet, Rfl: 0   oxyCODONE-acetaminophen (PERCOCET/ROXICET) 5-325 MG tablet, Take 1 tablet by mouth every 6 (six) hours as needed for severe pain., Disp: 20 tablet, Rfl: 0   pantoprazole (PROTONIX) 20 MG tablet, Take 1 tablet (20 mg total) by mouth daily., Disp: 90 tablet, Rfl: 1   PRESCRIPTION MEDICATION, darvocet, Disp: , Rfl:    PROBIOTIC PRODUCT PO, Take 1 capsule by mouth daily., Disp: , Rfl:    sertraline (ZOLOFT) 25 MG tablet, TAKE 1 TABLET (25 MG TOTAL) BY MOUTH DAILY., Disp: 90 tablet, Rfl: 2   tiZANidine (ZANAFLEX) 4 MG tablet, Take 1 tablet (4 mg total)  by mouth every 6 (six) hours as needed for muscle spasms., Disp: 30 tablet, Rfl: 0   topiramate (TOPAMAX) 100 MG tablet, Take 1 tablet (100 mg total) by mouth 2 (two) times daily., Disp: 30 tablet, Rfl: 1

## 2022-04-26 ENCOUNTER — Telehealth: Payer: Self-pay | Admitting: Family Medicine

## 2022-04-26 NOTE — Telephone Encounter (Signed)
Spoke to pt let him know results are not back yet, will call him as soon as results are reviewed by provider.

## 2022-04-26 NOTE — Telephone Encounter (Signed)
Pt called wanting to speak to nurse about drug test & med refill

## 2022-04-27 ENCOUNTER — Telehealth: Payer: Self-pay | Admitting: Family Medicine

## 2022-04-27 NOTE — Telephone Encounter (Signed)
Patient needs refills  oxyCODONE-acetaminophen (PERCOCET/ROXICET) 5-325 MG tablet [[id number]]   Pharmacy  [id number] [place], [place] - [address], [place] [address] Phone: [phone]  Fax: [phone]

## 2022-04-27 NOTE — Telephone Encounter (Signed)
Uds not back yet, pt has been made aware that I will call with results.

## 2022-04-28 ENCOUNTER — Telehealth: Payer: Self-pay | Admitting: Family Medicine

## 2022-04-28 LAB — TOXASSURE SELECT 13 (MW), URINE

## 2022-04-28 NOTE — Telephone Encounter (Signed)
Pt called back in regards to getting a refill on pain med (please see other messages)

## 2022-05-01 ENCOUNTER — Other Ambulatory Visit: Payer: Self-pay | Admitting: Internal Medicine

## 2022-05-01 DIAGNOSIS — G8929 Other chronic pain: Secondary | ICD-10-CM

## 2022-05-01 MED ORDER — OXYCODONE-ACETAMINOPHEN 5-325 MG PO TABS: 1.0000 | ORAL_TABLET | Freq: Four times a day (QID) | ORAL | 0 refills | Status: DC | PRN

## 2022-05-01 NOTE — Telephone Encounter (Signed)
Please advice on pain medication refill since Tracie Green is out?

## 2022-05-01 NOTE — Telephone Encounter (Signed)
Spoke to husband let him know.

## 2022-05-04 ENCOUNTER — Other Ambulatory Visit: Payer: Self-pay | Admitting: Family Medicine

## 2022-05-04 ENCOUNTER — Telehealth: Payer: Self-pay | Admitting: Family Medicine

## 2022-05-04 DIAGNOSIS — G8929 Other chronic pain: Secondary | ICD-10-CM

## 2022-05-04 DIAGNOSIS — J3089 Other allergic rhinitis: Secondary | ICD-10-CM

## 2022-05-04 MED ORDER — LEVOCETIRIZINE DIHYDROCHLORIDE 5 MG PO TABS: 5.0000 mg | ORAL_TABLET | Freq: Every evening | ORAL | 1 refills | Status: DC

## 2022-05-04 MED ORDER — TIZANIDINE HCL 4 MG PO TABS: 4.0000 mg | ORAL_TABLET | Freq: Four times a day (QID) | ORAL | 0 refills | Status: DC | PRN

## 2022-05-04 NOTE — Telephone Encounter (Signed)
Rx sent 

## 2022-05-04 NOTE — Telephone Encounter (Signed)
MED REFILL  tiZANidine (ZANAFLEX) 4 MG tablet [[id number]]   oxyCODONE-acetaminophen (PERCOCET/ROXICET) 5-325 MG tablet [[id number]]   levocetirizine (XYZAL) 5 MG tablet [[id number]]   [facility]Y: [facility]

## 2022-05-04 NOTE — Telephone Encounter (Signed)
error 

## 2022-05-05 ENCOUNTER — Telehealth: Payer: Self-pay | Admitting: Family Medicine

## 2022-05-05 ENCOUNTER — Other Ambulatory Visit: Payer: Self-pay | Admitting: Family Medicine

## 2022-05-05 ENCOUNTER — Other Ambulatory Visit: Payer: Self-pay

## 2022-05-05 DIAGNOSIS — G8929 Other chronic pain: Secondary | ICD-10-CM

## 2022-05-05 MED ORDER — OXYCODONE-ACETAMINOPHEN 5-325 MG PO TABS: 1.0000 | ORAL_TABLET | Freq: Four times a day (QID) | ORAL | 0 refills | Status: DC | PRN

## 2022-05-05 NOTE — Telephone Encounter (Signed)
Patient advised.

## 2022-05-05 NOTE — Telephone Encounter (Signed)
Dr. Posey Pronto recently filled her meds on 05/01/22

## 2022-05-05 NOTE — Telephone Encounter (Signed)
error 

## 2022-05-05 NOTE — Telephone Encounter (Signed)
Pt is due for her pain medication, she needs a refill.

## 2022-05-05 NOTE — Telephone Encounter (Signed)
Rx sent 

## 2022-05-05 NOTE — Telephone Encounter (Signed)
She has finished the medication, prescribed 6 hours as needed he sent a quantity of 20, please advice?

## 2022-05-05 NOTE — Telephone Encounter (Signed)
Pt informed

## 2022-05-05 NOTE — Telephone Encounter (Signed)
Patient called for Tracie Green and stated that she sent some meds in for her yesterday, but the Oxycodone 5-325 was not sent in.  She would like that called in to [facility]

## 2022-05-08 ENCOUNTER — Other Ambulatory Visit: Payer: Self-pay | Admitting: Family Medicine

## 2022-05-08 NOTE — Telephone Encounter (Signed)
40 tablets were sent

## 2022-05-08 NOTE — Telephone Encounter (Signed)
Spoke to the pharmacy, they are telling me that insurance only covers 5 day supply, she would need to call her insurance to check with them what is needed, let the pt know what is going on she is going to give a call back if anything is needed from our end.

## 2022-05-08 NOTE — Telephone Encounter (Signed)
Patient called and asking did the provider make a mistake on her pain medication only sent in 20 tabs instead of 40 tabs. Please return patient call at [phone] oxyCODONE-acetaminophen (PERCOCET/ROXICET) 5-325 MG tablet [[id number]

## 2022-05-09 ENCOUNTER — Encounter: Payer: Medicaid Other | Admitting: Pulmonary Disease

## 2022-05-10 ENCOUNTER — Telehealth: Payer: Self-pay | Admitting: Family Medicine

## 2022-05-10 NOTE — Telephone Encounter (Signed)
Unable to get through to anyone

## 2022-05-10 NOTE — Telephone Encounter (Signed)
Spoke to pt , states medicaid is needing an explanation to why she is going to 10 days now instead of 5 days. Pt is wanting to receive her pain medication monthly she says it would be easier for her because she is having to pay a copay everytime. Medicaid phone number pt gave is [phone].

## 2022-05-10 NOTE — Telephone Encounter (Signed)
Patient requesting a cll back in regard to medicaid and her med approval

## 2022-05-10 NOTE — Telephone Encounter (Signed)
Pt called wanting to know if nurse can please call her about sending in a form or something about her pain medication?

## 2022-05-11 ENCOUNTER — Telehealth: Payer: Self-pay | Admitting: Family Medicine

## 2022-05-11 NOTE — Telephone Encounter (Signed)
Let the pt know hold is longer than 15 min she needs to call and ask them to fax over a form to complete or if she gives me a direct number I can try to contact them again.

## 2022-05-11 NOTE — Telephone Encounter (Signed)
Patient called said the pharmacy told patient her provider will need to send in a new rx for her pain medicaiton  oxyCODONE-acetaminophen (PERCOCET/ROXICET) 5-325 MG tablet [[id number]]   Pharmacy  [id number] [address], [place], [place] [address] Phone: [phone]  Fax: [phone]

## 2022-05-11 NOTE — Telephone Encounter (Signed)
Pt called back at 3:51pm in regards to getting this medication refilled.

## 2022-05-11 NOTE — Telephone Encounter (Signed)
Spoke to husband this morning separate TE.

## 2022-05-12 ENCOUNTER — Telehealth: Payer: Self-pay | Admitting: Family Medicine

## 2022-05-12 ENCOUNTER — Other Ambulatory Visit: Payer: Self-pay | Admitting: Family Medicine

## 2022-05-12 DIAGNOSIS — G8929 Other chronic pain: Secondary | ICD-10-CM

## 2022-05-12 IMAGING — DX DG CHEST 2V
2 series · 2 of 2 positions shown · non-contrast
Comparison: 01/24/2021

CLINICAL DATA: Cough, shortness of breath

EXAM:
CHEST - 2 VIEW

[chest pa]
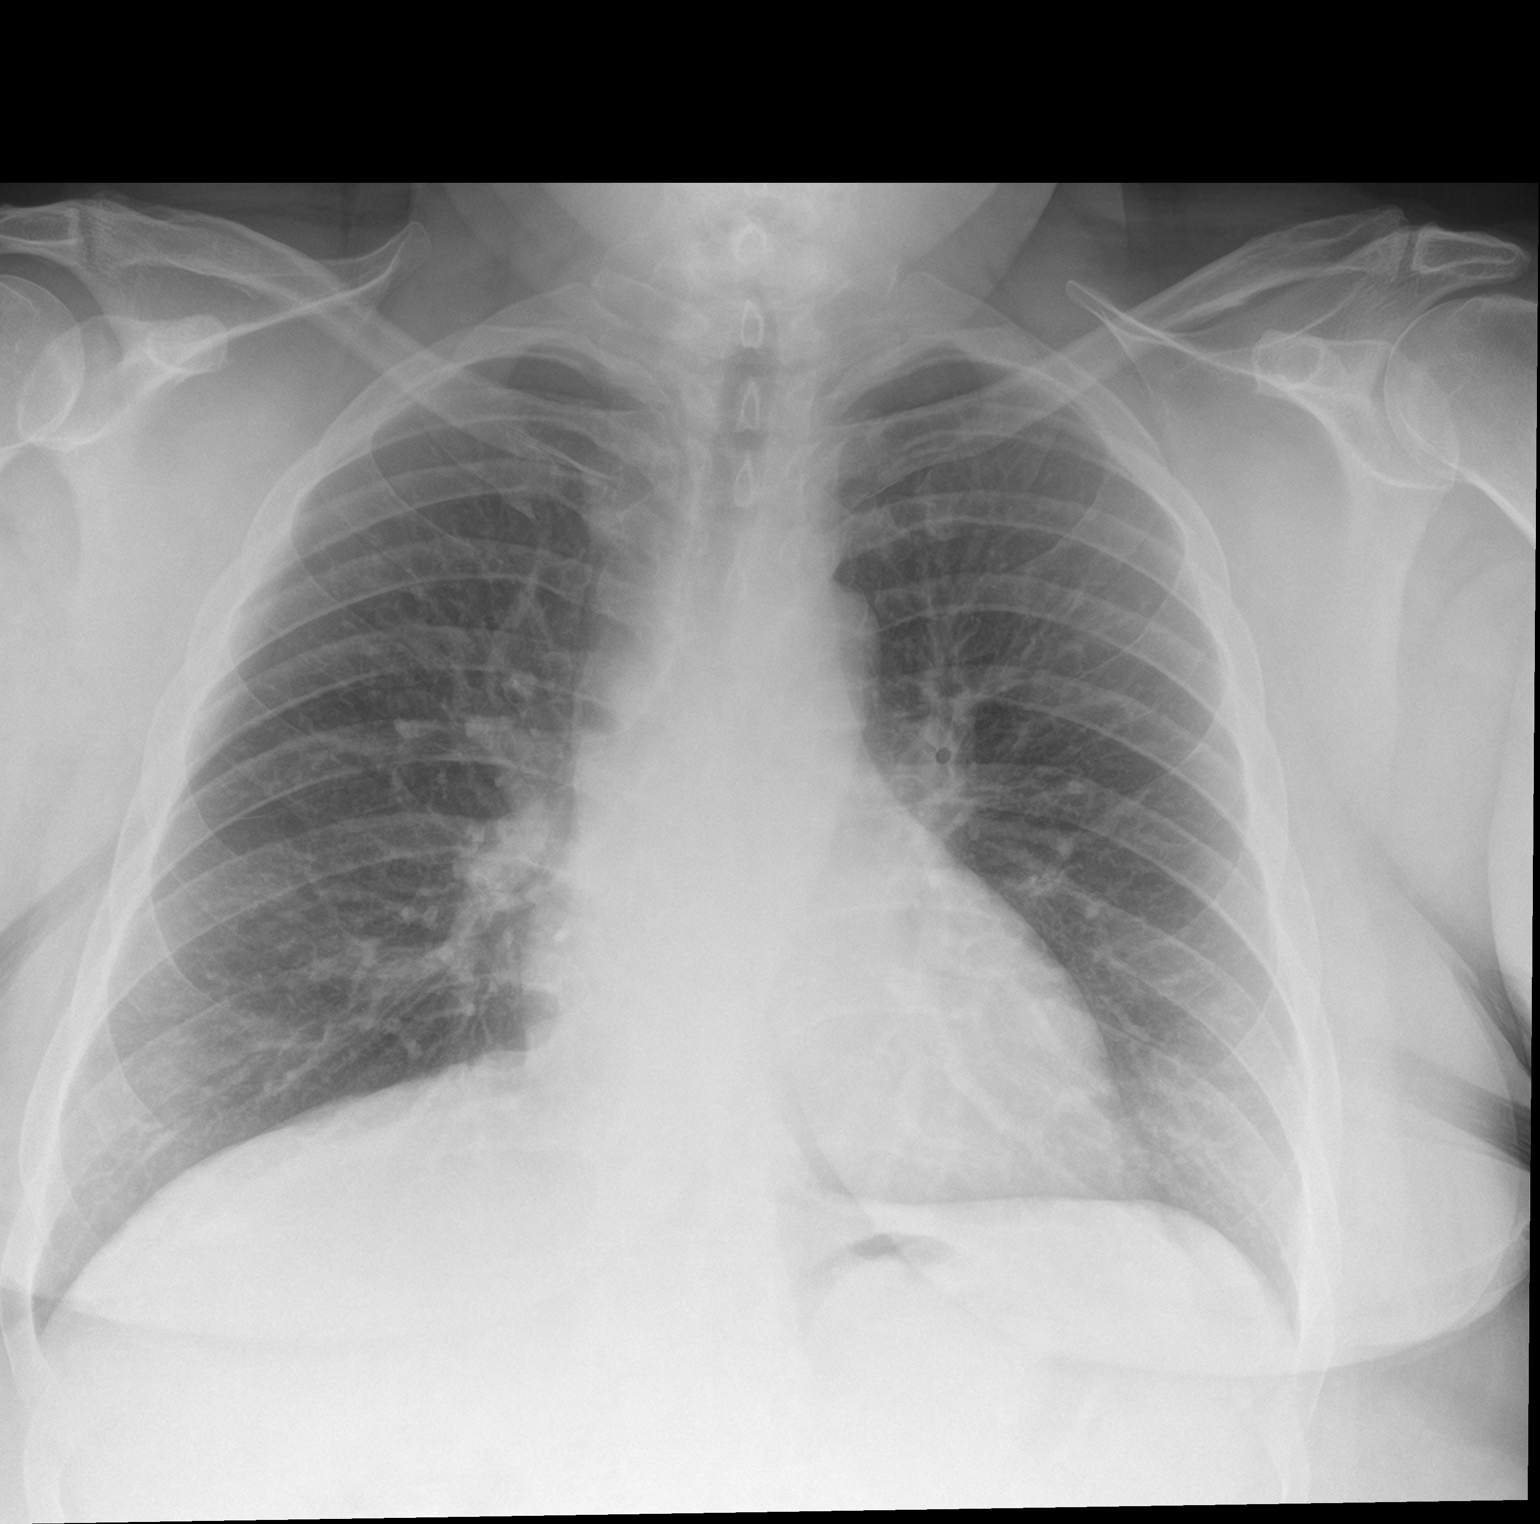

[chest lat]
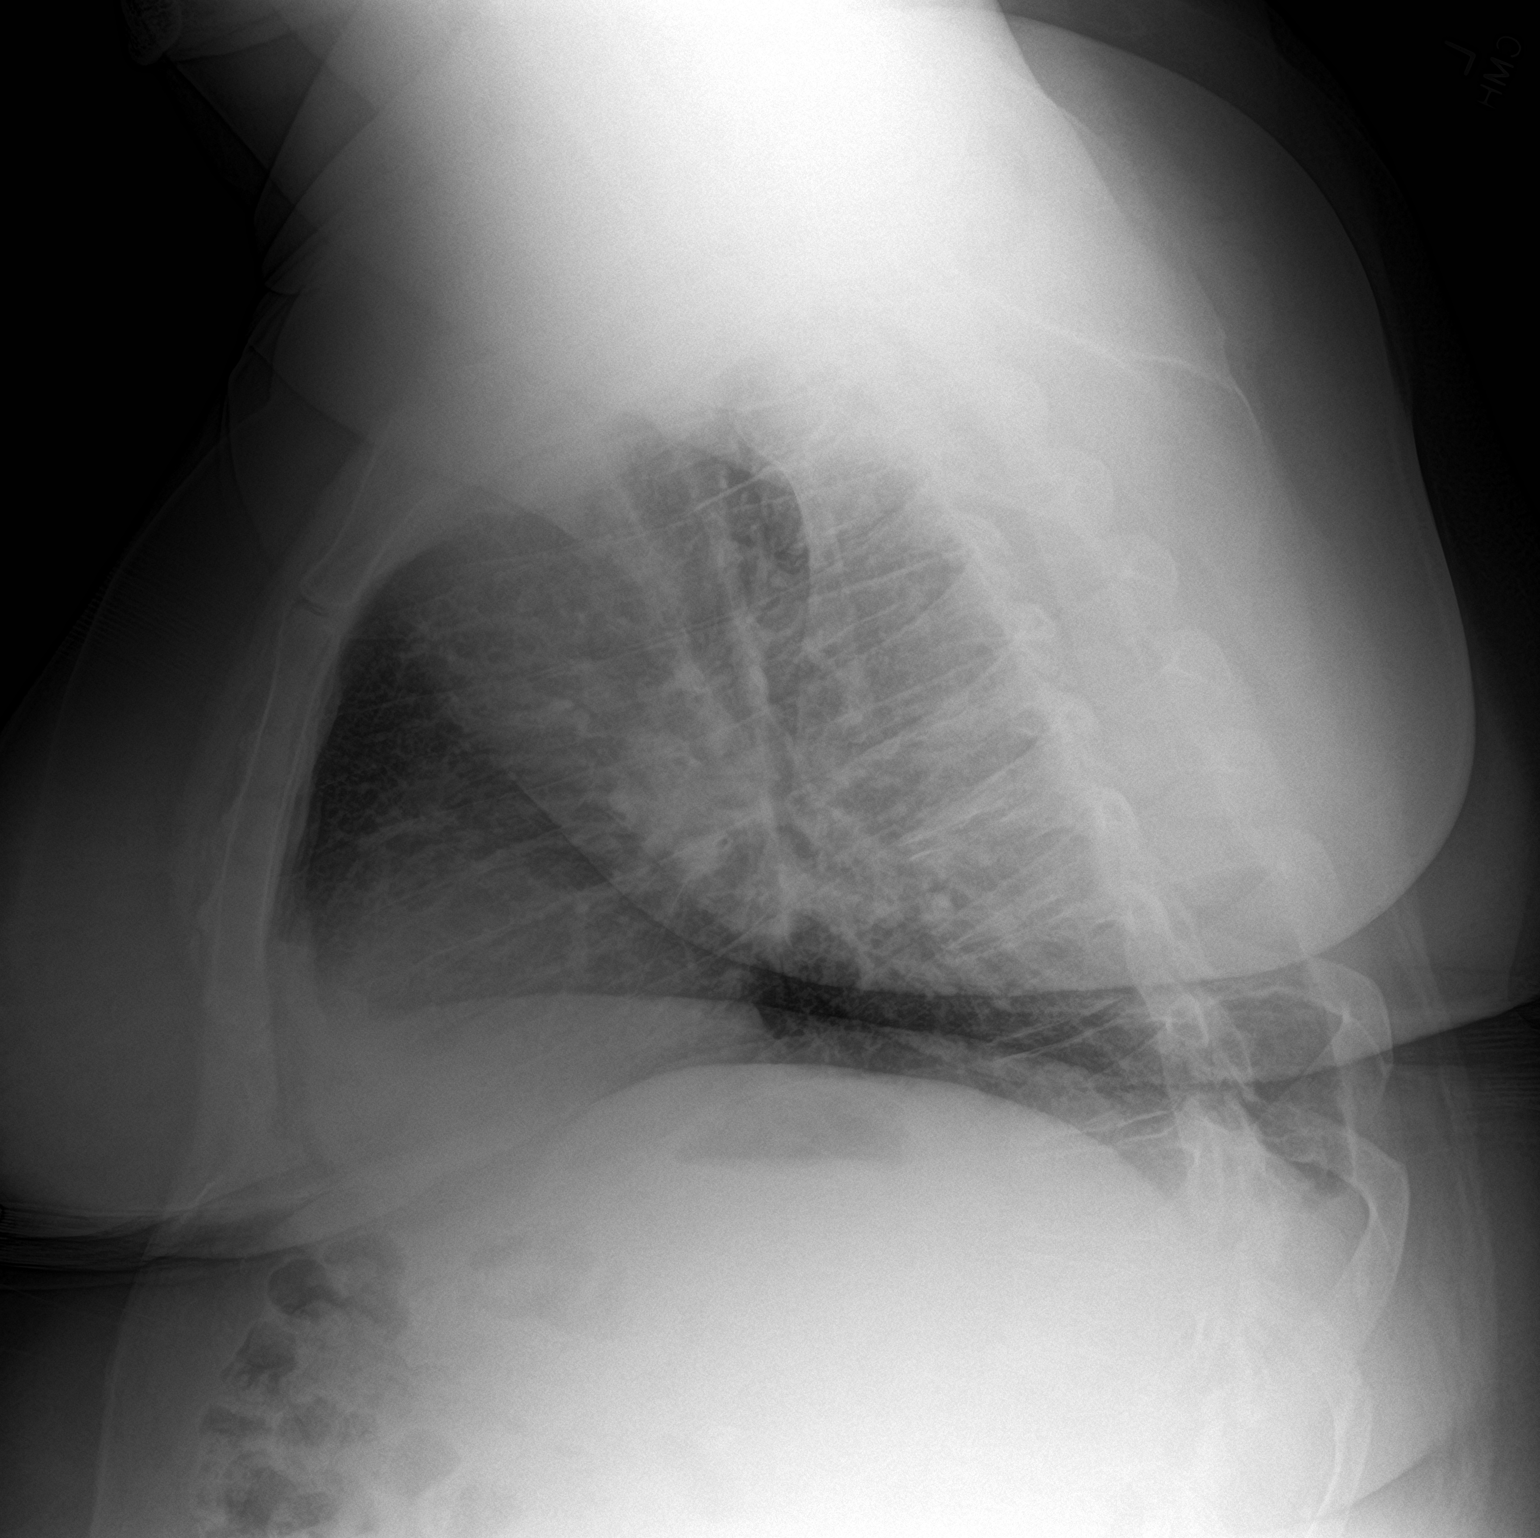

[2 of 2 positions shown; findings below may reference images not displayed]

FINDINGS: Cardiac and mediastinal contours are within normal limits. No focal
pulmonary opacity. No pleural effusion or pneumothorax. No acute
osseous abnormality.
IMPRESSION: No acute cardiopulmonary process.

## 2022-05-12 MED ORDER — OXYCODONE-ACETAMINOPHEN 5-325 MG PO TABS: 1.0000 | ORAL_TABLET | Freq: Four times a day (QID) | ORAL | 0 refills | Status: AC | PRN

## 2022-05-12 NOTE — Telephone Encounter (Signed)
Patient calling back said the provider in pain medication last Friday for 10 days and medicaid did not approve it only approved for 5 days, only got 5 days worth and pharmacy told patient needs a new prescription for the new prescription for 5 days.  [facility]  Patient asked for nurse or provider give her a call back today at [phone].

## 2022-05-12 NOTE — Telephone Encounter (Signed)
Sent msg to pt

## 2022-05-12 NOTE — Telephone Encounter (Signed)
Rx sent 

## 2022-05-12 NOTE — Telephone Encounter (Signed)
Pt called wanting to speak to nurse about medication

## 2022-05-12 NOTE — Telephone Encounter (Signed)
PA completed on oxycodone and watiing on results

## 2022-05-12 NOTE — Telephone Encounter (Signed)
States the pharmacy (due to stop act) will only dispense 5 days. Needs another 5 days called in. Patient has called several times

## 2022-05-16 ENCOUNTER — Ambulatory Visit: Payer: Medicaid Other | Attending: Internal Medicine | Admitting: Pulmonary Disease

## 2022-05-16 DIAGNOSIS — G4730 Sleep apnea, unspecified: Secondary | ICD-10-CM

## 2022-05-16 DIAGNOSIS — R0683 Snoring: Secondary | ICD-10-CM | POA: Insufficient documentation

## 2022-05-17 ENCOUNTER — Telehealth: Payer: Self-pay | Admitting: Family Medicine

## 2022-05-17 NOTE — Telephone Encounter (Signed)
Prescription Request  05/17/2022  Is this a "Controlled Substance" medicine? No  LOV: 03/08/2022  What is the name of the medication or equipment? oxyCODONE-acetaminophen (PERCOCET/ROXICET) 5-325 MG tablet [id number]   Have you contacted your pharmacy to request a refill? No   Which pharmacy would you like this sent to?  [facility]   Patient notified that their request is being sent to the clinical staff for review and that they should receive a response within 2 business days.   Please advise at Mobile [phone] (mobile)

## 2022-05-18 ENCOUNTER — Telehealth: Payer: Self-pay | Admitting: Family Medicine

## 2022-05-18 ENCOUNTER — Other Ambulatory Visit: Payer: Self-pay

## 2022-05-18 ENCOUNTER — Other Ambulatory Visit: Payer: Self-pay | Admitting: Family Medicine

## 2022-05-18 NOTE — Telephone Encounter (Signed)
Patient calling back need her pain medication sent in.  Per patient provider to send in every 5 days to her pharmacy.  Patient out of medicine now for 2 days.

## 2022-05-18 NOTE — Telephone Encounter (Signed)
Pt called back in regards to pain medication

## 2022-05-18 NOTE — Telephone Encounter (Signed)
Pt would like a refill on her pain medication, has been out for 2 days.

## 2022-05-19 ENCOUNTER — Other Ambulatory Visit: Payer: Self-pay | Admitting: Family Medicine

## 2022-05-19 DIAGNOSIS — J3089 Other allergic rhinitis: Secondary | ICD-10-CM

## 2022-05-19 MED ORDER — OXYCODONE-ACETAMINOPHEN 5-325 MG PO TABS: 1.0000 | ORAL_TABLET | Freq: Four times a day (QID) | ORAL | 0 refills | Status: DC | PRN

## 2022-05-19 NOTE — Telephone Encounter (Signed)
Patient calling the office, she contacted her pharmacy [facility], they have not received her pain medication.

## 2022-05-19 NOTE — Telephone Encounter (Signed)
Rx sent 

## 2022-05-19 NOTE — Telephone Encounter (Signed)
I have let pt know.

## 2022-05-22 NOTE — Telephone Encounter (Signed)
Lvm for pt

## 2022-05-23 ENCOUNTER — Encounter: Payer: Self-pay | Admitting: Family Medicine

## 2022-05-23 ENCOUNTER — Telehealth (INDEPENDENT_AMBULATORY_CARE_PROVIDER_SITE_OTHER): Payer: Medicaid Other | Admitting: Family Medicine

## 2022-05-23 ENCOUNTER — Other Ambulatory Visit: Payer: Self-pay | Admitting: Family Medicine

## 2022-05-23 ENCOUNTER — Telehealth: Payer: Self-pay | Admitting: Family Medicine

## 2022-05-23 DIAGNOSIS — R110 Nausea: Secondary | ICD-10-CM | POA: Diagnosis not present

## 2022-05-23 DIAGNOSIS — N3090 Cystitis, unspecified without hematuria: Secondary | ICD-10-CM

## 2022-05-23 DIAGNOSIS — J3089 Other allergic rhinitis: Secondary | ICD-10-CM | POA: Diagnosis not present

## 2022-05-23 MED ORDER — OXYCODONE-ACETAMINOPHEN 5-325 MG PO TABS: 1.0000 | ORAL_TABLET | Freq: Four times a day (QID) | ORAL | 0 refills | Status: DC | PRN

## 2022-05-23 MED ORDER — ONDANSETRON 4 MG PO TBDP: 4.0000 mg | ORAL_TABLET | Freq: Three times a day (TID) | ORAL | 0 refills | Status: DC | PRN

## 2022-05-23 MED ORDER — FLUTICASONE PROPIONATE 50 MCG/ACT NA SUSP: NASAL | 1 refills | Status: DC

## 2022-05-23 MED ORDER — SULFAMETHOXAZOLE-TRIMETHOPRIM 800-160 MG PO TABS: 1.0000 | ORAL_TABLET | Freq: Two times a day (BID) | ORAL | 0 refills | Status: AC

## 2022-05-23 NOTE — Telephone Encounter (Signed)
Patient calling said the provider has not yet called in her oxyCODONE-acetaminophen (PERCOCET/ROXICET) 5-325 MG tablet [id number]    [id number] [address] [id number]: [phone] Fax: [phone]

## 2022-05-23 NOTE — Progress Notes (Signed)
I called and spoke with the patient, informing her to pick up her Bactrim prescription at the pharmacy. She will be treated for a urinary tract infection.

## 2022-05-23 NOTE — Telephone Encounter (Signed)
Rx sent 

## 2022-05-23 NOTE — Telephone Encounter (Signed)
Lvm letting pt know.

## 2022-05-23 NOTE — Progress Notes (Signed)
Virtual Visit via Video Note  I connected with Tracie Green on 05/23/22 at  8:20 AM EST by a video enabled telemedicine application and verified that I am speaking with the correct person using two identifiers.  Patient Location: Home Provider Location: Office/Clinic  I discussed the limitations, risks, security, and privacy concerns of performing an evaluation and management service by video and the availability of in person appointments. I also discussed with the patient that there may be a patient responsible charge related to this service. The patient expressed understanding and agreed to proceed.  Subjective: PCP: Alvira Monday, FNP  Chief Complaint  Patient presents with   Nausea    Pt reports feeling nauseated, been throwing up, has headache, can't keep anything down.    HPI The patient is in today with complaints of nausea and vomiting, headaches, urgency and frequency of urination, lower abdominal pain, and nasal congestion.  Onset of symptoms 3 days ago.  No fever was reported.  She reports having chills off and on.  She reports low appetite with minimal p.o. and fluid intake.  She complains of fatigue and weakness.  Recent COVID exposure was 5 days ago.    ROS: Per HPI  Current Outpatient Medications:    atorvastatin (LIPITOR) 40 MG tablet, Take 1 tablet (40 mg total) by mouth daily., Disp: 30 tablet, Rfl: 1   clotrimazole (LOTRIMIN) 1 % cream, Apply 1 Application topically 2 (two) times daily., Disp: 30 g, Rfl: 0   dextromethorphan 15 MG/5ML syrup, Take 10 mLs (30 mg total) by mouth 4 (four) times daily as needed for cough., Disp: 120 mL, Rfl: 0   dicyclomine (BENTYL) 20 MG tablet, Take 1 tablet (20 mg total) by mouth 3 (three) times daily as needed for up to 21 doses for spasms., Disp: 21 tablet, Rfl: 0   levocetirizine (XYZAL) 5 MG tablet, Take 1 tablet (5 mg total) by mouth every evening., Disp: 30 tablet, Rfl: 1   lisinopril-hydrochlorothiazide (ZESTORETIC) 20-25 MG  tablet, Take 1 tablet by mouth daily., Disp: 90 tablet, Rfl: 1   naproxen (NAPROSYN) 500 MG tablet, Take 1 tablet (500 mg total) by mouth 2 (two) times daily with a meal., Disp: 20 tablet, Rfl: 0   oxyCODONE-acetaminophen (PERCOCET/ROXICET) 5-325 MG tablet, Take 1 tablet by mouth every 6 (six) hours as needed for severe pain., Disp: 20 tablet, Rfl: 0   pantoprazole (PROTONIX) 20 MG tablet, Take 1 tablet (20 mg total) by mouth daily., Disp: 90 tablet, Rfl: 1   PRESCRIPTION MEDICATION, darvocet, Disp: , Rfl:    PROBIOTIC PRODUCT PO, Take 1 capsule by mouth daily., Disp: , Rfl:    sertraline (ZOLOFT) 25 MG tablet, TAKE 1 TABLET (25 MG TOTAL) BY MOUTH DAILY., Disp: 90 tablet, Rfl: 2   tiZANidine (ZANAFLEX) 4 MG tablet, Take 1 tablet (4 mg total) by mouth every 6 (six) hours as needed for muscle spasms., Disp: 30 tablet, Rfl: 0   topiramate (TOPAMAX) 100 MG tablet, Take 1 tablet (100 mg total) by mouth 2 (two) times daily., Disp: 30 tablet, Rfl: 1   famotidine (PEPCID) 20 MG tablet, Take 1 tablet (20 mg total) by mouth daily for 30 doses. (Patient not taking: Reported on 01/23/2022), Disp: 30 tablet, Rfl: 0   fluticasone (FLONASE) 50 MCG/ACT nasal spray, SPRAY 2 SPRAYS INTO EACH NOSTRIL EVERY DAY, Disp: 48 mL, Rfl: 1   ondansetron (ZOFRAN-ODT) 4 MG disintegrating tablet, Take 1 tablet (4 mg total) by mouth every 8 (eight) hours as needed  for up to 20 doses for nausea or vomiting., Disp: 20 tablet, Rfl: 0  Observations/Objective: There were no vitals filed for this visit. Physical Exam   No labored breathing.  Speech is clear and coherent with logical content.  Patient is alert and oriented at baseline.   Assessment and Plan: Nausea -     Ondansetron; Take 1 tablet (4 mg total) by mouth every 8 (eight) hours as needed for up to 20 doses for nausea or vomiting.  Dispense: 20 tablet; Refill: 0 -     POCT URINALYSIS DIP (CLINITEK)  Allergic rhinitis due to other allergic trigger, unspecified  seasonality -     Fluticasone Propionate; SPRAY 2 SPRAYS INTO EACH NOSTRIL EVERY DAY  Dispense: 48 mL; Refill: 1 -     Coronavirus (COVID-19) with Influenza A and Influenza B   Encouraged to take Tylenol as needed for body aches and  headaches Encouraged use of Flonase nasal spray for congestion Encouraged use of heated humidifier for congestion Encouraged to leave a urine sample to assess for cystitis Will test patient for COVID Encouraged to increase her fluid intake, at least 64 ounces daily, and to get plenty of rest Encouraged to increase her p.o. intake   Follow Up Instructions: No follow-ups on file. For worsening symptoms I discussed the assessment and treatment plan with the patient. The patient was provided an opportunity to ask questions, and all were answered. The patient agreed with the plan and demonstrated an understanding of the instructions.   The patient was advised to call back or seek an in-person evaluation if the symptoms worsen or if the condition fails to improve as anticipated.  The above assessment and management plan was discussed with the patient. The patient verbalized understanding of and has agreed to the management plan.   Alvira Monday, FNP

## 2022-05-25 LAB — COVID-19, FLU A+B NAA
Influenza A, NAA: NOT DETECTED
Influenza B, NAA: NOT DETECTED
SARS-CoV-2, NAA: NOT DETECTED

## 2022-05-25 NOTE — Progress Notes (Signed)
Please inform the patient that she tested negative for COVID

## 2022-05-29 DIAGNOSIS — G4730 Sleep apnea, unspecified: Secondary | ICD-10-CM

## 2022-05-29 NOTE — Procedures (Signed)
     Patient Name: Tracie Green, Tracie Green Date: 05/16/2022 Gender: Female D.O.B: 20-Apr-1975 Age (years): 46 Referring Provider: Lyndal Pulley Height (inches): 45 Interpreting Physician: Chesley Mires MD, ABSM Weight (lbs): 382 RPSGT: Rosebud Poles BMI: 56 MRN: [id number] Neck Size: 18.50  CLINICAL INFORMATION Sleep Study Type: NPSG  Indication for sleep study: snoring, sleep disruption.  Epworth Sleepiness Score: 3  SLEEP STUDY TECHNIQUE As per the AASM Manual for the Scoring of Sleep and Associated Events v2.3 (April 2016) with a hypopnea requiring 4% desaturations.  The channels recorded and monitored were frontal, central and occipital EEG, electrooculogram (EOG), submentalis EMG (chin), nasal and oral airflow, thoracic and abdominal wall motion, anterior tibialis EMG, snore microphone, electrocardiogram, and pulse oximetry.  MEDICATIONS Medications self-administered by patient taken the night of the study : OXYCODONE HCL, TIZANIDINE  SLEEP ARCHITECTURE The study was initiated at 10:13:44 PM and ended at 4:42:10 AM.  Sleep onset time was 70.3 minutes and the sleep efficiency was 77.9%. The total sleep time was 302.7 minutes.  Stage REM latency was 137.0 minutes.  The patient spent 3.14% of the night in stage N1 sleep, 75.88% in stage N2 sleep, 9.09% in stage N3 and 11.9% in REM.  Alpha intrusion was absent.  Supine sleep was 37.17%.  RESPIRATORY PARAMETERS The overall apnea/hypopnea index (AHI) was 4.8 per hour. There were 1 total apneas, including 1 obstructive, 0 central and 0 mixed apneas. There were 23 hypopneas and 0 RERAs.  The AHI during Stage REM sleep was 21.7 per hour.  AHI while supine was 4.3 per hour.  The mean oxygen saturation was 93.01%. The minimum SpO2 during sleep was 85.00%.  Moderate snoring was noted during this study.  CARDIAC DATA The 2 lead EKG demonstrated sinus rhythm. The mean heart rate was 67.08 beats per minute. Other EKG  findings include: None.  LEG MOVEMENT DATA The total PLMS were 0 with a resulting PLMS index of 0.00. Associated arousal with leg movement index was 0.0 .  IMPRESSIONS - While she had several obstructive respiratory events, these were not frequent enough to qualify for a diagnosis of obstructive sleep apnea.  Her overall AHI was 4.8 with an SpO2 low of 85%.  The majority of her events occured during REM sleep. - She did not require supplemental oxygen during this study. - The patient snored with moderate snoring volume. - No cardiac abnormalities were noted during this study. - Clinically significant periodic limb movements did not occur during sleep. No significant associated arousals.  DIAGNOSIS - Snoring.  RECOMMENDATIONS - Avoid alcohol, sedatives and other CNS depressants that may worsen sleep apnea and disrupt normal sleep architecture. - Sleep hygiene should be reviewed to assess factors that may improve sleep quality. - Weight management and regular exercise should be initiated or continued if appropriate.  [Electronically signed] 05/29/2022 05:02 PM  Chesley Mires MD, ABSM Diplomate, American Board of Sleep Medicine NPI: [id number]  [place] PH: [phone]   FX: [phone] [place]

## 2022-05-30 ENCOUNTER — Other Ambulatory Visit: Payer: Self-pay | Admitting: Family Medicine

## 2022-05-30 ENCOUNTER — Telehealth: Payer: Self-pay | Admitting: Family Medicine

## 2022-05-30 DIAGNOSIS — G8929 Other chronic pain: Secondary | ICD-10-CM

## 2022-05-30 DIAGNOSIS — J3089 Other allergic rhinitis: Secondary | ICD-10-CM

## 2022-05-30 MED ORDER — OXYCODONE-ACETAMINOPHEN 5-325 MG PO TABS: 1.0000 | ORAL_TABLET | Freq: Four times a day (QID) | ORAL | 0 refills | Status: DC | PRN

## 2022-05-30 NOTE — Telephone Encounter (Signed)
Pt informed

## 2022-05-30 NOTE — Telephone Encounter (Signed)
Rx sent 

## 2022-05-30 NOTE — Telephone Encounter (Signed)
Patient called said she gets er pain medication called in on day 5 and today is day 5  oxyCODONE-acetaminophen (PERCOCET/ROXICET) 5-325 MG tablet [id number]   [facility]  Patient said her antibiotic for her UTI is not helping her, please contact patient at [phone].

## 2022-06-01 ENCOUNTER — Telehealth: Payer: Self-pay | Admitting: Family Medicine

## 2022-06-01 NOTE — Telephone Encounter (Signed)
Patient called in regard to mychart visit on 2/20 Patient states that she is not feeling any better, wants a call back in regard.

## 2022-06-02 ENCOUNTER — Encounter: Payer: Self-pay | Admitting: Family Medicine

## 2022-06-02 ENCOUNTER — Telehealth (INDEPENDENT_AMBULATORY_CARE_PROVIDER_SITE_OTHER): Payer: Medicaid Other | Admitting: Family Medicine

## 2022-06-02 DIAGNOSIS — I10 Essential (primary) hypertension: Secondary | ICD-10-CM

## 2022-06-02 DIAGNOSIS — J3089 Other allergic rhinitis: Secondary | ICD-10-CM

## 2022-06-02 DIAGNOSIS — N3000 Acute cystitis without hematuria: Secondary | ICD-10-CM | POA: Diagnosis not present

## 2022-06-02 MED ORDER — LEVOCETIRIZINE DIHYDROCHLORIDE 5 MG PO TABS: 5.0000 mg | ORAL_TABLET | Freq: Every evening | ORAL | 1 refills | Status: AC

## 2022-06-02 MED ORDER — LISINOPRIL-HYDROCHLOROTHIAZIDE 20-25 MG PO TABS: 1.0000 | ORAL_TABLET | Freq: Every day | ORAL | 1 refills | Status: DC

## 2022-06-02 NOTE — Progress Notes (Unsigned)
Virtual Visit via Video Note  I connected with Tracie Green on 06/03/22 at  4:20 PM EST by a video enabled telemedicine application and verified that I am speaking with the correct person using two identifiers.  Patient Location: Home Provider Location: Office/Clinic  I discussed the limitations, risks, security, and privacy concerns of performing an evaluation and management service by video and the availability of in person appointments. I also discussed with the patient that there may be a patient responsible charge related to this service. The patient expressed understanding and agreed to proceed.  Subjective: PCP: Alvira Monday, [place]  Chief Complaint  Patient presents with   Follow-up    Follow from 05/23/2022, pt reports antibiotics not helping, having sx of urinary frequency can't hold her urine and has went on herself a couple times, burning sensation.    HPI The patient is in today with complaints of urinary urgency and frequency. She denies fever and chills. She was treated for cystitis on 05/23/22 with bactrim for 6 days and reports minimum relief of her symptoms.  She is following up with the weight loss clinic, and amlodipine 5 mg was added to her medication regimen. She needs refills today.   She c/o of a burning sensation of her left lower extremity.   ROS: Per HPI  Current Outpatient Medications:    atorvastatin (LIPITOR) 40 MG tablet, Take 1 tablet (40 mg total) by mouth daily., Disp: 30 tablet, Rfl: 1   clotrimazole (LOTRIMIN) 1 % cream, Apply 1 Application topically 2 (two) times daily., Disp: 30 g, Rfl: 0   dextromethorphan 15 MG/5ML syrup, Take 10 mLs (30 mg total) by mouth 4 (four) times daily as needed for cough., Disp: 120 mL, Rfl: 0   dicyclomine (BENTYL) 20 MG tablet, Take 1 tablet (20 mg total) by mouth 3 (three) times daily as needed for up to 21 doses for spasms., Disp: 21 tablet, Rfl: 0   famotidine (PEPCID) 20 MG tablet, Take 1 tablet (20 mg total) by  mouth daily for 30 doses. (Patient not taking: Reported on 01/23/2022), Disp: 30 tablet, Rfl: 0   fluticasone (FLONASE) 50 MCG/ACT nasal spray, SPRAY 2 SPRAYS INTO EACH NOSTRIL EVERY DAY, Disp: 48 mL, Rfl: 1   levocetirizine (XYZAL) 5 MG tablet, Take 1 tablet (5 mg total) by mouth every evening., Disp: 30 tablet, Rfl: 1   lisinopril-hydrochlorothiazide (ZESTORETIC) 20-25 MG tablet, Take 1 tablet by mouth daily., Disp: 90 tablet, Rfl: 1   naproxen (NAPROSYN) 500 MG tablet, Take 1 tablet (500 mg total) by mouth 2 (two) times daily with a meal., Disp: 20 tablet, Rfl: 0   ondansetron (ZOFRAN-ODT) 4 MG disintegrating tablet, Take 1 tablet (4 mg total) by mouth every 8 (eight) hours as needed for up to 20 doses for nausea or vomiting., Disp: 20 tablet, Rfl: 0   oxyCODONE-acetaminophen (PERCOCET/ROXICET) 5-325 MG tablet, Take 1 tablet by mouth every 6 (six) hours as needed for severe pain., Disp: 20 tablet, Rfl: 0   pantoprazole (PROTONIX) 20 MG tablet, Take 1 tablet (20 mg total) by mouth daily., Disp: 90 tablet, Rfl: 1   PRESCRIPTION MEDICATION, darvocet, Disp: , Rfl:    PROBIOTIC PRODUCT PO, Take 1 capsule by mouth daily., Disp: , Rfl:    sertraline (ZOLOFT) 25 MG tablet, TAKE 1 TABLET (25 MG TOTAL) BY MOUTH DAILY., Disp: 90 tablet, Rfl: 2   tiZANidine (ZANAFLEX) 4 MG tablet, Take 1 tablet (4 mg total) by mouth every 6 (six) hours as needed for  muscle spasms., Disp: 30 tablet, Rfl: 0   topiramate (TOPAMAX) 100 MG tablet, Take 1 tablet (100 mg total) by mouth 2 (two) times daily., Disp: 30 tablet, Rfl: 1  Observations/Objective: There were no vitals filed for this visit. Physical Exam Speech is clear and coherent with logical content.  Patient is alert and oriented at baseline.   Assessment and Plan: Essential hypertension -     Lisinopril-hydroCHLOROthiazide; Take 1 tablet by mouth daily.  Dispense: 90 tablet; Refill: 1  Allergic rhinitis due to other allergic trigger, unspecified seasonality -      Levocetirizine Dihydrochloride; Take 1 tablet (5 mg total) by mouth every evening.  Dispense: 30 tablet; Refill: 1  Cystitis Encouraged the patient to leave a urine sample to assess for cystitis Encouraged to increase her fluid at least 64 ounces daily  Neuropathy Encouraged to continue taking her necrotics, will reconvene during her office visit  Follow Up Instructions: No follow-ups on file.   I discussed the assessment and treatment plan with the patient. The patient was provided an opportunity to ask questions, and all were answered. The patient agreed with the plan and demonstrated an understanding of the instructions.   The patient was advised to call back or seek an in-person evaluation if the symptoms worsen or if the condition fails to improve as anticipated.  The above assessment and management plan was discussed with the patient. The patient verbalized understanding of and has agreed to the management plan.   Alvira Monday, FNP

## 2022-06-03 ENCOUNTER — Other Ambulatory Visit: Payer: Self-pay | Admitting: Family Medicine

## 2022-06-03 DIAGNOSIS — G8929 Other chronic pain: Secondary | ICD-10-CM

## 2022-06-06 ENCOUNTER — Other Ambulatory Visit (HOSPITAL_COMMUNITY)
Admission: RE | Admit: 2022-06-06 | Discharge: 2022-06-06 | Disposition: A | Discharge status: Home / Self Care | Payer: Medicaid Other | Source: Ambulatory Visit | Attending: Family Medicine | Admitting: Family Medicine

## 2022-06-06 ENCOUNTER — Encounter: Payer: Self-pay | Admitting: Family Medicine

## 2022-06-06 ENCOUNTER — Ambulatory Visit: Payer: Medicaid Other | Admitting: Family Medicine

## 2022-06-06 VITALS — BP 128/86 | HR 90 | Ht 70.0 in | Wt 374.0 lb

## 2022-06-06 DIAGNOSIS — W19XXXA Unspecified fall, initial encounter: Secondary | ICD-10-CM

## 2022-06-06 DIAGNOSIS — E559 Vitamin D deficiency, unspecified: Secondary | ICD-10-CM

## 2022-06-06 DIAGNOSIS — M5440 Lumbago with sciatica, unspecified side: Secondary | ICD-10-CM

## 2022-06-06 DIAGNOSIS — G8929 Other chronic pain: Secondary | ICD-10-CM

## 2022-06-06 DIAGNOSIS — Z01411 Encounter for gynecological examination (general) (routine) with abnormal findings: Secondary | ICD-10-CM

## 2022-06-06 DIAGNOSIS — E7849 Other hyperlipidemia: Secondary | ICD-10-CM

## 2022-06-06 DIAGNOSIS — Z124 Encounter for screening for malignant neoplasm of cervix: Secondary | ICD-10-CM

## 2022-06-06 DIAGNOSIS — E038 Other specified hypothyroidism: Secondary | ICD-10-CM

## 2022-06-06 DIAGNOSIS — R7301 Impaired fasting glucose: Secondary | ICD-10-CM

## 2022-06-06 MED ORDER — OXYCODONE-ACETAMINOPHEN 5-325 MG PO TABS: 1.0000 | ORAL_TABLET | Freq: Four times a day (QID) | ORAL | 0 refills | Status: DC | PRN

## 2022-06-06 MED ORDER — TIZANIDINE HCL 4 MG PO TABS: 4.0000 mg | ORAL_TABLET | Freq: Four times a day (QID) | ORAL | 0 refills | Status: DC | PRN

## 2022-06-06 NOTE — Patient Instructions (Addendum)
I appreciate the opportunity to provide care to you today!    Follow up:  06/19/22  Labs: please stop by the lab today/during the week  to get your blood drawn (CBC, CMP, TSH, Lipid profile, HgA1c, Vit D)  Please pick up your     Please continue to a heart-healthy diet and increase your physical activities. Try to exercise for 20mns at least five times a week.      It was a pleasure to see you and I look forward to continuing to work together on your health and well-being. Please do not hesitate to call the office if you need care or have questions about your care.   Have a wonderful day and week. With Gratitude, GAlvira MondayMSN, FNP-BC

## 2022-06-06 NOTE — Progress Notes (Addendum)
Acute Office Visit  Subjective:    Patient ID: Tracie Green, female    DOB: 15-Feb-1976, 47 y.o.   MRN: [id number]  Chief Complaint  Patient presents with   Gynecologic Exam    Due for pap.   Back Pain    Pt due for pain medication, pain clinic appt is coming up in 04/15 would like to have an rx to cover her until she gets in, insurance is giving her trouble due to prescriptions being written for 5 days rather than longer periods.     HPI Patient is in today for pap smear exam.  Past Medical History:  Diagnosis Date   Hypertension    PE (pulmonary embolism)     Past Surgical History:  Procedure Laterality Date   CESAREAN SECTION     CHOLECYSTECTOMY  1998   INCISION AND DRAINAGE      History reviewed. No pertinent family history.  Social History   Socioeconomic History   Marital status: Married    Spouse name: Not on file   Number of children: Not on file   Years of education: Not on file   Highest education level: Not on file  Occupational History   Not on file  Tobacco Use   Smoking status: Never   Smokeless tobacco: Never  Substance and Sexual Activity   Alcohol use: No   Drug use: No   Sexual activity: Not on file  Other Topics Concern   Not on file  Social History Narrative   Not on file   Social Determinants of Health   Financial Resource Strain: Not on file  Food Insecurity: No Food Insecurity (01/26/2022)   Hunger Vital Sign    Worried About Running Out of Food in the Last Year: Never true    Ran Out of Food in the Last Year: Never true  Transportation Needs: No Transportation Needs (01/26/2022)   PRAPARE - Hydrologist (Medical): No    Lack of Transportation (Non-Medical): No  Physical Activity: Not on file  Stress: Not on file  Social Connections: Not on file  Intimate Partner Violence: Not At Risk (01/26/2022)   Humiliation, Afraid, Rape, and Kick questionnaire    Fear of Current or Ex-Partner: No     Emotionally Abused: No    Physically Abused: No    Sexually Abused: No    Outpatient Medications Prior to Visit  Medication Sig Dispense Refill   atorvastatin (LIPITOR) 40 MG tablet Take 1 tablet (40 mg total) by mouth daily. 30 tablet 1   clotrimazole (LOTRIMIN) 1 % cream Apply 1 Application topically 2 (two) times daily. 30 g 0   dextromethorphan 15 MG/5ML syrup Take 10 mLs (30 mg total) by mouth 4 (four) times daily as needed for cough. 120 mL 0   dicyclomine (BENTYL) 20 MG tablet Take 1 tablet (20 mg total) by mouth 3 (three) times daily as needed for up to 21 doses for spasms. 21 tablet 0   fluticasone (FLONASE) 50 MCG/ACT nasal spray SPRAY 2 SPRAYS INTO EACH NOSTRIL EVERY DAY 48 mL 1   levocetirizine (XYZAL) 5 MG tablet Take 1 tablet (5 mg total) by mouth every evening. 30 tablet 1   lisinopril-hydrochlorothiazide (ZESTORETIC) 20-25 MG tablet Take 1 tablet by mouth daily. 90 tablet 1   naproxen (NAPROSYN) 500 MG tablet Take 1 tablet (500 mg total) by mouth 2 (two) times daily with a meal. 20 tablet 0   ondansetron (ZOFRAN-ODT) 4 MG disintegrating  tablet Take 1 tablet (4 mg total) by mouth every 8 (eight) hours as needed for up to 20 doses for nausea or vomiting. 20 tablet 0   pantoprazole (PROTONIX) 20 MG tablet Take 1 tablet (20 mg total) by mouth daily. 90 tablet 1   PRESCRIPTION MEDICATION darvocet     PROBIOTIC PRODUCT PO Take 1 capsule by mouth daily.     sertraline (ZOLOFT) 25 MG tablet TAKE 1 TABLET (25 MG TOTAL) BY MOUTH DAILY. 90 tablet 2   topiramate (TOPAMAX) 100 MG tablet Take 1 tablet (100 mg total) by mouth 2 (two) times daily. 30 tablet 1   oxyCODONE-acetaminophen (PERCOCET/ROXICET) 5-325 MG tablet Take 1 tablet by mouth every 6 (six) hours as needed for severe pain. 20 tablet 0   tiZANidine (ZANAFLEX) 4 MG tablet TAKE 1 TABLET BY MOUTH EVERY 6 HOURS AS NEEDED FOR MUSCLE SPASMS. 30 tablet 0   famotidine (PEPCID) 20 MG tablet Take 1 tablet (20 mg total) by mouth daily for  30 doses. (Patient not taking: Reported on 01/23/2022) 30 tablet 0   No facility-administered medications prior to visit.    Allergies  Allergen Reactions   Penicillins Hives and Rash    Hives   Morphine Nausea And Vomiting   Morphine And Related Nausea And Vomiting    Review of Systems  Constitutional:  Negative for chills and fever.  Eyes:  Negative for visual disturbance.  Respiratory:  Negative for chest tightness and shortness of breath.   Genitourinary:  Negative for decreased urine volume, urgency and vaginal bleeding.  Musculoskeletal:        Right leg pain  Neurological:  Negative for dizziness and headaches.       Objective:    Physical Exam HENT:     Head: Normocephalic.     Mouth/Throat:     Mouth: Mucous membranes are moist.  Cardiovascular:     Rate and Rhythm: Normal rate.     Heart sounds: Normal heart sounds.  Pulmonary:     Effort: Pulmonary effort is normal.     Breath sounds: Normal breath sounds.  Genitourinary:    Exam position: Lithotomy position.     Comments: Vaginal wall: pink and rugated, smooth and non-tender; absence of lesions, edema, and erythema. Labia Majora and Minora: present bilaterally, moist, soft tissue, and homogeneous; free of edema and ulcerations. Clitoris is anatomically present, above the urethral, and free of lesions, masses, and ulceration.    Skin:    Findings: Bruising (right lateral lower extremity) present.  Neurological:     Mental Status: She is alert.     BP 128/86   Pulse 90   Ht 5\' 10"  (1.778 m)   Wt (!) 374 lb 0.6 oz (169.7 kg)   SpO2 96%   BMI 53.67 kg/m  Wt Readings from Last 3 Encounters:  06/06/22 (!) 374 lb 0.6 oz (169.7 kg)  03/20/22 (!) 385 lb (174.6 kg)  03/08/22 (!) 392 lb (177.8 kg)       Assessment & Plan:  Pap smear for cervical cancer screening Assessment & Plan: -Cytology and HPV co-testing (preferred) every 5 years or cytology alone (acceptable) every 3 years. - Pap due  2029    Cervical cancer screening -     Cytology - PAP  Falls, initial encounter Assessment & Plan: Onset of  injury on June 03, 2022, between 2 to 3 AM She reports while returning from the restroom to her bed, her right leg gave out on her, and  she fell on her right knee She reports swelling in her right ankle, which has subsided, and pain in her right knee Pain is rated 9 out of 10 and is aggravated with ambulation She describes the pain as sharp and stabbing, radiating from her right ankle to her knee She reports mild swelling of her right knee with an abrasion noted on her right lower extremity Encouraged alternating with ice and heat therapy to decrease swelling Encourage activity modification and rest of the injury or knee and the right lower extremity Encouraged use of a knee brace to provide immobilization and stability Will get an x-ray of the right knee and ankle to assess for fracture or dislocation Her gait is grossly intact and neurovascularly intact, with minor abrasion and ecchymosis noted at the affected site Sensation equal bilaterally     Other chronic pain -     tiZANidine HCl; Take 1 tablet (4 mg total) by mouth every 6 (six) hours as needed for muscle spasms.  Dispense: 30 tablet; Refill: 0  Chronic midline low back pain with sciatica, sciatica laterality unspecified  IFG (impaired fasting glucose) -     Hemoglobin A1c  Vitamin D deficiency -     VITAMIN D 25 Hydroxy (Vit-D Deficiency, Fractures)  Other specified hypothyroidism -     TSH + free T4  Other hyperlipidemia -     Lipid panel -     CMP14+EGFR -     CBC with Differential/Platelet  Fall, initial encounter -     DG FEMUR, MIN 2 VIEWS RIGHT -     DG Ankle Complete Right    Alvira Monday, FNP

## 2022-06-07 LAB — VITAMIN D 25 HYDROXY (VIT D DEFICIENCY, FRACTURES): Vit D, 25-Hydroxy: 26.9 ng/mL — ABNORMAL LOW (ref 30.0–100.0)

## 2022-06-07 LAB — CBC WITH DIFFERENTIAL/PLATELET
Basophils Absolute: 0.1 10*3/uL (ref 0.0–0.2)
Basos: 1 %
EOS (ABSOLUTE): 0.5 10*3/uL — ABNORMAL HIGH (ref 0.0–0.4)
Eos: 5 %
Hematocrit: 40.7 % (ref 34.0–46.6)
Hemoglobin: 13.1 g/dL (ref 11.1–15.9)
Immature Grans (Abs): 0 10*3/uL (ref 0.0–0.1)
Immature Granulocytes: 0 %
Lymphocytes Absolute: 2.7 10*3/uL (ref 0.7–3.1)
Lymphs: 25 %
MCH: 27.1 pg (ref 26.6–33.0)
MCHC: 32.2 g/dL (ref 31.5–35.7)
MCV: 84 fL (ref 79–97)
Monocytes Absolute: 0.8 10*3/uL (ref 0.1–0.9)
Monocytes: 8 %
Neutrophils Absolute: 6.8 10*3/uL (ref 1.4–7.0)
Neutrophils: 61 %
Platelets: 351 10*3/uL (ref 150–450)
RBC: 4.84 x10E6/uL (ref 3.77–5.28)
RDW: 13 % (ref 11.7–15.4)
WBC: 11 10*3/uL — ABNORMAL HIGH (ref 3.4–10.8)

## 2022-06-07 LAB — LIPID PANEL
Chol/HDL Ratio: 2.6 ratio (ref 0.0–4.4)
Cholesterol, Total: 161 mg/dL (ref 100–199)
HDL: 62 mg/dL (ref >39)
LDL Chol Calc (NIH): 84 mg/dL (ref 0–99)
Triglycerides: 78 mg/dL (ref 0–149)
VLDL Cholesterol Cal: 15 mg/dL (ref 5–40)

## 2022-06-07 LAB — HEMOGLOBIN A1C
Est. average glucose Bld gHb Est-mCnc: 103 mg/dL
Hgb A1c MFr Bld: 5.2 % (ref 4.8–5.6)

## 2022-06-07 LAB — CMP14+EGFR
ALT: 13 IU/L (ref 0–32)
AST: 17 IU/L (ref 0–40)
Albumin/Globulin Ratio: 1.1 — ABNORMAL LOW (ref 1.2–2.2)
Albumin: 4.1 g/dL (ref 3.9–4.9)
Alkaline Phosphatase: 128 IU/L — ABNORMAL HIGH (ref 44–121)
BUN/Creatinine Ratio: 15 (ref 9–23)
BUN: 20 mg/dL (ref 6–24)
Bilirubin Total: 0.4 mg/dL (ref 0.0–1.2)
CO2: 20 mmol/L (ref 20–29)
Calcium: 9.4 mg/dL (ref 8.7–10.2)
Chloride: 103 mmol/L (ref 96–106)
Creatinine, Ser: 1.34 mg/dL — ABNORMAL HIGH (ref 0.57–1.00)
Globulin, Total: 3.7 g/dL (ref 1.5–4.5)
Glucose: 89 mg/dL (ref 70–99)
Potassium: 4.5 mmol/L (ref 3.5–5.2)
Sodium: 138 mmol/L (ref 134–144)
Total Protein: 7.8 g/dL (ref 6.0–8.5)
eGFR: 50 mL/min/{1.73_m2} — ABNORMAL LOW (ref >59)

## 2022-06-07 LAB — TSH+FREE T4
Free T4: 1.35 ng/dL (ref 0.82–1.77)
TSH: 1.79 u[IU]/mL (ref 0.450–4.500)

## 2022-06-08 ENCOUNTER — Telehealth: Payer: Self-pay | Admitting: Family Medicine

## 2022-06-08 ENCOUNTER — Ambulatory Visit (HOSPITAL_COMMUNITY)
Admission: RE | Admit: 2022-06-08 | Discharge: 2022-06-08 | Disposition: A | Discharge status: Home / Self Care | Payer: Medicaid Other | Source: Ambulatory Visit | Attending: Family Medicine | Admitting: Family Medicine

## 2022-06-08 DIAGNOSIS — W19XXXA Unspecified fall, initial encounter: Secondary | ICD-10-CM | POA: Insufficient documentation

## 2022-06-08 DIAGNOSIS — M79651 Pain in right thigh: Secondary | ICD-10-CM | POA: Diagnosis not present

## 2022-06-08 DIAGNOSIS — M25551 Pain in right hip: Secondary | ICD-10-CM | POA: Insufficient documentation

## 2022-06-08 DIAGNOSIS — Z124 Encounter for screening for malignant neoplasm of cervix: Secondary | ICD-10-CM | POA: Insufficient documentation

## 2022-06-08 NOTE — Telephone Encounter (Signed)
Patient calling about test results.

## 2022-06-08 NOTE — Assessment & Plan Note (Addendum)
Onset of  injury on June 03, 2022, between 2 to 3 AM She reports while returning from the restroom to her bed, her right leg gave out on her, and she fell on her right knee She reports swelling in her right ankle, which has subsided, and pain in her right knee Pain is rated 9 out of 10 and is aggravated with ambulation She describes the pain as sharp and stabbing, radiating from her right ankle to her knee She reports mild swelling of her right knee with an abrasion noted on her right lower extremity Encouraged alternating with ice and heat therapy to decrease swelling Encourage activity modification and rest of the injury or knee and the right lower extremity Encouraged use of a knee brace to provide immobilization and stability Will get an x-ray of the right knee and ankle to assess for fracture or dislocation Her gait is grossly intact and neurovascularly intact, with minor abrasion and ecchymosis noted at the affected site Sensation equal bilaterally

## 2022-06-08 NOTE — Assessment & Plan Note (Signed)
Cytology and HPV co-testing (preferred) every 5 years or cytology alone (acceptable) every 3 years. - Pap due 2029   

## 2022-06-09 ENCOUNTER — Ambulatory Visit (HOSPITAL_COMMUNITY)
Admission: RE | Admit: 2022-06-09 | Discharge: 2022-06-09 | Disposition: A | Discharge status: Home / Self Care | Payer: Medicaid Other | Source: Ambulatory Visit | Attending: Family Medicine | Admitting: Family Medicine

## 2022-06-09 ENCOUNTER — Other Ambulatory Visit: Payer: Self-pay | Admitting: Family Medicine

## 2022-06-09 ENCOUNTER — Ambulatory Visit (INDEPENDENT_AMBULATORY_CARE_PROVIDER_SITE_OTHER): Payer: Medicaid Other

## 2022-06-09 ENCOUNTER — Other Ambulatory Visit: Payer: Self-pay

## 2022-06-09 DIAGNOSIS — W19XXXA Unspecified fall, initial encounter: Secondary | ICD-10-CM | POA: Diagnosis not present

## 2022-06-09 DIAGNOSIS — R350 Frequency of micturition: Secondary | ICD-10-CM | POA: Diagnosis not present

## 2022-06-09 DIAGNOSIS — M7731 Calcaneal spur, right foot: Secondary | ICD-10-CM | POA: Diagnosis not present

## 2022-06-09 DIAGNOSIS — M1711 Unilateral primary osteoarthritis, right knee: Secondary | ICD-10-CM | POA: Insufficient documentation

## 2022-06-09 DIAGNOSIS — G8929 Other chronic pain: Secondary | ICD-10-CM

## 2022-06-09 DIAGNOSIS — N3090 Cystitis, unspecified without hematuria: Secondary | ICD-10-CM

## 2022-06-09 LAB — POCT URINALYSIS DIP (CLINITEK)
Bilirubin, UA: NEGATIVE
Blood, UA: NEGATIVE
Glucose, UA: NEGATIVE mg/dL
Ketones, POC UA: NEGATIVE mg/dL
Leukocytes, UA: NEGATIVE
Nitrite, UA: POSITIVE — AB
POC PROTEIN,UA: NEGATIVE
Spec Grav, UA: 1.02 (ref 1.010–1.025)
Urobilinogen, UA: 0.2 E.U./dL
pH, UA: 5.5 (ref 5.0–8.0)

## 2022-06-09 MED ORDER — NITROFURANTOIN MONOHYD MACRO 100 MG PO CAPS: 100.0000 mg | ORAL_CAPSULE | Freq: Two times a day (BID) | ORAL | 0 refills | Status: AC

## 2022-06-09 MED ORDER — OXYCODONE-ACETAMINOPHEN 5-325 MG PO TABS: 1.0000 | ORAL_TABLET | Freq: Four times a day (QID) | ORAL | 0 refills | Status: DC | PRN

## 2022-06-09 MED ORDER — UNABLE TO FIND: 0 refills | Status: DC

## 2022-06-09 NOTE — Progress Notes (Signed)
Please inform the patient that a prescription for Macrobid 100 mg to take twice daily for 7 days have been sent to her pharmacy.  Her urine shows that she has UTI.  I will also place imaging orders for x-ray of her right knee and ankles.  A prescription for a knee brace is placed.  I have also refilled her pain medicine.

## 2022-06-09 NOTE — Telephone Encounter (Signed)
Spoke with the patient

## 2022-06-09 NOTE — Addendum Note (Signed)
Addended byAlvira Monday on: 06/09/2022 09:33 AM   Modules accepted: Orders

## 2022-06-10 NOTE — Progress Notes (Signed)
Please inform the patient that the x-ray of her right hip and knee was negative for fractures. 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

## 2022-06-10 NOTE — Progress Notes (Signed)
Please inform the patient that the x-ray of her right ankles was negative for fractures and dislocations.

## 2022-06-10 NOTE — Progress Notes (Signed)
Please inform the patient that the x-ray of her knee shows no evidence of fractures or dislocation. She does have arthritis  in her knee.

## 2022-06-12 ENCOUNTER — Telehealth: Payer: Self-pay | Admitting: Family Medicine

## 2022-06-12 ENCOUNTER — Other Ambulatory Visit: Payer: Self-pay | Admitting: Family Medicine

## 2022-06-12 DIAGNOSIS — M25561 Pain in right knee: Secondary | ICD-10-CM

## 2022-06-12 MED ORDER — UNABLE TO FIND: 0 refills | Status: DC

## 2022-06-12 NOTE — Telephone Encounter (Signed)
Rx faxed

## 2022-06-12 NOTE — Telephone Encounter (Signed)
Was in last week to see the provider, provider called in a knee brace and patient went to pick it up said did not call the correct one in.   Prescription needs to wrote as with metal hinges to support the knee so that her medicaid will cover it.   Pharmacy: [facility]

## 2022-06-12 NOTE — Telephone Encounter (Signed)
Prescription Request  06/12/2022  LOV: 06/06/2022  What is the name of the medication or equipment? oxyCODONE-acetaminophen (PERCOCET/ROXICET) 5-325 MG tablet [id number]   Have you contacted your pharmacy to request a refill? Yes   Which pharmacy would you like this sent to?  [facility]   Patient notified that their request is being sent to the clinical staff for review and that they should receive a response within 2 business days.   Please advise at Mobile [phone] (mobile)

## 2022-06-12 NOTE — Telephone Encounter (Signed)
Refilled on 06/09/2022

## 2022-06-12 NOTE — Telephone Encounter (Signed)
Patient called in needs refill on oxyCODONE-acetaminophen (PERCOCET/ROXICET) 5-325 MG tablet Was told to call every 3 days   Also wants to know if knee brace has been sent in to [facility]

## 2022-06-12 NOTE — Telephone Encounter (Signed)
The prescription is printed. Please fax it to [facility]

## 2022-06-13 ENCOUNTER — Other Ambulatory Visit: Payer: Self-pay | Admitting: Family Medicine

## 2022-06-13 ENCOUNTER — Telehealth: Payer: Self-pay | Admitting: Family Medicine

## 2022-06-13 DIAGNOSIS — G8929 Other chronic pain: Secondary | ICD-10-CM

## 2022-06-13 LAB — CYTOLOGY - PAP
Chlamydia: NEGATIVE
Comment: NEGATIVE
Comment: NEGATIVE
Comment: NEGATIVE
Comment: NEGATIVE
Comment: NORMAL
Diagnosis: NEGATIVE
HSV1: NEGATIVE
HSV2: NEGATIVE
High risk HPV: NEGATIVE
Neisseria Gonorrhea: NEGATIVE
Trichomonas: NEGATIVE

## 2022-06-13 MED ORDER — OXYCODONE-ACETAMINOPHEN 5-325 MG PO TABS: 1.0000 | ORAL_TABLET | Freq: Four times a day (QID) | ORAL | 0 refills | Status: DC | PRN

## 2022-06-13 NOTE — Telephone Encounter (Signed)
Rx sent 

## 2022-06-13 NOTE — Progress Notes (Signed)
Please inform the patient that her pap smear was negative for abnormal growth or malignancy on her cervix.

## 2022-06-13 NOTE — Telephone Encounter (Signed)
Patient called in servere pain need pain med called in   oxyCODONE-acetaminophen (PERCOCET/ROXICET) 5-325 MG tablet [id number]    Pharmacy: [facility]

## 2022-06-16 ENCOUNTER — Telehealth: Payer: Self-pay | Admitting: Family Medicine

## 2022-06-16 ENCOUNTER — Other Ambulatory Visit: Payer: Self-pay | Admitting: Family Medicine

## 2022-06-16 DIAGNOSIS — A499 Bacterial infection, unspecified: Secondary | ICD-10-CM

## 2022-06-16 DIAGNOSIS — N3090 Cystitis, unspecified without hematuria: Secondary | ICD-10-CM

## 2022-06-16 LAB — URINE CULTURE

## 2022-06-16 MED ORDER — FOSFOMYCIN TROMETHAMINE 3 G PO PACK: 3.0000 g | PACK | Freq: Once | ORAL | 0 refills | Status: DC

## 2022-06-16 MED ORDER — FOSFOMYCIN TROMETHAMINE 3 G PO PACK: 3.0000 g | PACK | Freq: Once | ORAL | 0 refills | Status: AC

## 2022-06-16 NOTE — Progress Notes (Signed)
Please inform the patient that a fosfomycin 3g pack prescription has been sent to her pharmacy for her UTI. The antibiotic that was previously taken is resistant to the bacteria that is causing her urinary tract infection.

## 2022-06-16 NOTE — Telephone Encounter (Signed)
Prescription Request  06/16/2022  LOV: 06/06/2022  What is the name of the medication or equipment? oxyCODONE-acetaminophen (PERCOCET/ROXICET) 5-325 MG tablet   Have you contacted your pharmacy to request a refill? No   Which pharmacy would you like this sent to?  [facility] [address] [place] [address] Phone: [phone] Fax: [phone]  [id number] [place], [place] [address] Phone: [phone] Fax: [phone]  [id number] [place], [place] [address] Phone: [phone] Fax: [phone]    Patient notified that their request is being sent to the clinical staff for review and that they should receive a response within 2 business days.   Please advise at [facility] [phone]

## 2022-06-19 ENCOUNTER — Telehealth (INDEPENDENT_AMBULATORY_CARE_PROVIDER_SITE_OTHER): Payer: Medicaid Other | Admitting: Family Medicine

## 2022-06-19 ENCOUNTER — Other Ambulatory Visit: Payer: Self-pay | Admitting: Family Medicine

## 2022-06-19 DIAGNOSIS — Z1211 Encounter for screening for malignant neoplasm of colon: Secondary | ICD-10-CM

## 2022-06-19 DIAGNOSIS — G8929 Other chronic pain: Secondary | ICD-10-CM

## 2022-06-19 MED ORDER — OXYCODONE-ACETAMINOPHEN 5-325 MG PO TABS: 1.0000 | ORAL_TABLET | Freq: Four times a day (QID) | ORAL | 0 refills | Status: DC | PRN

## 2022-06-19 NOTE — Telephone Encounter (Signed)
Rx sent 

## 2022-06-19 NOTE — Telephone Encounter (Signed)
Spoke to patient

## 2022-06-19 NOTE — Progress Notes (Signed)
Virtual Visit via Video Note  I connected with Tracie Green on 06/19/22 at  4:20 PM EDT by a video enabled telemedicine application and verified that I am speaking with the correct person using two identifiers.  Patient Location: Home Provider Location: Office/Clinic  I discussed the limitations, risks, security, and privacy concerns of performing an evaluation and management service by video and the availability of in person appointments. I also discussed with the patient that there may be a patient responsible charge related to this service. The patient expressed understanding and agreed to proceed.  Subjective: PCP: Alvira Monday, FNP  No chief complaint on file.  HPI Essential hypertension: She reports elevated BP 2 days ago but reports that her most recent BP 113/78.  She complains of headaches and lightheadedness dizziness, blurred vision, chest pain, palpitation, shortness of.  She takes lisinopril/hydrochlorothiazide 20 to 25 mg daily.  She reports compliance with treatment regimen.  Panic attacks: Stable on Zoloft 25 mg daily.  Denies suicidal thoughts or ideation.  GERD: Reports that her insurance does not cover Protonix 20 mg  and has been taking Tums over-the-counter.   ROS: Per HPI  Current Outpatient Medications:    atorvastatin (LIPITOR) 40 MG tablet, Take 1 tablet (40 mg total) by mouth daily., Disp: 30 tablet, Rfl: 1   clotrimazole (LOTRIMIN) 1 % cream, Apply 1 Application topically 2 (two) times daily., Disp: 30 g, Rfl: 0   dextromethorphan 15 MG/5ML syrup, Take 10 mLs (30 mg total) by mouth 4 (four) times daily as needed for cough., Disp: 120 mL, Rfl: 0   dicyclomine (BENTYL) 20 MG tablet, Take 1 tablet (20 mg total) by mouth 3 (three) times daily as needed for up to 21 doses for spasms., Disp: 21 tablet, Rfl: 0   famotidine (PEPCID) 20 MG tablet, Take 1 tablet (20 mg total) by mouth daily for 30 doses. (Patient not taking: Reported on 01/23/2022), Disp: 30  tablet, Rfl: 0   fluticasone (FLONASE) 50 MCG/ACT nasal spray, SPRAY 2 SPRAYS INTO EACH NOSTRIL EVERY DAY, Disp: 48 mL, Rfl: 1   levocetirizine (XYZAL) 5 MG tablet, Take 1 tablet (5 mg total) by mouth every evening., Disp: 30 tablet, Rfl: 1   lisinopril-hydrochlorothiazide (ZESTORETIC) 20-25 MG tablet, Take 1 tablet by mouth daily., Disp: 90 tablet, Rfl: 1   naproxen (NAPROSYN) 500 MG tablet, Take 1 tablet (500 mg total) by mouth 2 (two) times daily with a meal., Disp: 20 tablet, Rfl: 0   ondansetron (ZOFRAN-ODT) 4 MG disintegrating tablet, Take 1 tablet (4 mg total) by mouth every 8 (eight) hours as needed for up to 20 doses for nausea or vomiting., Disp: 20 tablet, Rfl: 0   oxyCODONE-acetaminophen (PERCOCET/ROXICET) 5-325 MG tablet, Take 1 tablet by mouth every 6 (six) hours as needed for severe pain., Disp: 15 tablet, Rfl: 0   pantoprazole (PROTONIX) 20 MG tablet, Take 1 tablet (20 mg total) by mouth daily., Disp: 90 tablet, Rfl: 1   PRESCRIPTION MEDICATION, darvocet, Disp: , Rfl:    PROBIOTIC PRODUCT PO, Take 1 capsule by mouth daily., Disp: , Rfl:    sertraline (ZOLOFT) 25 MG tablet, TAKE 1 TABLET (25 MG TOTAL) BY MOUTH DAILY., Disp: 90 tablet, Rfl: 2   tiZANidine (ZANAFLEX) 4 MG tablet, Take 1 tablet (4 mg total) by mouth every 6 (six) hours as needed for muscle spasms., Disp: 30 tablet, Rfl: 0   topiramate (TOPAMAX) 100 MG tablet, Take 1 tablet (100 mg total) by mouth 2 (two) times daily., Disp:  30 tablet, Rfl: 1   UNABLE TO FIND, Med Name: Right knee brace ICD: W19.Merril Abbe, Disp: 1 each, Rfl: 0   UNABLE TO FIND, Med Name:  metal hinges to support the knee W19.XXA M25.561, Disp: 1 each, Rfl: 0  Observations/Objective:  Physical Exam  Speech is clear and coherent with logical content.  Patient is alert and oriented at baseline.   Assessment and Plan: Colon cancer screening -     Ambulatory referral to Gastroenterology  Hypertension: Encouraged to report to the clinic anytime this week to  assess her blood pressure and make adjustment to her medications as indicated.  No refills needed today  GERD: Encouraged the patient to contact her insurance to verify the preferred medication that is covered.  Panic attacks: Encouraged to continue Zoloft 25 mg daily.  No refills needed today  Follow Up Instructions: No follow-ups on file. Follow-up in a week to assess her BP I discussed the assessment and treatment plan with the patient. The patient was provided an opportunity to ask questions, and all were answered. The patient agreed with the plan and demonstrated an understanding of the instructions.   The patient was advised to call back or seek an in-person evaluation if the symptoms worsen or if the condition fails to improve as anticipated.  The above assessment and management plan was discussed with the patient. The patient verbalized understanding of and has agreed to the management plan.   Alvira Monday, FNP

## 2022-06-20 ENCOUNTER — Encounter: Payer: Self-pay | Admitting: *Deleted

## 2022-06-22 ENCOUNTER — Telehealth: Payer: Self-pay | Admitting: Family Medicine

## 2022-06-22 NOTE — Telephone Encounter (Signed)
Pt needs refill on oxyCODONE-acetaminophen (PERCOCET/ROXICET) 5-325 MG tablet   States she will be going out of town this evening, wants meds sent as soon as possible.

## 2022-06-23 ENCOUNTER — Other Ambulatory Visit: Payer: Self-pay | Admitting: Family Medicine

## 2022-06-23 DIAGNOSIS — G8929 Other chronic pain: Secondary | ICD-10-CM

## 2022-06-23 MED ORDER — OXYCODONE-ACETAMINOPHEN 5-325 MG PO TABS: 1.0000 | ORAL_TABLET | Freq: Four times a day (QID) | ORAL | 0 refills | Status: DC | PRN

## 2022-06-23 NOTE — Telephone Encounter (Signed)
Rx sent 

## 2022-06-27 ENCOUNTER — Telehealth: Payer: Self-pay | Admitting: Family Medicine

## 2022-06-27 ENCOUNTER — Other Ambulatory Visit: Payer: Self-pay | Admitting: Family Medicine

## 2022-06-27 DIAGNOSIS — G8929 Other chronic pain: Secondary | ICD-10-CM

## 2022-06-27 NOTE — Telephone Encounter (Signed)
Prescription Request  06/27/2022  LOV: 06/06/2022  What is the name of the medication or equipment? oxyCODONE-acetaminophen (PERCOCET/ROXICET) 5-325 MG tablet   Have you contacted your pharmacy to request a refill? No   Which pharmacy would you like this sent to?  [facility] [address], [place] - [place] [address] Phone: [phone] Fax: [phone]  [id number] [place], [place] [address] Phone: [phone] Fax: [phone]  [id number] [place], [place] [address] Phone: [phone] Fax: [phone]    Patient notified that their request is being sent to the clinical staff for review and that they should receive a response within 2 business days.   Please advise at [facility] [phone]

## 2022-06-28 ENCOUNTER — Other Ambulatory Visit: Payer: Self-pay | Admitting: Family Medicine

## 2022-06-28 ENCOUNTER — Telehealth: Payer: Self-pay | Admitting: Family Medicine

## 2022-06-28 DIAGNOSIS — G8929 Other chronic pain: Secondary | ICD-10-CM

## 2022-06-28 MED ORDER — OXYCODONE-ACETAMINOPHEN 5-325 MG PO TABS: 1.0000 | ORAL_TABLET | Freq: Four times a day (QID) | ORAL | 0 refills | Status: DC | PRN

## 2022-06-28 NOTE — Telephone Encounter (Signed)
Rx sent 

## 2022-06-28 NOTE — Telephone Encounter (Signed)
Pt wants to speak to nurse °

## 2022-06-28 NOTE — Telephone Encounter (Signed)
Pt wants to speak to nurse about medication

## 2022-06-29 ENCOUNTER — Other Ambulatory Visit: Payer: Self-pay

## 2022-06-29 NOTE — Telephone Encounter (Signed)
Thank you :)

## 2022-06-29 NOTE — Telephone Encounter (Signed)
Weight loss doctor changed her bp medication, losartan 100mg .

## 2022-07-03 ENCOUNTER — Telehealth: Payer: Self-pay | Admitting: Family Medicine

## 2022-07-03 NOTE — Telephone Encounter (Signed)
Prescription Request  07/03/2022  LOV: 06/06/2022  What is the name of the medication or equipment? oxyCODONE-acetaminophen (PERCOCET/ROXICET) 5-325 MG tablet   Have you contacted your pharmacy to request a refill? No   Which pharmacy would you like this sent to?  [facility] [address] [place] [address] Phone: [phone] Fax: [phone]  [id number] [place], [place] [address] Phone: [phone] Fax: [phone]  [id number] [place], [place] [address] [place] [address] Phone: [phone] Fax: [phone]    Patient notified that their request is being sent to the clinical staff for review and that they should receive a response within 2 business days.   Please advise at [facility] [phone]

## 2022-07-04 ENCOUNTER — Other Ambulatory Visit: Payer: Self-pay | Admitting: Family Medicine

## 2022-07-04 DIAGNOSIS — G8929 Other chronic pain: Secondary | ICD-10-CM

## 2022-07-04 MED ORDER — OXYCODONE-ACETAMINOPHEN 5-325 MG PO TABS: 1.0000 | ORAL_TABLET | Freq: Four times a day (QID) | ORAL | 0 refills | Status: DC | PRN

## 2022-07-04 NOTE — Telephone Encounter (Signed)
Rx sent 

## 2022-07-07 ENCOUNTER — Telehealth: Payer: Self-pay | Admitting: Family Medicine

## 2022-07-07 ENCOUNTER — Other Ambulatory Visit: Payer: Self-pay | Admitting: Family Medicine

## 2022-07-07 DIAGNOSIS — G8929 Other chronic pain: Secondary | ICD-10-CM

## 2022-07-07 NOTE — Telephone Encounter (Signed)
Prescription Request  07/07/2022  LOV: 06/06/2022  What is the name of the medication or equipment? oxyCODONE-acetaminophen (PERCOCET/ROXICET) 5-325 MG tablet   Have you contacted your pharmacy to request a refill? No   Which pharmacy would you like this sent to?  [facility] [address], [place] - [address] [place] [address] Phone: [phone] Fax: [phone]  [id number] [place], [place] - [address] AT [facility] [address] [place] [address] Phone: [phone] Fax: [phone]  [id number] [place], [place] - [address] [place] [address] Phone: [phone] Fax: [phone]    Patient notified that their request is being sent to the clinical staff for review and that they should receive a response within 2 business days.   Please advise at [facility] [phone]

## 2022-07-10 ENCOUNTER — Other Ambulatory Visit: Payer: Self-pay | Admitting: Family Medicine

## 2022-07-10 DIAGNOSIS — G8929 Other chronic pain: Secondary | ICD-10-CM

## 2022-07-10 MED ORDER — OXYCODONE-ACETAMINOPHEN 5-325 MG PO TABS: 1.0000 | ORAL_TABLET | Freq: Four times a day (QID) | ORAL | 0 refills | Status: DC | PRN

## 2022-07-10 NOTE — Telephone Encounter (Signed)
F/u     1. Which medications need to be refilled? (please list name of each medication and dose if known) oxyCODONE-acetaminophen (PERCOCET/ROXICET) 5-325 MG tablet   2. Which pharmacy/location (including street and city if local pharmacy) is medication [facility] [place]    3. Do they need a 30 day or 90 day supply? 30 day supply

## 2022-07-10 NOTE — Telephone Encounter (Signed)
Rx sent 

## 2022-07-14 ENCOUNTER — Other Ambulatory Visit: Payer: Self-pay | Admitting: Family Medicine

## 2022-07-14 ENCOUNTER — Telehealth: Payer: Self-pay | Admitting: Family Medicine

## 2022-07-14 DIAGNOSIS — G8929 Other chronic pain: Secondary | ICD-10-CM

## 2022-07-14 NOTE — Telephone Encounter (Signed)
Patient called need med refill  oxyCODONE-acetaminophen (PERCOCET/ROXICET) 5-325 MG tablet [[id number]]   [facility]acy  [phone] [place], [place] - [address] [facility] [address], [place] [address] Phone: [phone]  Fax: [phone]

## 2022-07-14 NOTE — Telephone Encounter (Signed)
Fyi   Patient has upcoming pain management appt on 07/25/22 wanted to let MD know.

## 2022-07-15 ENCOUNTER — Other Ambulatory Visit: Payer: Self-pay | Admitting: Family Medicine

## 2022-07-15 DIAGNOSIS — G8929 Other chronic pain: Secondary | ICD-10-CM

## 2022-07-15 MED ORDER — OXYCODONE-ACETAMINOPHEN 5-325 MG PO TABS: 1.0000 | ORAL_TABLET | Freq: Four times a day (QID) | ORAL | 0 refills | Status: DC | PRN

## 2022-07-15 NOTE — Telephone Encounter (Signed)
Rx sent 

## 2022-07-17 NOTE — Telephone Encounter (Signed)
Rx sen ton 07/15/2022

## 2022-07-18 ENCOUNTER — Telehealth: Payer: Self-pay | Admitting: Family Medicine

## 2022-07-18 NOTE — Telephone Encounter (Signed)
oxyCODONE-acetaminophen (PERCOCET/ROXICET) 5-325 MG tablet request refill. [facility]

## 2022-07-18 NOTE — Telephone Encounter (Signed)
PA needed for medication, Prior Berkley Harvey has been started today.

## 2022-07-21 ENCOUNTER — Telehealth: Payer: Self-pay | Admitting: Family Medicine

## 2022-07-21 ENCOUNTER — Other Ambulatory Visit: Payer: Self-pay | Admitting: Family Medicine

## 2022-07-21 DIAGNOSIS — G8929 Other chronic pain: Secondary | ICD-10-CM

## 2022-07-21 MED ORDER — OXYCODONE-ACETAMINOPHEN 5-325 MG PO TABS
1.0000 | ORAL_TABLET | Freq: Four times a day (QID) | ORAL | 0 refills | Status: DC | PRN
Start: 2022-07-21 — End: 2022-07-28

## 2022-07-21 NOTE — Telephone Encounter (Signed)
She takes every 6 hrs as needed for pain has an upcoming visit with pain clinic, getting pain meds from [facility] until she is established with pain.Marland KitchenMarland Kitchen

## 2022-07-21 NOTE — Telephone Encounter (Signed)
Pt called and in question about what pain clinic she was referred to and pains meds. Please follow up.

## 2022-07-21 NOTE — Telephone Encounter (Signed)
Patient called need med refill for this weekend.  oxyCODONE-acetaminophen (PERCOCET/ROXICET) 5-325 MG tablet [[id number]]   [facility]

## 2022-07-21 NOTE — Telephone Encounter (Signed)
Pt is asking for refill on pain medication, asking if you can send this in for her since Malachi Bonds is out?

## 2022-07-21 NOTE — Telephone Encounter (Signed)
Sent!

## 2022-07-21 NOTE — Telephone Encounter (Signed)
Tracie Green filled it on 4/13? She ran out already?

## 2022-07-24 ENCOUNTER — Other Ambulatory Visit: Payer: Self-pay | Admitting: Family Medicine

## 2022-07-24 DIAGNOSIS — G8929 Other chronic pain: Secondary | ICD-10-CM

## 2022-07-25 ENCOUNTER — Ambulatory Visit: Payer: Medicaid Other | Admitting: Family Medicine

## 2022-07-25 ENCOUNTER — Other Ambulatory Visit: Payer: Self-pay | Admitting: Family Medicine

## 2022-07-25 NOTE — Telephone Encounter (Signed)
Was refilled on 07/23/22

## 2022-07-28 ENCOUNTER — Encounter: Payer: Self-pay | Admitting: Family Medicine

## 2022-07-28 ENCOUNTER — Telehealth: Payer: Self-pay | Admitting: Family Medicine

## 2022-07-28 ENCOUNTER — Ambulatory Visit (INDEPENDENT_AMBULATORY_CARE_PROVIDER_SITE_OTHER): Payer: Medicaid Other | Admitting: Family Medicine

## 2022-07-28 VITALS — BP 124/86 | HR 92 | Ht 70.0 in | Wt 367.1 lb

## 2022-07-28 DIAGNOSIS — A499 Bacterial infection, unspecified: Secondary | ICD-10-CM | POA: Diagnosis not present

## 2022-07-28 DIAGNOSIS — Z1612 Extended spectrum beta lactamase (ESBL) resistance: Secondary | ICD-10-CM

## 2022-07-28 DIAGNOSIS — K219 Gastro-esophageal reflux disease without esophagitis: Secondary | ICD-10-CM

## 2022-07-28 DIAGNOSIS — R3915 Urgency of urination: Secondary | ICD-10-CM | POA: Diagnosis not present

## 2022-07-28 DIAGNOSIS — M5440 Lumbago with sciatica, unspecified side: Secondary | ICD-10-CM | POA: Diagnosis not present

## 2022-07-28 DIAGNOSIS — W19XXXA Unspecified fall, initial encounter: Secondary | ICD-10-CM

## 2022-07-28 DIAGNOSIS — N390 Urinary tract infection, site not specified: Secondary | ICD-10-CM | POA: Diagnosis not present

## 2022-07-28 DIAGNOSIS — R1013 Epigastric pain: Secondary | ICD-10-CM

## 2022-07-28 DIAGNOSIS — G8929 Other chronic pain: Secondary | ICD-10-CM

## 2022-07-28 LAB — POCT URINALYSIS DIP (CLINITEK)
Bilirubin, UA: NEGATIVE
Glucose, UA: NEGATIVE mg/dL
Ketones, POC UA: NEGATIVE mg/dL
Nitrite, UA: NEGATIVE
POC PROTEIN,UA: NEGATIVE
Spec Grav, UA: 1.025 (ref 1.010–1.025)
Urobilinogen, UA: 0.2 E.U./dL
pH, UA: 5.5 (ref 5.0–8.0)

## 2022-07-28 MED ORDER — OXYCODONE-ACETAMINOPHEN 5-325 MG PO TABS
1.0000 | ORAL_TABLET | Freq: Four times a day (QID) | ORAL | 0 refills | Status: DC | PRN
Start: 2022-07-28 — End: 2023-11-14

## 2022-07-28 MED ORDER — FOSFOMYCIN TROMETHAMINE 3 G PO PACK
3.0000 g | PACK | Freq: Once | ORAL | 0 refills | Status: AC
Start: 2022-07-28 — End: 2022-07-28

## 2022-07-28 MED ORDER — OMEPRAZOLE 20 MG PO CPDR
20.0000 mg | DELAYED_RELEASE_CAPSULE | Freq: Every day | ORAL | 3 refills | Status: DC
Start: 2022-07-28 — End: 2022-10-23

## 2022-07-28 NOTE — Progress Notes (Signed)
Established Patient Office Visit  Subjective:  Patient ID: Tracie Green, female    DOB: Aug 07, 1975  Age: 47 y.o. MRN: [id number]  CC:  Chief Complaint  Patient presents with   Fall    Pt reports a fall on 07/25/22, reports hurting all over, has a spot on her back from the fall.     HPI Tracie Green is a 47 y.o. female with past medical history of chronic pain syndrome, chronic midline back pain, and essential hypertension presents with complaints of a fall recently on 07/25/2022. For the details of today's visit, please refer to the assessment and plan.     Past Medical History:  Diagnosis Date   Hypertension    PE (pulmonary embolism)     Past Surgical History:  Procedure Laterality Date   CESAREAN SECTION     CHOLECYSTECTOMY  1998   INCISION AND DRAINAGE      History reviewed. No pertinent family history.  Social History   Socioeconomic History   Marital status: Single    Spouse name: Not on file   Number of children: Not on file   Years of education: Not on file   Highest education level: Not on file  Occupational History   Not on file  Tobacco Use   Smoking status: Never   Smokeless tobacco: Never  Substance and Sexual Activity   Alcohol use: No   Drug use: No   Sexual activity: Not on file  Other Topics Concern   Not on file  Social History Narrative   Not on file   Social Determinants of Health   Financial Resource Strain: Not on file  Food Insecurity: No Food Insecurity (01/26/2022)   Hunger Vital Sign    Worried About Running Out of Food in the Last Year: Never true    Ran Out of Food in the Last Year: Never true  Transportation Needs: No Transportation Needs (01/26/2022)   PRAPARE - Administrator, Civil Service (Medical): No    Lack of Transportation (Non-Medical): No  Physical Activity: Not on file  Stress: Not on file  Social Connections: Not on file  Intimate Partner Violence: Not At Risk (01/26/2022)   Humiliation, Afraid,  Rape, and Kick questionnaire    Fear of Current or Ex-Partner: No    Emotionally Abused: No    Physically Abused: No    Sexually Abused: No    Outpatient Medications Prior to Visit  Medication Sig Dispense Refill   atorvastatin (LIPITOR) 40 MG tablet Take 1 tablet (40 mg total) by mouth daily. 30 tablet 1   clotrimazole (LOTRIMIN) 1 % cream Apply 1 Application topically 2 (two) times daily. 30 g 0   dextromethorphan 15 MG/5ML syrup Take 10 mLs (30 mg total) by mouth 4 (four) times daily as needed for cough. 120 mL 0   dicyclomine (BENTYL) 20 MG tablet Take 1 tablet (20 mg total) by mouth 3 (three) times daily as needed for up to 21 doses for spasms. 21 tablet 0   fluticasone (FLONASE) 50 MCG/ACT nasal spray SPRAY 2 SPRAYS INTO EACH NOSTRIL EVERY DAY 48 mL 1   levocetirizine (XYZAL) 5 MG tablet Take 1 tablet (5 mg total) by mouth every evening. 30 tablet 1   losartan (COZAAR) 100 MG tablet Take 100 mg by mouth daily.     naproxen (NAPROSYN) 500 MG tablet Take 1 tablet (500 mg total) by mouth 2 (two) times daily with a meal. 20 tablet 0   ondansetron (  ZOFRAN-ODT) 4 MG disintegrating tablet Take 1 tablet (4 mg total) by mouth every 8 (eight) hours as needed for up to 20 doses for nausea or vomiting. 20 tablet 0   phentermine (ADIPEX-P) 37.5 MG tablet Take 37.5 mg by mouth daily.     PRESCRIPTION MEDICATION darvocet     PROBIOTIC PRODUCT PO Take 1 capsule by mouth daily.     sertraline (ZOLOFT) 25 MG tablet TAKE 1 TABLET (25 MG TOTAL) BY MOUTH DAILY. 90 tablet 2   spironolactone (ALDACTONE) 50 MG tablet Take 50 mg by mouth daily.     tiZANidine (ZANAFLEX) 4 MG tablet TAKE 1 TABLET BY MOUTH EVERY 6 HOURS AS NEEDED FOR MUSCLE SPASMS. 30 tablet 0   topiramate (TOPAMAX) 100 MG tablet Take 1 tablet (100 mg total) by mouth 2 (two) times daily. 30 tablet 1   UNABLE TO FIND Med Name: Right knee brace ICD: W19.XXXA 1 each 0   UNABLE TO FIND Med Name:  metal hinges to support the knee W19.XXA M25.561  1 each 0   oxyCODONE-acetaminophen (PERCOCET/ROXICET) 5-325 MG tablet Take 1 tablet by mouth every 6 (six) hours as needed for severe pain. 15 tablet 0   pantoprazole (PROTONIX) 20 MG tablet Take 1 tablet (20 mg total) by mouth daily. 90 tablet 1   famotidine (PEPCID) 20 MG tablet Take 1 tablet (20 mg total) by mouth daily for 30 doses. (Patient not taking: Reported on 01/23/2022) 30 tablet 0   No facility-administered medications prior to visit.    Allergies  Allergen Reactions   Penicillins Hives and Rash    Hives   Morphine Nausea And Vomiting   Morphine And Related Nausea And Vomiting    ROS Review of Systems  Constitutional:  Negative for chills and fever.  Eyes:  Negative for visual disturbance.  Respiratory:  Negative for chest tightness and shortness of breath.   Gastrointestinal:  Positive for abdominal pain. Negative for blood in stool, constipation, diarrhea, nausea and vomiting.  Genitourinary:  Positive for frequency and urgency. Negative for dysuria.  Musculoskeletal:  Positive for back pain (Chronic).  Neurological:  Negative for dizziness and headaches.      Objective:    Physical Exam HENT:     Head: Normocephalic.     Mouth/Throat:     Mouth: Mucous membranes are moist.  Cardiovascular:     Rate and Rhythm: Normal rate.     Heart sounds: Normal heart sounds.  Pulmonary:     Effort: Pulmonary effort is normal.     Breath sounds: Normal breath sounds.  Abdominal:     Tenderness: There is right CVA tenderness and left CVA tenderness.  Musculoskeletal:     Lumbar back: Tenderness present.     Comments: Tenderness with palpation of the lumbar musculature Positive straight leg test Strength and sensation intact to bilateral upper and lower extremity Full range of motion of the neck  Neurological:     Mental Status: She is alert.     BP 124/86   Pulse 92   Ht 5\' 10"  (1.778 m)   Wt (!) 367 lb 1.9 oz (166.5 kg)   SpO2 97%   BMI 52.68 kg/m  Wt  Readings from Last 3 Encounters:  07/28/22 (!) 367 lb 1.9 oz (166.5 kg)  06/06/22 (!) 374 lb 0.6 oz (169.7 kg)  03/20/22 (!) 385 lb (174.6 kg)    Lab Results  Component Value Date   TSH 1.790 06/06/2022   Lab Results  Component Value  Date   WBC 11.0 (H) 06/06/2022   HGB 13.1 06/06/2022   HCT 40.7 06/06/2022   MCV 84 06/06/2022   PLT 351 06/06/2022   Lab Results  Component Value Date   NA 138 06/06/2022   K 4.5 06/06/2022   CO2 20 06/06/2022   GLUCOSE 89 06/06/2022   BUN 20 06/06/2022   CREATININE 1.34 (H) 06/06/2022   BILITOT 0.4 06/06/2022   ALKPHOS 128 (H) 06/06/2022   AST 17 06/06/2022   ALT 13 06/06/2022   PROT 7.8 06/06/2022   ALBUMIN 4.1 06/06/2022   CALCIUM 9.4 06/06/2022   ANIONGAP 8 01/24/2022   EGFR 50 (L) 06/06/2022   Lab Results  Component Value Date   CHOL 161 06/06/2022   Lab Results  Component Value Date   HDL 62 06/06/2022   Lab Results  Component Value Date   LDLCALC 84 06/06/2022   Lab Results  Component Value Date   TRIG 78 06/06/2022   Lab Results  Component Value Date   CHOLHDL 2.6 06/06/2022   Lab Results  Component Value Date   HGBA1C 5.2 06/06/2022      Assessment & Plan:  Chronic midline low back pain with sciatica, sciatica laterality unspecified Assessment & Plan: Chronic condition We will place a referral in today to physical therapy for chronic lower back pain She is on chronic pain meds and reports being late to her pain management appointment, which is rescheduled for Monday, 07/31/2022 Patient is requesting a refill of her pain medicine today We will provide a short supply of her pain medicine Encouraged the patient to follow-up with pain management as I would not be refilling her pain medicine after today Patient verbalized understanding  Orders: -     DG Lumbar Spine 2-3 Views -     Ambulatory referral to Physical Therapy -     oxyCODONE-Acetaminophen; Take 1 tablet by mouth every 6 (six) hours as needed for  severe pain.  Dispense: 8 tablet; Refill: 0 -     ToxASSURE Select 13 (MW), Urine  Recurrent UTI Assessment & Plan: The patient has been treated for UTI more than 3 times this year She is resistant to multiple antibiotics UA in the clinic shows a trace of leukocytes We will treat today with fosfomycin 3g pack Referral placed to urology  Orders: -     Ambulatory referral to Urology  Urgency of urination -     POCT URINALYSIS DIP (CLINITEK)  ESBL (extended spectrum beta-lactamase) producing bacteria infection -     Fosfomycin Tromethamine; Take 3 g by mouth once for 1 dose.  Dispense: 3 g; Refill: 0  Gastroesophageal reflux disease without esophagitis Assessment & Plan: Reports not taking Protonix 20 mg daily, due to her insurance not covering the medication We will send a prescription for omeprazole 20 mg daily Complaining of heartburn indigestion symptoms Encouraged GERD diet and adherence with treatment regimen  Orders: -     Omeprazole; Take 1 capsule (20 mg total) by mouth daily.  Dispense: 30 capsule; Refill: 3  Epigastric pain Assessment & Plan: Complains of a burning sensation in the epigastric region of her abdomen History of GERD Reported never starting Protonix 20 mg due to her insurance not covering the medication Complains of heartburn indigestion symptoms Complains of urgency and frequency of urination, noting that she is unable to hold her urine UA shows a trace of leukocytes Patient has been treated for UTI more than 3 times this year Will place a referral  to urology today Will treat with monurol 3 g pack Encouraged GERD diet    Falls, initial encounter Assessment & Plan: Reports a recent fall on 07/25/2022 This is the patient's second fall this year She reports falling backward at her daughter's house while stepping down some stairs She complains of body aches and soreness  She reports using her cane while ambulating Will get imaging studies of the  back today Encouraged to continue taking Percocet 5-325 mg Encouraged to take Zanaflex 4 mg as needed    Note: This chart has been completed using Engineer, civil (consulting) software, and while attempts have been made to ensure accuracy, certain words and phrases may not be transcribed as intended.    Follow-up: No follow-ups on file.   Gilmore Laroche, FNP

## 2022-07-28 NOTE — Assessment & Plan Note (Addendum)
Chronic condition We will place a referral in today to physical therapy for chronic lower back pain She is on chronic pain meds and reports being late to her pain management appointment, which is rescheduled for Monday, 07/31/2022 Patient is requesting a refill of her pain medicine today We will provide a short supply of her pain medicine Encouraged the patient to follow-up with pain management as I would not be refilling her pain medicine after today Patient verbalized understanding

## 2022-07-28 NOTE — Assessment & Plan Note (Signed)
Complains of a burning sensation in the epigastric region of her abdomen History of GERD Reported never starting Protonix 20 mg due to her insurance not covering the medication Complains of heartburn indigestion symptoms Complains of urgency and frequency of urination, noting that she is unable to hold her urine UA shows a trace of leukocytes Patient has been treated for UTI more than 3 times this year Will place a referral to urology today Will treat with monurol 3 g pack Encouraged GERD diet

## 2022-07-28 NOTE — Telephone Encounter (Signed)
Prescription Request  07/28/2022  LOV: 06/06/2022  What is the name of the medication or equipment? oxyCODONE-acetaminophen (PERCOCET/ROXICET) 5-325 MG tablet   Have you contacted your pharmacy to request a refill? No   Which pharmacy would you like this sent to?  [facility] [address] [place] - [address] [place] [address] Phone: [phone] Fax: [phone]  [id number] - [place], [place] - [address] AT [facility] [address] [place] [address] Phone: [phone] Fax: [phone]  [id number] - [place], [place] - [address] [place] [address] Phone: [phone] Fax: [phone]    Patient notified that their request is being sent to the clinical staff for review and that they should receive a response within 2 business days.   Please advise at Mobile [phone] (mobile)

## 2022-07-28 NOTE — Patient Instructions (Addendum)
I appreciate the opportunity to provide care to you today!    Follow up:  3 months  Labs:next appt  Please make a conscious effort to make your appointment on Monday with pain management, as I will not be refilling your pain medicine after today.  Your prescription is sent to the pharmacy  Please stop by [facility] to get an x-ray of your lower back  A prescription for omeprazole 20 mg is sent to your pharmacy I recommend GERD diet Lifestyle changes for GERD:  Avoiding foods that trigger symptoms - Some foods also cause relaxation of the lower esophageal sphincter, which can lead to acid reflux. Excessive caffeine, chocolate, alcohol, peppermint, and fatty foods may cause bothersome acid reflux in some people. If you notice that your symptoms are worse after you have certain foods or beverages (trigger foods), it's reasonable to limit or avoid these things.  Avoid certain foods and drinks, such as coffee, chocolate, onions, peppermint, spicy foods, carbonated beverages, citrus fruits, tomatoes, onions, Garlic, alcohol, Fatty foods (bacon, burgers, sausages, steak, fried foods, dairy food)  Recommended: High fiber foods, whole grain cereal, oatmeal, brown rice, root vegetables, non- citrus fruits, High protein foods, Health fats (avocados, olive oil, nuts and seeds)   Referrals today-physical therapy   Please continue to a heart-healthy diet and increase your physical activities. Try to exercise for at least five days a week.      It was a pleasure to see you and I look forward to continuing to work together on your health and well-being. Please do not hesitate to call the office if you need care or have questions about your care.   Have a wonderful day and week. With Gratitude, Gilmore Laroche MSN, FNP-BC

## 2022-07-28 NOTE — Telephone Encounter (Signed)
Pt scheduled for a visit today will be addressed then.

## 2022-07-28 NOTE — Assessment & Plan Note (Signed)
Reports a recent fall on 07/25/2022 This is the patient's second fall this year She reports falling backward at her daughter's house while stepping down some stairs She complains of body aches and soreness  She reports using her cane while ambulating Will get imaging studies of the back today Encouraged to continue taking Percocet 5-325 mg Encouraged to take Zanaflex 4 mg as needed

## 2022-07-28 NOTE — Assessment & Plan Note (Signed)
Reports not taking Protonix 20 mg daily, due to her insurance not covering the medication We will send a prescription for omeprazole 20 mg daily Complaining of heartburn indigestion symptoms Encouraged GERD diet and adherence with treatment regimen

## 2022-07-28 NOTE — Assessment & Plan Note (Signed)
The patient has been treated for UTI more than 3 times this year She is resistant to multiple antibiotics UA in the clinic shows a trace of leukocytes We will treat today with fosfomycin 3g pack Referral placed to urology

## 2022-08-03 LAB — TOXASSURE SELECT 13 (MW), URINE

## 2022-08-10 ENCOUNTER — Telehealth: Payer: Self-pay | Admitting: Family Medicine

## 2022-08-10 NOTE — Telephone Encounter (Signed)
FYI  Pt seeing Dr Randalyn Rhea at [facility] for pain management

## 2022-08-23 ENCOUNTER — Ambulatory Visit (HOSPITAL_COMMUNITY): Payer: Medicaid Other | Admitting: Physical Therapy

## 2022-08-29 ENCOUNTER — Emergency Department (HOSPITAL_COMMUNITY)
Admission: EM | Admit: 2022-08-29 | Discharge: 2022-08-29 | Disposition: A | Discharge status: Home / Self Care | Payer: Medicaid Other | Attending: Emergency Medicine | Admitting: Emergency Medicine

## 2022-08-29 ENCOUNTER — Other Ambulatory Visit: Payer: Self-pay

## 2022-08-29 ENCOUNTER — Encounter (HOSPITAL_COMMUNITY): Payer: Self-pay | Admitting: Emergency Medicine

## 2022-08-29 ENCOUNTER — Emergency Department (HOSPITAL_COMMUNITY): Payer: Medicaid Other

## 2022-08-29 DIAGNOSIS — R8289 Other abnormal findings on cytological and histological examination of urine: Secondary | ICD-10-CM | POA: Insufficient documentation

## 2022-08-29 DIAGNOSIS — R112 Nausea with vomiting, unspecified: Secondary | ICD-10-CM | POA: Insufficient documentation

## 2022-08-29 DIAGNOSIS — Z79899 Other long term (current) drug therapy: Secondary | ICD-10-CM | POA: Diagnosis not present

## 2022-08-29 DIAGNOSIS — R1012 Left upper quadrant pain: Secondary | ICD-10-CM | POA: Diagnosis not present

## 2022-08-29 DIAGNOSIS — K625 Hemorrhage of anus and rectum: Secondary | ICD-10-CM | POA: Insufficient documentation

## 2022-08-29 DIAGNOSIS — R197 Diarrhea, unspecified: Secondary | ICD-10-CM | POA: Insufficient documentation

## 2022-08-29 LAB — COMPREHENSIVE METABOLIC PANEL
ALT: 33 U/L (ref 0–44)
AST: 20 U/L (ref 15–41)
Albumin: 3.5 g/dL (ref 3.5–5.0)
Alkaline Phosphatase: 129 U/L — ABNORMAL HIGH (ref 38–126)
Anion gap: 10 (ref 5–15)
BUN: 20 mg/dL (ref 6–20)
CO2: 21 mmol/L — ABNORMAL LOW (ref 22–32)
Calcium: 9 mg/dL (ref 8.9–10.3)
Chloride: 106 mmol/L (ref 98–111)
Creatinine, Ser: 1.1 mg/dL — ABNORMAL HIGH (ref 0.44–1.00)
GFR, Estimated: >60 mL/min (ref >60)
Glucose, Bld: 86 mg/dL (ref 70–99)
Potassium: 4.2 mmol/L (ref 3.5–5.1)
Sodium: 137 mmol/L (ref 135–145)
Total Bilirubin: 0.6 mg/dL (ref 0.3–1.2)
Total Protein: 7.6 g/dL (ref 6.5–8.1)

## 2022-08-29 LAB — URINALYSIS, ROUTINE W REFLEX MICROSCOPIC
Bilirubin Urine: NEGATIVE
Glucose, UA: NEGATIVE mg/dL
Ketones, ur: NEGATIVE mg/dL
Leukocytes,Ua: NEGATIVE
Nitrite: NEGATIVE
Protein, ur: NEGATIVE mg/dL
Specific Gravity, Urine: 1.019 (ref 1.005–1.030)
pH: 5 (ref 5.0–8.0)

## 2022-08-29 LAB — CBC
HCT: 38.5 % (ref 36.0–46.0)
Hemoglobin: 12.1 g/dL (ref 12.0–15.0)
MCH: 27.2 pg (ref 26.0–34.0)
MCHC: 31.4 g/dL (ref 30.0–36.0)
MCV: 86.5 fL (ref 80.0–100.0)
Platelets: 302 10*3/uL (ref 150–400)
RBC: 4.45 MIL/uL (ref 3.87–5.11)
RDW: 13.7 % (ref 11.5–15.5)
WBC: 8.1 10*3/uL (ref 4.0–10.5)
nRBC: 0 % (ref 0.0–0.2)

## 2022-08-29 LAB — LIPASE, BLOOD: Lipase: 35 U/L (ref 11–51)

## 2022-08-29 LAB — POC URINE PREG, ED: Preg Test, Ur: NEGATIVE

## 2022-08-29 MED ORDER — FAMOTIDINE IN NACL 20-0.9 MG/50ML-% IV SOLN
20.0000 mg | Freq: Once | INTRAVENOUS | Status: AC
  Administered 2022-08-29: 20 mg via INTRAVENOUS
  Filled 2022-08-29: qty 50

## 2022-08-29 MED ORDER — ONDANSETRON 4 MG PO TBDP: 4.0000 mg | ORAL_TABLET | Freq: Four times a day (QID) | ORAL | 0 refills | Status: AC | PRN

## 2022-08-29 MED ORDER — LACTATED RINGERS IV BOLUS
1000.0000 mL | Freq: Once | INTRAVENOUS | Status: AC
  Administered 2022-08-29: 1000 mL via INTRAVENOUS

## 2022-08-29 MED ORDER — ONDANSETRON HCL 4 MG/2ML IJ SOLN
4.0000 mg | Freq: Once | INTRAMUSCULAR | Status: AC
  Administered 2022-08-29: 4 mg via INTRAVENOUS
  Filled 2022-08-29: qty 2

## 2022-08-29 MED ORDER — FENTANYL CITRATE PF 50 MCG/ML IJ SOSY
50.0000 ug | PREFILLED_SYRINGE | Freq: Once | INTRAMUSCULAR | Status: AC
  Administered 2022-08-29: 50 ug via INTRAVENOUS
  Filled 2022-08-29: qty 1

## 2022-08-29 MED ORDER — IOHEXOL 300 MG/ML  SOLN
100.0000 mL | Freq: Once | INTRAMUSCULAR | Status: AC | PRN
  Administered 2022-08-29: 100 mL via INTRAVENOUS

## 2022-08-29 MED ORDER — FAMOTIDINE 20 MG PO TABS: 20.0000 mg | ORAL_TABLET | Freq: Every day | ORAL | 0 refills | Status: AC

## 2022-08-29 NOTE — Discharge Instructions (Addendum)
Pleasure taking care of you today.  You were evaluated for 3 days of nausea vomiting and diarrhea with some blood mixed in with your stool for the past 2 days.  Your blood counts are normal, your CT scan was reassuring.  It did show some kidney stones but they are not currently causing any problems. I understand you have been having some longstanding GI issues and has had trouble following up with a GI doctor.  You are provided with the phone number for the GI doctor call to try to arrange follow-up.  Given prescription for some nausea medicine and some antacid medicine to help with your symptoms as well.  If you have fever, persistent vomiting, worsening abdominal pain or any other new or worsening symptoms, please come back to the ER.  Please also come back if you have increased amount of blood in your stool, develop dizziness or weakness or any other worrisome changes. At this time there is no indication to admit you to the hospital.  Avoid spicy or fatty foods, drink plenty of fluids.

## 2022-08-29 NOTE — ED Triage Notes (Signed)
Pt c/o n/v x 3 days. No known sick contacts. Upper abd pain rated 9/10 cramping and stabbing, intermittent. Pt thinks she may have had blood in her stool this morning.

## 2022-08-29 NOTE — ED Provider Triage Note (Signed)
Emergency Medicine Provider Triage Evaluation Note  Alianah Schellin , a 47 y.o. female  was evaluated in triage.  Pt complains of nausea and diarrhea that started on Saturday, progressed yesterday to vomiting as well, still having diarrhea.  Today had small bit of blood mixed in with the diarrhea.  She denies fevers, no chest pain or back pain, prior surgical history of cholecystectomy.  She also has upper abdominal pain  Review of Systems  Positive: Nausea vomiting diarrhea and abdominal pain Negative: Dysuria, fever  Physical Exam  BP (!) 146/84 (BP Location: Right Arm)   Pulse 79   Temp 98.6 F (37 C) (Oral)   Resp 20   Ht 5\' 10"  (1.778 m)   Wt (!) 166.5 kg   LMP 08/17/2022 (Approximate)   SpO2 99%   BMI 52.66 kg/m  Gen:   Awake, no distress   Resp:  Normal effort  MSK:   Moves extremities without difficulty  Other:    Medical Decision Making  Medically screening exam initiated at 4:22 PM.  Appropriate orders placed.  Kanyiah Stefanek was informed that the remainder of the evaluation will be completed by another provider, this initial triage assessment does not replace that evaluation, and the importance of remaining in the ED until their evaluation is complete.     Ma Rings, [place] 08/29/22 1627

## 2022-08-29 NOTE — ED Provider Notes (Signed)
[place] EMERGENCY DEPARTMENT AT [facility] Provider Note   CSN: [id number] Arrival date & time: 08/29/22  1140     History  Chief Complaint  Patient presents with   Emesis    Tracie Green is a 47 y.o. female.Pt complains of nausea and diarrhea that started on Saturday, progressed yesterday to vomiting as well, still having diarrhea.  Today had small bit of blood mixed in with the diarrhea.  She denies fevers, no chest pain or back pain, prior surgical history of cholecystectomy.  She also has upper abdominal pain.   States that blood was reviewed size of a quarter and red in color.  Denies mucus in her stool, no hematemesis.  No melena.  No dizziness or syncope.  She reports she is out of Zofran at home for her nausea.  She reports she has chronic abdominal problems, her PCP has tried to get her into see GI but she has not received a call for an appointment yet.     Emesis      Home Medications Prior to Admission medications   Medication Sig Start Date End Date Taking? Authorizing Provider  ondansetron (ZOFRAN-ODT) 4 MG disintegrating tablet Take 1 tablet (4 mg total) by mouth every 6 (six) hours as needed for nausea or vomiting. 08/29/22  Yes Stockton Nunley A, PA-C  atorvastatin (LIPITOR) 40 MG tablet Take 1 tablet (40 mg total) by mouth daily. 01/27/22   Elmer Picker, NP  clotrimazole (LOTRIMIN) 1 % cream Apply 1 Application topically 2 (two) times daily. 02/20/22   Gardenia Phlegm, MD  dextromethorphan 15 MG/5ML syrup Take 10 mLs (30 mg total) by mouth 4 (four) times daily as needed for cough. 02/20/22   Gardenia Phlegm, MD  dicyclomine (BENTYL) 20 MG tablet Take 1 tablet (20 mg total) by mouth 3 (three) times daily as needed for up to 21 doses for spasms. 01/18/22   Terald Sleeper, MD  famotidine (PEPCID) 20 MG tablet Take 1 tablet (20 mg total) by mouth daily for 30 doses. 08/29/22 09/28/22  Carmel Sacramento A, PA-C  fluticasone (FLONASE) 50 MCG/ACT nasal spray  SPRAY 2 SPRAYS INTO EACH NOSTRIL EVERY DAY 05/23/22   Gilmore Laroche, FNP  levocetirizine (XYZAL) 5 MG tablet Take 1 tablet (5 mg total) by mouth every evening. 06/02/22   Gilmore Laroche, FNP  losartan (COZAAR) 100 MG tablet Take 100 mg by mouth daily. 06/26/22   [provider]  naproxen (NAPROSYN) 500 MG tablet Take 1 tablet (500 mg total) by mouth 2 (two) times daily with a meal. 03/17/22   Gilmore Laroche, FNP  omeprazole (PRILOSEC) 20 MG capsule Take 1 capsule (20 mg total) by mouth daily. 07/28/22   Gilmore Laroche, FNP  oxyCODONE-acetaminophen (PERCOCET/ROXICET) 5-325 MG tablet Take 1 tablet by mouth every 6 (six) hours as needed for severe pain. 07/28/22   Gilmore Laroche, FNP  phentermine (ADIPEX-P) 37.5 MG tablet Take 37.5 mg by mouth daily. 05/29/22   [provider]  PRESCRIPTION MEDICATION darvocet    [provider]  PROBIOTIC PRODUCT PO Take 1 capsule by mouth daily.    [provider]  sertraline (ZOLOFT) 25 MG tablet TAKE 1 TABLET (25 MG TOTAL) BY MOUTH DAILY. 04/10/22   Gilmore Laroche, FNP  spironolactone (ALDACTONE) 50 MG tablet Take 50 mg by mouth daily. 06/26/22   [provider]  tiZANidine (ZANAFLEX) 4 MG tablet TAKE 1 TABLET BY MOUTH EVERY 6 HOURS AS NEEDED FOR MUSCLE SPASMS. 07/25/22  Gilmore Laroche, FNP  topiramate (TOPAMAX) 100 MG tablet Take 1 tablet (100 mg total) by mouth 2 (two) times daily. 01/26/22   Elmer Picker, NP  UNABLE TO FIND Med Name: Right knee brace ICD: W19.Lorne Skeens 06/09/22   Gilmore Laroche, FNP  UNABLE TO FIND Med Name:  metal hinges to support the knee W19.XXA M25.561 06/12/22   Gilmore Laroche, FNP      Allergies    Penicillins, Morphine, and Morphine and codeine    Review of Systems   Review of Systems  Gastrointestinal:  Positive for vomiting.    Physical Exam Updated Vital Signs BP 139/72   Pulse 80   Temp (!) 97.5 F (36.4 C)   Resp 18   Ht 5\' 10"  (1.778 m)   Wt (!) 166.5 kg   LMP 08/17/2022  (Approximate)   SpO2 100%   BMI 52.66 kg/m  Physical Exam Vitals and nursing note reviewed.  Constitutional:      General: She is not in acute distress.    Appearance: She is well-developed.  HENT:     Head: Normocephalic and atraumatic.  Eyes:     Conjunctiva/sclera: Conjunctivae normal.  Cardiovascular:     Rate and Rhythm: Normal rate and regular rhythm.     Heart sounds: No murmur heard. Pulmonary:     Effort: Pulmonary effort is normal. No respiratory distress.     Breath sounds: Normal breath sounds.  Abdominal:     Palpations: Abdomen is soft.     Tenderness: There is abdominal tenderness in the left upper quadrant. There is no guarding or rebound. Negative signs include Murphy's sign and McBurney's sign.  Musculoskeletal:        General: No swelling.     Cervical back: Neck supple.  Skin:    General: Skin is warm and dry.     Capillary Refill: Capillary refill takes less than 2 seconds.  Neurological:     General: No focal deficit present.     Mental Status: She is alert and oriented to person, place, and time.     Motor: No weakness.  Psychiatric:        Mood and Affect: Mood normal.     ED Results / Procedures / Treatments   Labs (all labs ordered are listed, but only abnormal results are displayed) Labs Reviewed  COMPREHENSIVE METABOLIC PANEL - Abnormal; Notable for the following components:      Result Value   CO2 21 (*)    Creatinine, Ser 1.10 (*)    Alkaline Phosphatase 129 (*)    All other components within normal limits  URINALYSIS, ROUTINE W REFLEX MICROSCOPIC - Abnormal; Notable for the following components:   APPearance HAZY (*)    Hgb urine dipstick SMALL (*)    Bacteria, UA RARE (*)    All other components within normal limits  LIPASE, BLOOD  CBC  POC URINE PREG, ED    EKG None  Radiology CT ABDOMEN PELVIS W CONTRAST  Result Date: 08/29/2022 CLINICAL DATA:  Acute abdominal pain EXAM: CT ABDOMEN AND PELVIS WITH CONTRAST TECHNIQUE:  Multidetector CT imaging of the abdomen and pelvis was performed using the standard protocol following bolus administration of intravenous contrast. RADIATION DOSE REDUCTION: This exam was performed according to the departmental dose-optimization program which includes automated exposure control, adjustment of the mA and/or kV according to patient size and/or use of iterative reconstruction technique. CONTRAST:  OMNIPAQUE IOHEXOL 300 MG/ML  SOLN COMPARISON:  01/18/2022 FINDINGS: Lower chest: No acute  abnormality. Hepatobiliary: No focal liver abnormality is seen. Status post cholecystectomy. No biliary dilatation. Pancreas: Unremarkable. No pancreatic ductal dilatation or surrounding inflammatory changes. Spleen: Normal in size without focal abnormality. Adrenals/Urinary Tract: Adrenal glands are within normal limits. Kidneys are well visualized within enhancement pattern. No obstructive changes are seen. Small nonobstructing stones are noted on the left measuring up to 3 mm in the lower pole. Ureters are within normal limits. The bladder is partially distended. Stomach/Bowel: The appendix is within normal limits. No obstructive or inflammatory changes of the colon are seen. Small bowel and stomach are unremarkable. Vascular/Lymphatic: No significant vascular findings are present. No enlarged abdominal or pelvic lymph nodes. Reproductive: Uterus and bilateral adnexa are unremarkable. Other: No abdominal wall hernia or abnormality. No abdominopelvic ascites. Musculoskeletal: Degenerative changes of lumbar spine are noted. IMPRESSION: Nonobstructing left renal stones are noted. No other focal abnormality is seen. Electronically Signed   By: Alcide Clever M.D.   On: 08/29/2022 18:27    Procedures Procedures    Medications Ordered in ED Medications  ondansetron (ZOFRAN) injection 4 mg (4 mg Intravenous Given 08/29/22 1735)  lactated ringers bolus 1,000 mL (1,000 mLs Intravenous New Bag/Given 08/29/22 1736)   famotidine (PEPCID) IVPB 20 mg premix (0 mg Intravenous Stopped 08/29/22 1844)  iohexol (OMNIPAQUE) 300 MG/ML solution 100 mL (100 mLs Intravenous Contrast Given 08/29/22 1807)  fentaNYL (SUBLIMAZE) injection 50 mcg (50 mcg Intravenous Given 08/29/22 1845)    ED Course/ Medical Decision Making/ A&P                             Medical Decision Making This patient presents to the ED for concern of nausea vomiting and diarrhea, so a lot of blood in the stool and abdominal pain x 3 days, this involves an extensive number of treatment options, and is a complaint that carries with it a high risk of complications and morbidity.  The differential diagnosis includes gastritis, gastroenteritis, colitis, appendicitis, pancreatitis, esophagitis, GERD, other   Co morbidities that complicate the patient evaluation  GERD, hypertension chronic back pain   Additional history obtained:  Additional history obtained from EMR External records from outside source obtained and reviewed including prior labs and PCP notes   Lab Tests:  I Ordered, and personally interpreted labs.  The pertinent results include: CBC, CMP, lipase are all reassuring.  Creatinine is at her baseline of 1.1.  Urinalysis did show 11-20 red blood cells, patient has no bacteria or leukocytes or white blood cells to suggest infection.  Informed of this finding, possibly due to CT findings of nephrolithiasis.   Imaging Studies ordered:  I ordered imaging studies including CT abdomen pelvis I independently visualized and interpreted imaging which showed no acute process I agree with the radiologist interpretation     Problem List / ED Course / Critical interventions / Medication management  Patient is having 3 days of nausea vomiting diarrhea, had 2 days of some blood mixed in her stool, this is a total of 3 episodes and states that mild was about the size of a quarter.  Hemoglobin is at her baseline of 12.  She is not weak or  dizzy, she is not tachycardic.  She was not able to give stool sample here in the ED.  She states she has been having chronic abdominal problems with a lot of nausea and intermittent diarrhea, not able to get into GI as an outpatient yet.  CT ordered  to rule out colitis, intra-abdominal abscess or other acute findings, this was negative but did show some nephrolithiasis, patient informed of findings. Overall her workup has been reassuring.  I discussed findings with her and plan to discharge home and she was given strict return precautions.  I offered rectal exam but patient declined.  Deferred to GI follow-up.  I do not feel rectal exam would drastically change my management with this patient she does not have any rectal pain to suggest hemorrhoids or fissure.  Even if she does have some blood on the exam she has stable vitals and stable hemoglobin with no ongoing symptoms and ED I ordered medication including Zofran, IV fluids, Pepcid and Tylenol for pain nausea vomiting Reevaluation of the patient after these medicines showed that the patient improved I have reviewed the patients home medicines and have made adjustments as needed  Amount and/or Complexity of Data Reviewed Labs: ordered. Radiology: ordered.  Risk Prescription drug management.           Final Clinical Impression(s) / ED Diagnoses Final diagnoses:  Nausea vomiting and diarrhea  Rectal bleeding    Rx / DC Orders ED Discharge Orders          Ordered    ondansetron (ZOFRAN-ODT) 4 MG disintegrating tablet  Every 6 hours PRN        08/29/22 1901    famotidine (PEPCID) 20 MG tablet  Daily        08/29/22 1901              Josem Kaufmann 08/29/22 1908    Terrilee Files, MD 08/30/22 1009

## 2022-09-05 ENCOUNTER — Ambulatory Visit (HOSPITAL_COMMUNITY): Payer: Medicaid Other | Admitting: Physical Therapy

## 2022-09-08 ENCOUNTER — Telehealth: Payer: Self-pay

## 2022-09-08 ENCOUNTER — Ambulatory Visit: Payer: Medicaid Other | Admitting: Urology

## 2022-09-08 DIAGNOSIS — E669 Obesity, unspecified: Secondary | ICD-10-CM | POA: Insufficient documentation

## 2022-09-08 DIAGNOSIS — N200 Calculus of kidney: Secondary | ICD-10-CM | POA: Insufficient documentation

## 2022-09-08 DIAGNOSIS — F1491 Cocaine use, unspecified, in remission: Secondary | ICD-10-CM | POA: Insufficient documentation

## 2022-09-08 NOTE — Telephone Encounter (Signed)
Patient was not able to make appt today due to her brother passing away suddenly. I know your 11:30 appointments are for office visits. I confirmed with the patient that she does not have a catheter and wont need a VT. Are we able to put her for a sooner appt at one of those 11:30 time slots.

## 2022-09-08 NOTE — Progress Notes (Deleted)
History of Present Illness: Ms. Lattin is a 47 y.o. female who presents today as a new patient at [facility] [place]. All available relevant medical records have been reviewed.  - Past medical history includes HTN, HLD, prior PE, chronic pain syndrome, chronic midline back pain, obesity. - GU/GYN History: 1. Kidney stones. - Prior ureteroscopic stone manipulation with stent placement in 2021 by Dr. Malen Gauze at [facility]. - Per Dr. Mercy Riding note on 04/09/2020: "She poorly tolerates stone passage with difficulty in controlling pain and poorly tolerates stent."  - 08/29/2022: CT abdomen/pelvis w/ contrast showed "Small nonobstructing stones are noted on the left measuring up to 3 mm in the lower pole." 2. Needs I&O cath for clean urine specimen:  - Per Dr. Mercy Riding note on 04/09/2020: "her voided specimen is completely contaminated. Nursing has noted with the catheterized urine attempt that she is not clean in the area and it does not appear that she can reach to wipe stool bowel movement."   Today She reports chief complaint of recurrent UTls.   Urine culture results in past 12 months: - 01/18/2022: Positive for >100k E. Coli ESBL; multi-drug resistant organism - unresponsive to most typical oral antibiotics except Nitrofurantoin per culture sensitivities. Was prescribed Bactrim DS.  - 01/23/2022: Positive for >100k E. Coli ESBL; multi-drug resistant organism - unresponsive to most typical oral antibiotics except Nitrofurantoin per culture sensitivities. Was prescribed Macrobid. - 02/20/2022: No UA or culture result found per chart review; was prescribed Macrobid for suspected UTI via telemedicine. - 05/23/2022: No UA or culture result found per chart review; PCP prescribed Bactrim for possible UTI per patient phone call. - 06/09/2022: Positive for >100k E. Coli; multi-drug resistant organism - unresponsive to typical oral antibiotics per culture sensitivities. Was prescribed Macrobid  empirically at first then switched to Fosfomycin 3 gram pack based on culture sensitivities.   - 07/28/2022: No culture result found per chart review; PCP prescribed Fosfomycin 3 gram for UTI based on finding of trace leukocytes in UA per visit note.  Urinary Symptoms: She reports *** UTl's in the last year. When present, UTI symptoms include ***dysuria, ***increased urinary urgency, ***frequency, ***. She reports that UTI symptoms do ***not seem to correlate with intercourse. She {Actions; denies-reports:120008} acute UTI symptoms today.  At baseline: She {Actions; denies-reports:120008} urinary urgency, frequency, dysuria, gross hematuria, hesitancy, straining to void, or sensations of incomplete emptying. She reports voiding *** times per day and *** times at night. She {Actions; denies-reports:120008} pushing on a bulge in order to empty her bladder.  She {Actions; denies-reports:120008} urge incontinence. She {Actions; denies-reports:120008} stress incontinence with ***cough/***laugh/***sneeze/***heavy lifting/***exercise. She {Actions; denies-reports:120008} enuresis. She leaks *** times per ***. Wears *** ***pads / ***diapers per day. She states the ***SUI / ***UUI is predominant. This has been going on for {NUMBERS 1-12:18279} {days/wks/mos/yrs:310907} and {ACTION; IS/IS WGN:56213086} significantly bothersome. In terms of treatment, She has tried ***.  She {Actions; denies-reports:120008} caffeine intake.  She {Actions; denies-reports:120008} history of pyelonephritis.  She {Actions; denies-reports:120008} history of kidney stones.  Vaginal / prolapse Symptoms: She {Actions; denies-reports:120008} a feeling of a bulge the vaginal area.  She {Actions; denies-reports:120008} seeing a bulge in the vaginal area. This bulge is bothersome and is the size of ***. It was first noticed ***.  She {Actions; denies-reports:120008} vaginal pain, bleeding, or discharge.  She {Actions;  denies-reports:120008} use of topical vaginal estrogen cream.  Bowel Symptoms: She has a continent bowel movement *** time(s) per ***. Typical Bristol stool scale of continent  bowel episodes is Type ***. She {Actions; denies-reports:120008} Fl episodes. Fecal incontinence started *** and occurs *** times per week. Typical Bristol stool scale of incontinent bowel episodes is Type ***. She {Actions; denies-reports:120008} straining and {Actions; denies-reports:120008} splinting to defecate. She {Actions; denies-reports:120008} rectal bleeding. She {Actions; denies-reports:120008} feeling like she fully empties her rectum with defecation. She {Actions; denies-reports:120008} urgency with defecation. She {Actions; denies-reports:120008} difficulty wiping clean. She {Actions; denies-reports:120008} taking laxatives, stool softeners, or fiber supplements. Last colonoscopy was***.  Past OB/GYN History: OB History   No obstetric history on file.    She {Actions; denies-reports:120008} being sexually active.  She {Actions; denies-reports:120008} dyspareunia. ***Dyspareunia is located ***at the introitus / ***deep inside the vagina. She has*** child(ren) who were delivered ***vaginally ***via c-section. She {Actions; denies-reports:120008} significant tearing with ***delivery / ***deliveries. She is {DESC; PRE/POST:32304} menopausal.  Last pap smear was ***. She {Actions; denies-reports:120008} history of *** hysterectomy.  Fall Screening: Do you usually have a device to assist in your mobility? {yes/no:20286} (***cane / ***walker / ***wheelchair)  Medications: Current Outpatient Medications  Medication Sig Dispense Refill   atorvastatin (LIPITOR) 40 MG tablet Take 1 tablet (40 mg total) by mouth daily. 30 tablet 1   clotrimazole (LOTRIMIN) 1 % cream Apply 1 Application topically 2 (two) times daily. 30 g 0   dextromethorphan 15 MG/5ML syrup Take 10 mLs (30 mg total) by mouth 4 (four) times daily as  needed for cough. 120 mL 0   dicyclomine (BENTYL) 20 MG tablet Take 1 tablet (20 mg total) by mouth 3 (three) times daily as needed for up to 21 doses for spasms. 21 tablet 0   famotidine (PEPCID) 20 MG tablet Take 1 tablet (20 mg total) by mouth daily for 30 doses. 30 tablet 0   fluticasone (FLONASE) 50 MCG/ACT nasal spray SPRAY 2 SPRAYS INTO EACH NOSTRIL EVERY DAY 48 mL 1   levocetirizine (XYZAL) 5 MG tablet Take 1 tablet (5 mg total) by mouth every evening. 30 tablet 1   losartan (COZAAR) 100 MG tablet Take 100 mg by mouth daily.     naproxen (NAPROSYN) 500 MG tablet Take 1 tablet (500 mg total) by mouth 2 (two) times daily with a meal. 20 tablet 0   omeprazole (PRILOSEC) 20 MG capsule Take 1 capsule (20 mg total) by mouth daily. 30 capsule 3   ondansetron (ZOFRAN-ODT) 4 MG disintegrating tablet Take 1 tablet (4 mg total) by mouth every 6 (six) hours as needed for nausea or vomiting. 15 tablet 0   oxyCODONE-acetaminophen (PERCOCET/ROXICET) 5-325 MG tablet Take 1 tablet by mouth every 6 (six) hours as needed for severe pain. 8 tablet 0   phentermine (ADIPEX-P) 37.5 MG tablet Take 37.5 mg by mouth daily.     PRESCRIPTION MEDICATION darvocet     PROBIOTIC PRODUCT PO Take 1 capsule by mouth daily.     sertraline (ZOLOFT) 25 MG tablet TAKE 1 TABLET (25 MG TOTAL) BY MOUTH DAILY. 90 tablet 2   spironolactone (ALDACTONE) 50 MG tablet Take 50 mg by mouth daily.     tiZANidine (ZANAFLEX) 4 MG tablet TAKE 1 TABLET BY MOUTH EVERY 6 HOURS AS NEEDED FOR MUSCLE SPASMS. 30 tablet 0   topiramate (TOPAMAX) 100 MG tablet Take 1 tablet (100 mg total) by mouth 2 (two) times daily. 30 tablet 1   UNABLE TO FIND Med Name: Right knee brace ICD: W19.XXXA 1 each 0   UNABLE TO FIND Med Name:  metal hinges to support the knee W19.XXA M25.561 1  each 0   No current facility-administered medications for this visit.    Allergies: Allergies  Allergen Reactions   Penicillins Hives and Rash    Hives   Morphine Nausea  And Vomiting   Morphine And Codeine Nausea And Vomiting    Past Medical History:  Diagnosis Date   Hypertension    PE (pulmonary embolism)    Past Surgical History:  Procedure Laterality Date   CESAREAN SECTION     CHOLECYSTECTOMY  1998   INCISION AND DRAINAGE     No family history on file. Social History   Socioeconomic History   Marital status: Legally Separated    Spouse name: Not on file   Number of children: Not on file   Years of education: Not on file   Highest education level: Not on file  Occupational History   Not on file  Tobacco Use   Smoking status: Never   Smokeless tobacco: Never  Substance and Sexual Activity   Alcohol use: No   Drug use: No   Sexual activity: Not on file  Other Topics Concern   Not on file  Social History Narrative   Not on file   Social Determinants of Health   Financial Resource Strain: Not on file  Food Insecurity: No Food Insecurity (01/26/2022)   Hunger Vital Sign    Worried About Running Out of Food in the Last Year: Never true    Ran Out of Food in the Last Year: Never true  Transportation Needs: No Transportation Needs (01/26/2022)   PRAPARE - Administrator, Civil Service (Medical): No    Lack of Transportation (Non-Medical): No  Physical Activity: Not on file  Stress: Not on file  Social Connections: Not on file  Intimate Partner Violence: Not At Risk (01/26/2022)   Humiliation, Afraid, Rape, and Kick questionnaire    Fear of Current or Ex-Partner: No    Emotionally Abused: No    Physically Abused: No    Sexually Abused: No     SUBJECTIVE  Review of Systems Constitutional: Patient ***denies any unintentional weight loss or change in strength lntegumentary: Patient ***denies any rashes or pruritus Eyes: Patient denies ***dry eyes ENT: Patient ***denies dry mouth Cardiovascular: Patient ***denies chest pain or syncope Respiratory: Patient ***denies shortness of breath Gastrointestinal: Patient  ***denies nausea, vomiting, constipation, or diarrhea Musculoskeletal: Patient ***denies muscle cramps or weakness Neurologic: Patient ***denies convulsions or seizures Psychiatric: Patient ***denies memory problems Allergic/Immunologic: Patient ***denies recent allergic reaction(s) Hematologic/Lymphatic: Patient denies bleeding tendencies Endocrine: Patient ***denies heat/cold intolerance  GU: As per HPI.  OBJECTIVE There were no vitals filed for this visit. There is no height or weight on file to calculate BMI.  Physical Examination  Constitutional: General appearance: Well nourished, well developed in no acute distress. Psych: Normal mood and affect, alert and oriented x 3 Neck: Supple, normal appearance.  Respiratory: Normal respiratory effort.  Cardiovascular: No lower extremity edema.  Skin: Warm and dry, no lesions. Lymphatic: No inguinal lymphadenopathy. Abdomen: No masses. No abdominal tenderness. No hernias. No hepatosplenomegaly. No guarding or rebound. Surgical scars ***Pfannensteil / ***laparoscopic port sites/***.  Detailed Urogynecologic Evaluation: Pelvic Exam: External genitalia  {desc; normal/abnormal:32111} in appearance and {Desc; negative/positive:13464} for erythema, lesions or discharge. Bartholin's and Skene's glands  in appearance. She has  sensation to light touch. ***She has an {Desc; intact/impaired:30302} anal wink. There {ACTION; IS/IS NOT:21021397} stool contamination of the perineum noted. Urethral meatus midline and {Desc; negative/positive:13464}  for discharge, polyps, prolapse, caruncles, masses,  or erythema. Urethra {Desc; negative/positive:13464}  for tenderness, masses, hypermobility, or leak with cough.  Bladder {Desc; negative/positive:13464}  for tenderness or distention. Vagina {Desc; negative/positive:13464}  for discharge, irritation or lesions. ***Atrophic. ***No prolapse. ***Levator diastasis. Uterus {Desc; negative/positive:13464}  for  masses, enlargement or tenderness. ****Surgically absent.  Adnexa {Desc; negative/positive:13464}  for masses or tenderness.  Pelvic floor {ACTION; IS/IS ZOX:09604540} hypertonic. Pelvic floor {ACTION; IS/IS NOT:21021397} tender to palpation.  POP-Q EXAM  Aa: ***  Ba: ***  Ap: ***  Bp: ***  C: ***  D: ***  Gh: ***  Pb: ***  TVL: ***  Staging  ***    Rectal Exam: Anal verge looks {desc; normal/abnormal:32111} {With/without:5700} hemorrhoids. Resting anal sphincter tone appears {desc; normal/abnormal:32111}. Squeeze sphincter tone appears {desc; normal/abnormal:32111}.  Enterocele / distal rectocele {DESC; PRESENT/ABSENT:17923}. Rectal masses {DESC; PRESENT/ABSENT:17923}. Dyssynergia {DESC; PRESENT/ABSENT:17923} when asking the patient to bear down.   Pelvic exam was chaperoned by ***. ***Chaperone offered for pelvic exam; patient declined.  UA (***I&O cath): {Desc; negative/positive:13464} *** WBC/hpf, *** RBC/hpf, bacteria (***) *** nitrites, *** leukocytes, *** blood PVR: *** ml  ASSESSMENT Recurrent UTI Recurrent UTI with MDRO E. coli:  We discussed the possible etiologies of recurrent UTls including ascending infection related to intercourse; transmural infection that has been treated incompletely; urinary tract stones; incomplete bladder emptying with urinary stasis; kidney or bladder tumor; urethral diverticulum; and colonization of vagina and urinary tract with pathologic, adherent organisms.   For UTI prevention we discussed options including: Adequate fluid intake (>1.5 liters/day) to flush out the urinary tract. Go to the bathroom to urinate every 4-6 hours while awake to minimize urinary stasis / bacterial overgrowth in the bladder. Proanthocyanidin (PAC) supplement 36 mg daily; must be soluble (insoluble form of PAC will be ineffective). Recommend Ellura. D-mannose 2 g daily Vitamin C supplement Probiotic to maintain healthy bladder microbiome ***- UTI  prophylaxis with antibiotic after intercourse. ***- UTI prophylaxis with a daily low dose antibiotic. We discussed the potential risks of prolonged antibiotic treatment particularly with the risks of developing antibiotic resistance. ***For daily low dose Nitrofurantoin use we discussed risks including pulmonary and liver fibrosis.  For management of acute UTI symptoms: - Patient was advised that if/when UTI-like symptoms occur they can call our office to speak with a nurse, who can place an order for urinalysis (with reflex to urine culture). They may then proceed to a [facility] to provide their urine sample. This is required for evaluation in order for their Urology provider to make informed treatment recommendation(s), which may or may not include an antibiotic prescription. - Discussed option to take over-the-counter Pyridium (commonly known under the AZO brand name) for bladder pain. No more than 3 days at a time.  ***We discussed the role of diagnostic testing for further evaluation and investigation of potential infectious nidus such as: cystoscopy - RUS CT stone study Microgen PCR testing  Patient ultimately decided to start with *** and ***will consider OTC supplements for UTI prevention. Handout provided.  2. Kidney stones: No acute findings per recent CT imaging.  Will plan for follow up in *** months or sooner if needed. Pt verbalized understanding and agreement. All questions were answered.  PLAN Advised the following: 1. *** 2. ***No follow-ups on file.  No orders of the defined types were placed in this encounter.   It has been explained that the patient is to follow regularly with their PCP in addition to all other providers involved in their care and to  follow instructions provided by these respective offices. Patient advised to contact urology clinic if any urologic-pertaining questions, concerns, new symptoms or problems arise in the interim period.  There  are no Patient Instructions on file for this visit.  Electronically signed by:  Donnita Falls, MSN, FNP-C, CUNP 09/08/2022 5:32 AM

## 2022-09-09 ENCOUNTER — Other Ambulatory Visit: Payer: Self-pay | Admitting: Family Medicine

## 2022-09-09 DIAGNOSIS — G8929 Other chronic pain: Secondary | ICD-10-CM

## 2022-09-15 ENCOUNTER — Encounter (HOSPITAL_COMMUNITY): Payer: Self-pay | Admitting: Emergency Medicine

## 2022-09-15 ENCOUNTER — Emergency Department (HOSPITAL_COMMUNITY)
Admission: EM | Admit: 2022-09-15 | Discharge: 2022-09-16 | Disposition: A | Discharge status: Home / Self Care | Payer: Medicaid Other | Attending: Emergency Medicine | Admitting: Emergency Medicine

## 2022-09-15 ENCOUNTER — Other Ambulatory Visit: Payer: Self-pay

## 2022-09-15 DIAGNOSIS — I10 Essential (primary) hypertension: Secondary | ICD-10-CM | POA: Diagnosis not present

## 2022-09-15 DIAGNOSIS — Z79899 Other long term (current) drug therapy: Secondary | ICD-10-CM | POA: Diagnosis not present

## 2022-09-15 DIAGNOSIS — R519 Headache, unspecified: Secondary | ICD-10-CM | POA: Diagnosis present

## 2022-09-15 DIAGNOSIS — M7122 Synovial cyst of popliteal space [Baker], left knee: Secondary | ICD-10-CM

## 2022-09-15 DIAGNOSIS — R600 Localized edema: Secondary | ICD-10-CM | POA: Diagnosis not present

## 2022-09-15 NOTE — ED Triage Notes (Signed)
Pt with c/o HTN x 3 days, peripheral edema to bilateral feet and legs that has "gotten worse", and chills x 2 days. Also c/o nausea whenever she tries to eat.

## 2022-09-16 ENCOUNTER — Emergency Department (HOSPITAL_COMMUNITY): Payer: Medicaid Other

## 2022-09-16 LAB — URINALYSIS, ROUTINE W REFLEX MICROSCOPIC
Bilirubin Urine: NEGATIVE
Glucose, UA: NEGATIVE mg/dL
Ketones, ur: NEGATIVE mg/dL
Leukocytes,Ua: NEGATIVE
Nitrite: NEGATIVE
Protein, ur: NEGATIVE mg/dL
Specific Gravity, Urine: 1.024 (ref 1.005–1.030)
pH: 7 (ref 5.0–8.0)

## 2022-09-16 LAB — COMPREHENSIVE METABOLIC PANEL
ALT: 71 U/L — ABNORMAL HIGH (ref 0–44)
AST: 47 U/L — ABNORMAL HIGH (ref 15–41)
Albumin: 3.2 g/dL — ABNORMAL LOW (ref 3.5–5.0)
Alkaline Phosphatase: 130 U/L — ABNORMAL HIGH (ref 38–126)
Anion gap: 5 (ref 5–15)
BUN: 14 mg/dL (ref 6–20)
CO2: 24 mmol/L (ref 22–32)
Calcium: 8.5 mg/dL — ABNORMAL LOW (ref 8.9–10.3)
Chloride: 107 mmol/L (ref 98–111)
Creatinine, Ser: 1.05 mg/dL — ABNORMAL HIGH (ref 0.44–1.00)
GFR, Estimated: >60 mL/min (ref >60)
Glucose, Bld: 108 mg/dL — ABNORMAL HIGH (ref 70–99)
Potassium: 4 mmol/L (ref 3.5–5.1)
Sodium: 136 mmol/L (ref 135–145)
Total Bilirubin: 0.4 mg/dL (ref 0.3–1.2)
Total Protein: 6.9 g/dL (ref 6.5–8.1)

## 2022-09-16 LAB — CBC WITH DIFFERENTIAL/PLATELET
Abs Immature Granulocytes: 0.02 10*3/uL (ref 0.00–0.07)
Basophils Absolute: 0.1 10*3/uL (ref 0.0–0.1)
Basophils Relative: 1 %
Eosinophils Absolute: 0.4 10*3/uL (ref 0.0–0.5)
Eosinophils Relative: 5 %
HCT: 37.4 % (ref 36.0–46.0)
Hemoglobin: 12 g/dL (ref 12.0–15.0)
Immature Granulocytes: 0 %
Lymphocytes Relative: 26 %
Lymphs Abs: 2.1 10*3/uL (ref 0.7–4.0)
MCH: 28.4 pg (ref 26.0–34.0)
MCHC: 32.1 g/dL (ref 30.0–36.0)
MCV: 88.4 fL (ref 80.0–100.0)
Monocytes Absolute: 0.6 10*3/uL (ref 0.1–1.0)
Monocytes Relative: 8 %
Neutro Abs: 4.9 10*3/uL (ref 1.7–7.7)
Neutrophils Relative %: 60 %
Platelets: 259 10*3/uL (ref 150–400)
RBC: 4.23 MIL/uL (ref 3.87–5.11)
RDW: 14.1 % (ref 11.5–15.5)
WBC: 8.2 10*3/uL (ref 4.0–10.5)
nRBC: 0 % (ref 0.0–0.2)

## 2022-09-16 LAB — D-DIMER, QUANTITATIVE: D-Dimer, Quant: 0.89 ug/mL-FEU — ABNORMAL HIGH (ref 0.00–0.50)

## 2022-09-16 LAB — LIPASE, BLOOD: Lipase: 38 U/L (ref 11–51)

## 2022-09-16 LAB — BRAIN NATRIURETIC PEPTIDE: B Natriuretic Peptide: 107 pg/mL — ABNORMAL HIGH (ref 0.0–100.0)

## 2022-09-16 LAB — TROPONIN I (HIGH SENSITIVITY): Troponin I (High Sensitivity): 3 ng/L (ref <18)

## 2022-09-16 LAB — HCG, QUANTITATIVE, PREGNANCY: hCG, Beta Chain, Quant, S: 1 m[IU]/mL (ref <5)

## 2022-09-16 MED ORDER — SODIUM CHLORIDE 0.9 % IV BOLUS
1000.0000 mL | Freq: Once | INTRAVENOUS | Status: AC
  Administered 2022-09-16: 1000 mL via INTRAVENOUS

## 2022-09-16 MED ORDER — KETOROLAC TROMETHAMINE 15 MG/ML IJ SOLN
15.0000 mg | Freq: Once | INTRAMUSCULAR | Status: AC
  Administered 2022-09-16: 15 mg via INTRAVENOUS
  Filled 2022-09-16: qty 1

## 2022-09-16 MED ORDER — ONDANSETRON HCL 4 MG/2ML IJ SOLN
4.0000 mg | Freq: Once | INTRAMUSCULAR | Status: AC
  Administered 2022-09-16: 4 mg via INTRAVENOUS
  Filled 2022-09-16: qty 2

## 2022-09-16 MED ORDER — MECLIZINE HCL 12.5 MG PO TABS
25.0000 mg | ORAL_TABLET | Freq: Once | ORAL | Status: AC
  Administered 2022-09-16: 25 mg via ORAL
  Filled 2022-09-16: qty 2

## 2022-09-16 MED ORDER — FENTANYL CITRATE (PF) 100 MCG/2ML IJ SOLN
100.0000 ug | Freq: Once | INTRAMUSCULAR | Status: AC
  Administered 2022-09-16: 100 ug via INTRAVENOUS
  Filled 2022-09-16: qty 2

## 2022-09-16 MED ORDER — IOHEXOL 350 MG/ML SOLN
100.0000 mL | Freq: Once | INTRAVENOUS | Status: AC | PRN
  Administered 2022-09-16: 100 mL via INTRAVENOUS

## 2022-09-16 NOTE — ED Notes (Signed)
Pt completed fluids Pt able to stand and pivot from stretcher to wheelchair to the restroom

## 2022-09-16 NOTE — ED Provider Notes (Signed)
[place] [facility] AT [facility] Provider Note   CSN: [id number] Arrival date & time: 09/15/22  2303     History  Chief Complaint  Patient presents with   Hypertension   Peripheral Edema    Tracie Green is a 46 y.o. female.  The history is provided by the patient.  Patient with history of hypertension, distant history of PE, morbid obesity, chronic pain presents with multiple complaints.  Patient reports over the past several days she has had elevated blood pressure despite taking her medications.  She also reports generalized chills and nausea.  She has had a mild headache.  No fevers or vomiting.  She also reports increasing leg swelling and some mild chest discomfort.  She reports shortness of breath occasionally when standing up.  No active shortness of breath at this time.  She was seen at her pain management specialist this week and given her typical prescription, no other new medicines.  She is taking her medications in a timely fashion. No tick bites, no rash    Past Medical History:  Diagnosis Date   Hypertension    PE (pulmonary embolism)     Home Medications Prior to Admission medications   Medication Sig Start Date End Date Taking? Authorizing Provider  atorvastatin (LIPITOR) 40 MG tablet Take 1 tablet (40 mg total) by mouth daily. 01/27/22   Elmer Picker, NP  clotrimazole (LOTRIMIN) 1 % cream Apply 1 Application topically 2 (two) times daily. 02/20/22   Gardenia Phlegm, MD  dextromethorphan 15 MG/5ML syrup Take 10 mLs (30 mg total) by mouth 4 (four) times daily as needed for cough. 02/20/22   Gardenia Phlegm, MD  dicyclomine (BENTYL) 20 MG tablet Take 1 tablet (20 mg total) by mouth 3 (three) times daily as needed for up to 21 doses for spasms. 01/18/22   Terald Sleeper, MD  famotidine (PEPCID) 20 MG tablet Take 1 tablet (20 mg total) by mouth daily for 30 doses. 08/29/22 09/28/22  Carmel Sacramento A, PA-C  fluticasone (FLONASE) 50 MCG/ACT nasal  spray SPRAY 2 SPRAYS INTO EACH NOSTRIL EVERY DAY 05/23/22   Gilmore Laroche, FNP  levocetirizine (XYZAL) 5 MG tablet Take 1 tablet (5 mg total) by mouth every evening. 06/02/22   Gilmore Laroche, FNP  losartan (COZAAR) 100 MG tablet Take 100 mg by mouth daily. 06/26/22   [provider]  naproxen (NAPROSYN) 500 MG tablet Take 1 tablet (500 mg total) by mouth 2 (two) times daily with a meal. 03/17/22   Gilmore Laroche, FNP  omeprazole (PRILOSEC) 20 MG capsule Take 1 capsule (20 mg total) by mouth daily. 07/28/22   Gilmore Laroche, FNP  ondansetron (ZOFRAN-ODT) 4 MG disintegrating tablet Take 1 tablet (4 mg total) by mouth every 6 (six) hours as needed for nausea or vomiting. 08/29/22   Carmel Sacramento A, PA-C  oxyCODONE-acetaminophen (PERCOCET/ROXICET) 5-325 MG tablet Take 1 tablet by mouth every 6 (six) hours as needed for severe pain. 07/28/22   Gilmore Laroche, FNP  phentermine (ADIPEX-P) 37.5 MG tablet Take 37.5 mg by mouth daily. 05/29/22   [provider]  PRESCRIPTION MEDICATION darvocet    [provider]  PROBIOTIC PRODUCT PO Take 1 capsule by mouth daily.    [provider]  sertraline (ZOLOFT) 25 MG tablet TAKE 1 TABLET (25 MG TOTAL) BY MOUTH DAILY. 04/10/22   Gilmore Laroche, FNP  spironolactone (ALDACTONE) 50 MG tablet Take 50 mg by mouth daily. 06/26/22   [provider]  tiZANidine (ZANAFLEX) 4 MG tablet TAKE 1 TABLET BY MOUTH EVERY 6 HOURS AS NEEDED FOR MUSCLE SPASMS. 09/11/22   Gilmore Laroche, FNP  topiramate (TOPAMAX) 100 MG tablet Take 1 tablet (100 mg total) by mouth 2 (two) times daily. 01/26/22   Elmer Picker, NP  UNABLE TO FIND Med Name: Right knee brace ICD: W19.Lorne Skeens 06/09/22   Gilmore Laroche, FNP  UNABLE TO FIND Med Name:  metal hinges to support the knee W19.XXA M25.561 06/12/22   Gilmore Laroche, FNP      Allergies    Penicillins, Morphine, and Morphine and codeine    Review of Systems   Review of Systems  Constitutional:  Positive  for chills and fatigue. Negative for fever.  Cardiovascular:  Positive for leg swelling.  Gastrointestinal:  Positive for nausea.    Physical Exam Updated Vital Signs BP (!) 148/82   Pulse 81   Temp 98.7 F (37.1 C) (Oral)   Resp 13   Ht 1.778 m (5\' 10" )   Wt (!) 163.3 kg   LMP 08/27/2022 (Approximate)   SpO2 96%   BMI 51.65 kg/m  Physical Exam CONSTITUTIONAL: Well developed/well nourished, appears older than stated age HEAD: Normocephalic/atraumatic EYES: EOMI/PERRL ENMT: Mucous membranes moist NECK: supple no meningeal signs SPINE/BACK:entire spine nontender CV: S1/S2 noted, no murmurs/rubs/gallops noted LUNGS: Lungs are clear to auscultation bilaterally, no apparent distress ABDOMEN: soft, nontender, no rebound or guarding, bowel sounds noted throughout abdomen GU:no cva tenderness NEURO: Pt is awake/alert/appropriate, moves all extremitiesx4.  No facial droop.  No arm or leg drift.  There is no focal weakness noted EXTREMITIES: pulses normal/equal, full ROM Symmetric edema noted bilateral lower extremities.  No erythema or evidence of infection.  No deformities.  Distal pulses equal and intact.  Mild tenderness noted to both calves SKIN: warm, color normal PSYCH: no abnormalities of mood noted, alert and oriented to situation  ED Results / Procedures / Treatments   Labs (all labs ordered are listed, but only abnormal results are displayed) Labs Reviewed  COMPREHENSIVE METABOLIC PANEL - Abnormal; Notable for the following components:      Result Value   Glucose, Bld 108 (*)    Creatinine, Ser 1.05 (*)    Calcium 8.5 (*)    Albumin 3.2 (*)    AST 47 (*)    ALT 71 (*)    Alkaline Phosphatase 130 (*)    All other components within normal limits  URINALYSIS, ROUTINE W REFLEX MICROSCOPIC - Abnormal; Notable for the following components:   APPearance HAZY (*)    Hgb urine dipstick MODERATE (*)    Bacteria, UA RARE (*)    All other components within normal limits   BRAIN NATRIURETIC PEPTIDE - Abnormal; Notable for the following components:   B Natriuretic Peptide 107.0 (*)    All other components within normal limits  D-DIMER, QUANTITATIVE - Abnormal; Notable for the following components:   D-Dimer, Quant 0.89 (*)    All other components within normal limits  CBC WITH DIFFERENTIAL/PLATELET  LIPASE, BLOOD  HCG, QUANTITATIVE, PREGNANCY  TROPONIN I (HIGH SENSITIVITY)    EKG EKG Interpretation  Date/Time:  Saturday September 16 2022 00:24:39 EDT Ventricular Rate:  97 PR Interval:  42 QRS Duration: 96 QT Interval:  384 QTC Calculation: 488 R Axis:   40 Text Interpretation: Sinus rhythm Short PR interval Abnormal R-wave progression, early transition Abnormal inferior Q waves Interpretation limited secondary to artifact Confirmed by Zadie Rhine ([id number]) on 09/16/2022 12:43:47 AM  Radiology CT Angio  Chest PE W and/or Wo Contrast  Result Date: 09/16/2022 CLINICAL DATA:  Positive D-dimer and leg swelling, initial encounter EXAM: CT ANGIOGRAPHY CHEST WITH CONTRAST TECHNIQUE: Multidetector CT imaging of the chest was performed using the standard protocol during bolus administration of intravenous contrast. Multiplanar CT image reconstructions and MIPs were obtained to evaluate the vascular anatomy. RADIATION DOSE REDUCTION: This exam was performed according to the departmental dose-optimization program which includes automated exposure control, adjustment of the mA and/or kV according to patient size and/or use of iterative reconstruction technique. CONTRAST:  OMNIPAQUE IOHEXOL 350 MG/ML SOLN COMPARISON:  Plain film from earlier in the same day. FINDINGS: Cardiovascular: Thoracic aorta is within normal limits. No cardiac enlargement is seen. Pulmonary artery shows a normal branching pattern without filling defect to suggest pulmonary embolism. No coronary calcifications are identified. Mediastinum/Nodes: Thoracic inlet is within normal limits. No hilar or  mediastinal adenopathy is noted. The esophagus as visualized is within normal limits. Lungs/Pleura: Lungs are well aerated bilaterally. Minimal right basilar atelectasis is noted posteriorly. Upper Abdomen: Visualized upper abdomen is unremarkable. Musculoskeletal: No chest wall abnormality. No acute or significant osseous findings. Review of the MIP images confirms the above findings. IMPRESSION: No evidence of pulmonary emboli. Mild right basilar atelectasis. Electronically Signed   By: Alcide Clever M.D.   On: 09/16/2022 03:19   DG Chest Portable 1 View  Result Date: 09/16/2022 CLINICAL DATA:  Chest pain and shortness of breath. EXAM: PORTABLE CHEST 1 VIEW COMPARISON:  Chest radiograph dated 08/01/2021. FINDINGS: No focal consolidation, pleural effusion, or pneumothorax. Stable cardiac silhouette. No acute osseous pathology. IMPRESSION: No active disease. Electronically Signed   By: Elgie Collard M.D.   On: 09/16/2022 00:41    Procedures Procedures    Medications Ordered in ED Medications  ondansetron (ZOFRAN) injection 4 mg (4 mg Intravenous Given 09/16/22 0049)  fentaNYL (SUBLIMAZE) injection 100 mcg (100 mcg Intravenous Given 09/16/22 0048)  iohexol (OMNIPAQUE) 350 MG/ML injection 100 mL (100 mLs Intravenous Contrast Given 09/16/22 0301)  ketorolac (TORADOL) 15 MG/ML injection 15 mg (15 mg Intravenous Given 09/16/22 0455)  meclizine (ANTIVERT) tablet 25 mg (25 mg Oral Given 09/16/22 0558)  sodium chloride 0.9 % bolus 1,000 mL (1,000 mLs Intravenous New Bag/Given 09/16/22 0559)    ED Course/ Medical Decision Making/ A&P Clinical Course as of 09/16/22 0657  Sat Sep 16, 2022  0055 Patient presents with multiple complaints including chills, leg swelling, fatigue.  Extensive workup has been initiated [DW]  0157 Patient reporting persistent pain in her left chest that is worse with breathing.  Will add on D-dimer [DW]  0541 Patient monitored throughout the night without any deterioration.  She  had a complicated history, presenting with elevated blood pressure and leg swelling that she was concerned for DVT.  She later mentioned having pleuritic type chest pain.  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  Extensive evaluation reveals no acute PE, and her labs have ruled out CHF.  Patient now reporting pain throughout her legs and generalized vertiginous symptoms with movement.  This was not present when she arrived.  While in the bed she has no symptoms.  Her vertigo could be related to the pain medicine she has been given.  She is still concerned about a DVT.  Plan will be to monitor in the ED and have DVT studies later this morning.  Will also treat her dizziness symptoms. [DW]  [phone] Signed out to dr Con Memos at shift change with [place] pending.  If negative, she may need discharged with lasix for peripheral edema [DW]    Clinical Course User Index [DW] Zadie Rhine, MD                             Medical Decision Making Amount and/or Complexity of Data Reviewed Labs: ordered. Radiology: ordered. ECG/medicine tests: ordered.  Risk Prescription drug management.   This patient presents to the ED for concern of generalized weakness and leg swelling, this involves an extensive number of treatment options, and is a complaint that carries with it a high risk of complications and morbidity.  The differential diagnosis includes but is not limited to CVA, intracranial hemorrhage, acute coronary syndrome, renal failure, urinary tract infection, electrolyte disturbance, pneumonia For leg swelling-DVT, cellulitis, anasarca  Comorbidities that complicate the patient evaluation: Patient's presentation is complicated by their history of hypertension and obesity  Social Determinants of Health: Patient's  chronic pain   increases the complexity of managing their presentation  Additional history obtained: Records reviewed previous admission documents previous admitted for strokelike  syndrome and received TNK but negative MRI  Lab Tests: I Ordered, and personally interpreted labs.  The pertinent results include: Mild transaminitis  Imaging Studies ordered: I ordered imaging studies including X-ray chest   I independently visualized and interpreted imaging which showed no acute findings I agree with the radiologist interpretation  Cardiac Monitoring: The patient was maintained on a cardiac monitor.  I personally viewed and interpreted the cardiac monitor which showed an underlying rhythm of:  sinus rhythm  Medicines ordered and prescription drug management: I ordered medication including fentanyl for pain Reevaluation of the patient after these medicines showed that the patient    improved   Reevaluation: After the interventions noted above, I reevaluated the patient and found that they have :stayed the same  Complexity of problems addressed: Patient's presentation is most consistent with  acute presentation with potential threat to life or bodily function          Final Clinical Impression(s) / ED Diagnoses Final diagnoses:  Primary hypertension  Peripheral edema    Rx / DC Orders ED Discharge Orders     None         Zadie Rhine, MD 09/16/22 [phone]

## 2022-09-16 NOTE — ED Notes (Signed)
Recd report on pt All labs and urine resulted Pt waiting on [place] to r/o DVT

## 2022-09-16 NOTE — ED Provider Notes (Signed)
Pt signed out by Dr. Bebe Shaggy pending [place].  [place]:  No  lower extremity DVT.    Large left Baker cyst.    Pt is stable for d/c.  She is encouraged to avoid processed food and to increase fruits/veg in diet.  Eat a low salt diet.  Return if worse.  Follow up with pcp.    Jacalyn Lefevre, MD 09/16/22 [phone]

## 2022-09-16 NOTE — ED Notes (Signed)
Pt unable to remain awake during assessment by this RN Pt complains of HTN, B/L leg pain and intermittent CP Pt is attached to full monitor Vitals assessed and listed IV site positional, NS recd approx 50-100cc at this time. Attached bolus to pump. Waiting on [place]

## 2022-09-19 ENCOUNTER — Ambulatory Visit: Payer: Medicaid Other | Admitting: Family Medicine

## 2022-09-26 ENCOUNTER — Telehealth (INDEPENDENT_AMBULATORY_CARE_PROVIDER_SITE_OTHER): Payer: Medicaid Other | Admitting: Family Medicine

## 2022-09-26 ENCOUNTER — Encounter: Payer: Self-pay | Admitting: Family Medicine

## 2022-09-26 DIAGNOSIS — I10 Essential (primary) hypertension: Secondary | ICD-10-CM

## 2022-09-26 NOTE — Progress Notes (Unsigned)
Virtual Visit via Video Note  I connected with Tracie Green on 09/27/22 at  1:20 PM EDT by a video enabled telemedicine application and verified that I am speaking with the correct person using two identifiers.  Patient Location: in her car Provider Location: Office/Clinic  I discussed the limitations, risks, security, and privacy concerns of performing an evaluation and management service by video and the availability of in person appointments. I also discussed with the patient that there may be a patient responsible charge related to this service. The patient expressed understanding and agreed to proceed.  Subjective: PCP: Gilmore Laroche, FNP  Chief Complaint  Patient presents with   Hypertension    Pt reports bp has been running really high.   Depression    Pt reports depression following her brothers passing.    Leg Swelling    Pt reports swelling on her legs. It hurts to walk.    HPI  The patient reports that her Bp has not been well controlled, noting that her systolic has been in the 160s and diastolic in the 120s. She also notes increased swelling and pain with ambulation in  right legs with no evidence of DVT on ultrasound. Chest x-ray shows no cardiovascular abnormalities. She is following up with pain management and reports minimum relief with her current treatment regimen.  She notes the passing of her brother and has been feeling down.NO SI/HI reported.      ROS: Per HPI  Current Outpatient Medications:    atorvastatin (LIPITOR) 40 MG tablet, Take 1 tablet (40 mg total) by mouth daily., Disp: 30 tablet, Rfl: 1   clotrimazole (LOTRIMIN) 1 % cream, Apply 1 Application topically 2 (two) times daily., Disp: 30 g, Rfl: 0   dextromethorphan 15 MG/5ML syrup, Take 10 mLs (30 mg total) by mouth 4 (four) times daily as needed for cough., Disp: 120 mL, Rfl: 0   dicyclomine (BENTYL) 20 MG tablet, Take 1 tablet (20 mg total) by mouth 3 (three) times daily as needed for up to 21  doses for spasms., Disp: 21 tablet, Rfl: 0   famotidine (PEPCID) 20 MG tablet, Take 1 tablet (20 mg total) by mouth daily for 30 doses., Disp: 30 tablet, Rfl: 0   fluticasone (FLONASE) 50 MCG/ACT nasal spray, SPRAY 2 SPRAYS INTO EACH NOSTRIL EVERY DAY, Disp: 48 mL, Rfl: 1   levocetirizine (XYZAL) 5 MG tablet, Take 1 tablet (5 mg total) by mouth every evening., Disp: 30 tablet, Rfl: 1   losartan (COZAAR) 100 MG tablet, Take 100 mg by mouth daily., Disp: , Rfl:    naproxen (NAPROSYN) 500 MG tablet, Take 1 tablet (500 mg total) by mouth 2 (two) times daily with a meal., Disp: 20 tablet, Rfl: 0   omeprazole (PRILOSEC) 20 MG capsule, Take 1 capsule (20 mg total) by mouth daily., Disp: 30 capsule, Rfl: 3   ondansetron (ZOFRAN-ODT) 4 MG disintegrating tablet, Take 1 tablet (4 mg total) by mouth every 6 (six) hours as needed for nausea or vomiting., Disp: 15 tablet, Rfl: 0   oxyCODONE-acetaminophen (PERCOCET/ROXICET) 5-325 MG tablet, Take 1 tablet by mouth every 6 (six) hours as needed for severe pain., Disp: 8 tablet, Rfl: 0   phentermine (ADIPEX-P) 37.5 MG tablet, Take 37.5 mg by mouth daily., Disp: , Rfl:    PRESCRIPTION MEDICATION, darvocet, Disp: , Rfl:    PROBIOTIC PRODUCT PO, Take 1 capsule by mouth daily., Disp: , Rfl:    sertraline (ZOLOFT) 25 MG tablet, TAKE 1 TABLET (25  MG TOTAL) BY MOUTH DAILY., Disp: 90 tablet, Rfl: 2   spironolactone (ALDACTONE) 50 MG tablet, Take 50 mg by mouth daily., Disp: , Rfl:    tiZANidine (ZANAFLEX) 4 MG tablet, TAKE 1 TABLET BY MOUTH EVERY 6 HOURS AS NEEDED FOR MUSCLE SPASMS., Disp: 30 tablet, Rfl: 0   topiramate (TOPAMAX) 100 MG tablet, Take 1 tablet (100 mg total) by mouth 2 (two) times daily., Disp: 30 tablet, Rfl: 1   UNABLE TO FIND, Med Name: Right knee brace ICD: W19.Lorne Skeens, Disp: 1 each, Rfl: 0   UNABLE TO FIND, Med Name:  metal hinges to support the knee W19.XXA M25.561, Disp: 1 each, Rfl: 0  Observations/Objective: There were no vitals filed for this  visit. Physical Exam Patient is well-developed, well-nourished in no acute distress.  Resting comfortably at home.  Head is normocephalic, atraumatic.  No labored breathing.  Speech is clear and coherent with logical content.  Patient is alert and oriented at baseline.   Assessment and Plan: Encouraged to go to the ED for BP> 180/120 with symptoms of headaches, chest pain and palpations. Encouraged to schedule an office visit to assess the patient's leg swelling and BP to make adjustments to medications Encouraged to continue taking Zoloft 25 mg daily and will make adjustment at her next visit Follow Up Instructions: in 1 week I discussed the assessment and treatment plan with the patient. The patient was provided an opportunity to ask questions, and all were answered. The patient agreed with the plan and demonstrated an understanding of the instructions.   The patient was advised to call back or seek an in-person evaluation if the symptoms worsen or if the condition fails to improve as anticipated.  The above assessment and management plan was discussed with the patient. The patient verbalized understanding of and has agreed to the management plan.   Gilmore Laroche, FNP

## 2022-09-28 ENCOUNTER — Telehealth: Payer: Medicaid Other | Admitting: Family Medicine

## 2022-09-29 NOTE — Progress Notes (Deleted)
Tracie Green Oct 20, 1975 [id number]  History of Present Illness: Tracie Green is a 47 y.o. female who presents today as a new patient at [facility]. All available relevant medical records have been reviewed.  - Past medical history includes HTN, HLD, prior PE, chronic pain syndrome, chronic midline back pain, obesity. - GU/GYN History: 1. Kidney stones. - Prior ureteroscopic stone manipulation with stent placement in 2021 by Dr. Malen Gauze at [facility]. - Per Dr. Mercy Riding note on 04/09/2020: "She poorly tolerates stone passage with difficulty in controlling pain and poorly tolerates stent."  - 08/29/2022: CT abdomen/pelvis w/ contrast showed "Small nonobstructing stones are noted on the left measuring up to 3 mm in the lower pole." 2. Needs I&O cath for clean urine specimen:  - Per Dr. Mercy Riding note on 04/09/2020: "her voided specimen is completely contaminated. Nursing has noted with the catheterized urine attempt that she is not clean in the area and it does not appear that she can reach to wipe stool bowel movement."   Today She reports chief complaint of recurrent UTls.   Urine culture results in past 12 months: - 01/18/2022: Positive for >100k E. Coli ESBL; multi-drug resistant organism - unresponsive to most typical oral antibiotics except Nitrofurantoin per culture sensitivities. Was prescribed Bactrim DS.  - 01/23/2022: Positive for >100k E. Coli ESBL; multi-drug resistant organism - unresponsive to most typical oral antibiotics except Nitrofurantoin per culture sensitivities. Was prescribed Macrobid. - 06/09/2022: Positive for >100k E. Coli; multi-drug resistant organism - unresponsive to typical oral antibiotics per culture sensitivities. Was prescribed Macrobid empirically at first then switched to Fosfomycin 3 gram pack based on culture sensitivities.    Of note, she has been treated with antibiotics at least 3 other times for "UTI" despite lack of supporting lab data.  -  02/20/2022: No UA or culture result found per chart review; was prescribed Macrobid for suspected UTI via telemedicine. - 05/23/2022: No UA or culture result found per chart review; PCP prescribed Bactrim for possible UTI per patient phone call. - 07/28/2022: No culture result found per chart review; PCP prescribed Fosfomycin 3 gram for UTI based on finding of trace leukocytes in UA per visit note.  Urinary Symptoms: She reports *** UTl's in the last year. When present, UTI symptoms include ***dysuria, ***increased urinary urgency, ***frequency, ***. She reports that UTI symptoms do ***not seem to correlate with intercourse. She {Actions; denies-reports:120008} acute UTI symptoms today.  At baseline: She {Actions; denies-reports:120008} urinary urgency, frequency, dysuria, gross hematuria, hesitancy, straining to void, or sensations of incomplete emptying. She reports voiding *** times per day and *** times at night. She {Actions; denies-reports:120008} pushing on a bulge in order to empty her bladder.  She {Actions; denies-reports:120008} urge incontinence. She {Actions; denies-reports:120008} stress incontinence with ***cough/***laugh/***sneeze/***heavy lifting/***exercise. She {Actions; denies-reports:120008} enuresis. She leaks *** times per ***. Wears *** ***pads / ***diapers per day. She states the ***SUI / ***UUI is predominant. This has been going on for {NUMBERS 1-12:18279} {days/wks/mos/yrs:310907} and {ACTION; IS/IS WGN:[id number]} significantly bothersome. In terms of treatment, She has tried ***.  She {Actions; denies-reports:120008} caffeine intake.  She {Actions; denies-reports:120008} history of pyelonephritis.  She {Actions; denies-reports:120008} history of kidney stones.  Vaginal / prolapse Symptoms: She {Actions; denies-reports:120008} vaginal bulge sensation.  She {Actions; denies-reports:120008} seeing a vaginal bulge. This bulge is bothersome and is the size of ***. It  was first noticed ***.  She {Actions; denies-reports:120008} vaginal pain, bleeding, or discharge.  She {Actions; denies-reports:120008} use of topical vaginal estrogen cream.  Bowel Symptoms: She has a continent bowel movement *** time(s) per ***. Typical Bristol stool scale of continent bowel episodes is Type ***. She {Actions; denies-reports:120008} Fl episodes. Fecal incontinence started *** and occurs *** times per week. Typical Bristol stool scale of incontinent bowel episodes is Type ***. She {Actions; denies-reports:120008} straining and {Actions; denies-reports:120008} splinting to defecate. She {Actions; denies-reports:120008} rectal bleeding. She {Actions; denies-reports:120008} feeling like she fully empties her rectum with defecation. She {Actions; denies-reports:120008} urgency with defecation. She {Actions; denies-reports:120008} difficulty wiping clean. She {Actions; denies-reports:120008} taking laxatives, stool softeners, or fiber supplements. Last colonoscopy was***.  Past OB/GYN History: OB History   No obstetric history on file.    She {Actions; denies-reports:120008} being sexually active.  She {Actions; denies-reports:120008} dyspareunia. ***Dyspareunia is located ***at the introitus / ***deep inside the vagina. She has *** child(ren) who were delivered ***vaginally ***via c-section. She {Actions; denies-reports:120008} significant tearing with that ***delivery / those ***deliveries. She is {DESC; PRE/POST:32304} menopausal.  Last pap smear was ***. She {Actions; denies-reports:120008} history of *** hysterectomy.  Fall Screening: Do you usually have a device to assist in your mobility? {yes/no:20286} ***cane / ***walker / ***wheelchair   Medications: Current Outpatient Medications  Medication Sig Dispense Refill   atorvastatin (LIPITOR) 40 MG tablet Take 1 tablet (40 mg total) by mouth daily. 30 tablet 1   clotrimazole (LOTRIMIN) 1 % cream Apply 1 Application  topically 2 (two) times daily. 30 g 0   dextromethorphan 15 MG/5ML syrup Take 10 mLs (30 mg total) by mouth 4 (four) times daily as needed for cough. 120 mL 0   dicyclomine (BENTYL) 20 MG tablet Take 1 tablet (20 mg total) by mouth 3 (three) times daily as needed for up to 21 doses for spasms. 21 tablet 0   famotidine (PEPCID) 20 MG tablet Take 1 tablet (20 mg total) by mouth daily for 30 doses. 30 tablet 0   fluticasone (FLONASE) 50 MCG/ACT nasal spray SPRAY 2 SPRAYS INTO EACH NOSTRIL EVERY DAY 48 mL 1   levocetirizine (XYZAL) 5 MG tablet Take 1 tablet (5 mg total) by mouth every evening. 30 tablet 1   losartan (COZAAR) 100 MG tablet Take 100 mg by mouth daily.     naproxen (NAPROSYN) 500 MG tablet Take 1 tablet (500 mg total) by mouth 2 (two) times daily with a meal. 20 tablet 0   omeprazole (PRILOSEC) 20 MG capsule Take 1 capsule (20 mg total) by mouth daily. 30 capsule 3   ondansetron (ZOFRAN-ODT) 4 MG disintegrating tablet Take 1 tablet (4 mg total) by mouth every 6 (six) hours as needed for nausea or vomiting. 15 tablet 0   oxyCODONE-acetaminophen (PERCOCET/ROXICET) 5-325 MG tablet Take 1 tablet by mouth every 6 (six) hours as needed for severe pain. 8 tablet 0   phentermine (ADIPEX-P) 37.5 MG tablet Take 37.5 mg by mouth daily.     PRESCRIPTION MEDICATION darvocet     PROBIOTIC PRODUCT PO Take 1 capsule by mouth daily.     sertraline (ZOLOFT) 25 MG tablet TAKE 1 TABLET (25 MG TOTAL) BY MOUTH DAILY. 90 tablet 2   spironolactone (ALDACTONE) 50 MG tablet Take 50 mg by mouth daily.     tiZANidine (ZANAFLEX) 4 MG tablet TAKE 1 TABLET BY MOUTH EVERY 6 HOURS AS NEEDED FOR MUSCLE SPASMS. 30 tablet 0   topiramate (TOPAMAX) 100 MG tablet Take 1 tablet (100 mg total) by mouth 2 (two) times daily. 30 tablet 1   UNABLE TO FIND Med Name: Right knee brace  ICD: W19.XXXA 1 each 0   UNABLE TO FIND Med Name:  metal hinges to support the knee W19.XXA M25.561 1 each 0   No current facility-administered  medications for this visit.    Allergies: Allergies  Allergen Reactions   Penicillins Hives and Rash    Hives   Morphine Nausea And Vomiting   Morphine And Codeine Nausea And Vomiting    Past Medical History:  Diagnosis Date   Hypertension    PE (pulmonary embolism)    Past Surgical History:  Procedure Laterality Date   CESAREAN SECTION     CHOLECYSTECTOMY  1998   INCISION AND DRAINAGE     No family history on file. Social History   Socioeconomic History   Marital status: Legally Separated    Spouse name: Not on file   Number of children: Not on file   Years of education: Not on file   Highest education level: Not on file  Occupational History   Not on file  Tobacco Use   Smoking status: Never   Smokeless tobacco: Never  Substance and Sexual Activity   Alcohol use: No   Drug use: No   Sexual activity: Not on file  Other Topics Concern   Not on file  Social History Narrative   Not on file   Social Determinants of Health   Financial Resource Strain: Not on file  Food Insecurity: No Food Insecurity (01/26/2022)   Hunger Vital Sign    Worried About Running Out of Food in the Last Year: Never true    Ran Out of Food in the Last Year: Never true  Transportation Needs: No Transportation Needs (01/26/2022)   PRAPARE - Administrator, Civil Service (Medical): No    Lack of Transportation (Non-Medical): No  Physical Activity: Not on file  Stress: Not on file  Social Connections: Not on file  Intimate Partner Violence: Not At Risk (01/26/2022)   Humiliation, Afraid, Rape, and Kick questionnaire    Fear of Current or Ex-Partner: No    Emotionally Abused: No    Physically Abused: No    Sexually Abused: No    SUBJECTIVE  Review of Systems Constitutional: Patient ***denies any unintentional weight loss or change in strength lntegumentary: Patient ***denies any rashes or pruritus Eyes: Patient denies ***dry eyes ENT: Patient ***denies dry  mouth Cardiovascular: Patient ***denies chest pain or syncope Respiratory: Patient ***denies shortness of breath Gastrointestinal: Patient ***denies nausea, vomiting, constipation, or diarrhea Musculoskeletal: Patient ***denies muscle cramps or weakness Neurologic: Patient ***denies convulsions or seizures Psychiatric: Patient ***denies memory problems Allergic/Immunologic: Patient ***denies recent allergic reaction(s) Hematologic/Lymphatic: Patient denies bleeding tendencies Endocrine: Patient ***denies heat/cold intolerance  GU: As per HPI.  OBJECTIVE There were no vitals filed for this visit. There is no height or weight on file to calculate BMI.  Constitutional: ***No obvious distress / non-toxic appearing Cardiovascular: ***Pulse is regular Respiratory: ***The patient does not have audible wheezing/strider; respirations do not appear labored  Gastrointestinal: ***Abdomen non-distended Musculoskeletal: ***Normal ROM of UEs  Skin: ***No obvious rashes/open sores  Neurologic: ***CN 2-12 grossly intact Psychiatric: ***Answered questions appropriately w/normal affect  Hematologic/Lymphatic/Immunologic: ***No obvious bruises or sites of spontaneous bleeding   UA (I&O cath): {Desc; negative/positive:13464} *** WBC/hpf, *** RBC/hpf, bacteria (***) *** nitrites, *** leukocytes, *** blood PVR: *** ml  ASSESSMENT Recurrent UTI  Kidney stones  1. Recurrent UTls:  We discussed the possible etiologies of recurrent UTls including ascending infection related to intercourse; vaginal atrophy; transmural infection that has  been treated incompletely; urinary tract stones; incomplete bladder emptying with urinary stasis; kidney or bladder tumor; urethral diverticulum; and colonization of  vagina and urinary tract with pathologic, adherent organisms.   For UTI prevention we discussed options including: Adequate fluid intake (>1.5 liters/day) to flush out the urinary tract. - Go to the  bathroom to urinate every 4-6 hours while awake to minimize urinary stasis / bacterial overgrowth in the bladder. - Proanthocyanidin (PAC) supplement 36 mg daily; must be soluble (insoluble form of PAC will be ineffective). Recommend Ellura. D-mannose 2 g daily Vitamin C supplement Probiotic to maintain healthy vaginal microbiome ***- UTI prophylaxis with antibiotic after intercourse.  If recurrent UTls persist, may consider UTI prophylaxis with a daily low dose antibiotic in the future.  For management of acute UTI symptoms: - Patient was advised that if/when UTI-like symptoms occur they can call our office to speak with a nurse, who can place an order for urinalysis (with reflex to urine culture). They may then proceed to a [facility] to provide their urine sample. This is required for evaluation in order for their Urology provider to make informed treatment recommendation(s), which may or may not include an antibiotic prescription. - Discussed option to take over-the-counter Pyridium (commonly known under the AZO brand name) for bladder pain. No more than 3 days at a time.  Patient ***will consider OTC supplements for UTI prevention. ***Handout provided.  2. Kidney stones.  ***We discussed CT abdomen/pelvis w/ contrast findings from 08/29/2022 with evidence of small non-obstructing left lower pole kidney stones.  - ***Advised KUB in 6 months for stone surveillance.  - ***Advised adequate hydration and we discussed option to consider low oxalate diet given that calcium oxalate is the most common type of stone. ***Handout provided about stone prevention diet.  Will plan for follow up in ***6 months or sooner if needed. Pt verbalized understanding and agreement. All questions were answered.  PLAN Advised the following: 1. Adequate fluid intake (>1.5 liters/day) to flush out the urinary tract. 2. Urinate every 4-6 hours while awake to minimize urinary stasis/ bacterial overgrowth  in the bladder. 3. Consider OTC supplements for UTI prevention. 4. ***No follow-ups on file.  No orders of the defined types were placed in this encounter.   It has been explained that the patient is to follow regularly with their PCP in addition to all other providers involved in their care and to follow instructions provided by these respective offices. Patient advised to contact urology clinic if any urologic-pertaining questions, concerns, new symptoms or problems arise in the interim period.  There are no Patient Instructions on file for this visit.  Electronically signed by:  Donnita Falls, MSN, FNP-C, CUNP 09/29/2022 2:50 PM

## 2022-10-02 ENCOUNTER — Ambulatory Visit: Payer: Medicaid Other | Admitting: Urology

## 2022-10-02 ENCOUNTER — Other Ambulatory Visit: Payer: Self-pay | Admitting: Family Medicine

## 2022-10-02 DIAGNOSIS — G8929 Other chronic pain: Secondary | ICD-10-CM

## 2022-10-02 DIAGNOSIS — N200 Calculus of kidney: Secondary | ICD-10-CM

## 2022-10-02 DIAGNOSIS — N390 Urinary tract infection, site not specified: Secondary | ICD-10-CM

## 2022-10-04 ENCOUNTER — Ambulatory Visit: Payer: Medicaid Other | Admitting: Family Medicine

## 2022-10-16 ENCOUNTER — Ambulatory Visit (HOSPITAL_COMMUNITY): Payer: Medicaid Other | Attending: Family Medicine

## 2022-10-17 ENCOUNTER — Other Ambulatory Visit: Payer: Self-pay | Admitting: Nurse Practitioner

## 2022-10-17 DIAGNOSIS — Z6841 Body Mass Index (BMI) 40.0 and over, adult: Secondary | ICD-10-CM

## 2022-10-23 ENCOUNTER — Other Ambulatory Visit: Payer: Self-pay | Admitting: Family Medicine

## 2022-10-23 DIAGNOSIS — K219 Gastro-esophageal reflux disease without esophagitis: Secondary | ICD-10-CM

## 2022-10-24 NOTE — Progress Notes (Deleted)
Name: Tracie Green DOB: [birth year] MRN: [id number]  History of Present Illness: Tracie Green is a 47 y.o. female who presents today as a new patient at [facility]. All available relevant medical records have been reviewed.  - Past medical history includes HTN, HLD, prior PE, chronic pain syndrome, chronic midline back pain, obesity. - GU/GYN History: 1. Kidney stones. - Prior ureteroscopic stone manipulation with stent placement in 2021 by Dr. Malen Gauze at [facility]. - Per Dr. Mercy Riding note on 04/09/2020: "She poorly tolerates stone passage with difficulty in controlling pain and poorly tolerates stent."  - 08/29/2022: CT abdomen/pelvis w/ contrast showed "Small nonobstructing stones are noted on the left measuring up to 3 mm in the lower pole." 2. Needs I&O cath for clean urine specimen:  - Per Dr. Mercy Riding note on 04/09/2020: "her voided specimen is completely contaminated. Nursing has noted with the catheterized urine attempt that she is not clean in the area and it does not appear that she can reach to wipe stool bowel movement."   She reports chief complaint of recurrent UTls.  Urine culture results in past 12 months: - 01/18/2022: Positive for >100k E. Coli ESBL; multi-drug resistant organism - unresponsive to most typical oral antibiotics except Nitrofurantoin per culture sensitivities. Was prescribed Bactrim DS.  - 01/23/2022: Positive for >100k E. Coli ESBL; multi-drug resistant organism - unresponsive to most typical oral antibiotics except Nitrofurantoin per culture sensitivities. Was prescribed Macrobid. - 06/09/2022: Positive for >100k E. Coli; multi-drug resistant organism - unresponsive to typical oral antibiotics per culture sensitivities. Was prescribed Macrobid empirically at first then switched to Fosfomycin 3 gram pack based on culture sensitivities.    Of note, she has been treated with antibiotics at least 3 other times for "UTI" despite lack of supporting lab data.   - 02/20/2022: No UA or culture result found per chart review; was prescribed Macrobid for suspected UTI via telemedicine. - 05/23/2022: No UA or culture result found per chart review; PCP prescribed Bactrim for possible UTI per patient phone call. - 07/28/2022: No culture result found per chart review; PCP prescribed Fosfomycin 3 gram for UTI based on finding of trace leukocytes in UA per visit note.  Relevant imaging: 08/29/2022: CT abdomen/pelvis w/ contrast showed small nonobstructing left lower pole kidney stones measuring up to 3 mm. No GU masses or hydronephrosis.   Today: She reports *** UTls in the past 12 months. When present, UTI symptoms include ***dysuria, ***increased urinary urgency, ***frequency, ***.  She reports that UTI symptoms do ***not seem to correlate with intercourse.  She {DENIES/REPORTS DEFAULT NFAOZH:08657} acute UTI symptoms today.  She {Actions; denies-reports:120008} increased urinary urgency, frequency, nocturia, dysuria, gross hematuria, hesitancy, straining to void, or sensations of incomplete emptying.  Per review of prior notes, she is followed by Pain Management and has been referred to physical therapy for chronic back pain. She has also been referred to GI for upper abdominal pain, nausea, vomiting, diarrhea, and hematochezia.    Fall Screening: Do you usually have a device to assist in your mobility? {yes/no:20286} ***cane / ***walker / ***wheelchair  Medications: Current Outpatient Medications  Medication Sig Dispense Refill   atorvastatin (LIPITOR) 40 MG tablet Take 1 tablet (40 mg total) by mouth daily. 30 tablet 1   clotrimazole (LOTRIMIN) 1 % cream Apply 1 Application topically 2 (two) times daily. 30 g 0   dextromethorphan 15 MG/5ML syrup Take 10 mLs (30 mg total) by mouth 4 (four) times daily as needed for cough. 120  mL 0   dicyclomine (BENTYL) 20 MG tablet Take 1 tablet (20 mg total) by mouth 3 (three) times daily as needed for up to 21 doses  for spasms. 21 tablet 0   famotidine (PEPCID) 20 MG tablet Take 1 tablet (20 mg total) by mouth daily for 30 doses. 30 tablet 0   fluticasone (FLONASE) 50 MCG/ACT nasal spray SPRAY 2 SPRAYS INTO EACH NOSTRIL EVERY DAY 48 mL 1   levocetirizine (XYZAL) 5 MG tablet Take 1 tablet (5 mg total) by mouth every evening. 30 tablet 1   losartan (COZAAR) 100 MG tablet Take 100 mg by mouth daily.     naproxen (NAPROSYN) 500 MG tablet Take 1 tablet (500 mg total) by mouth 2 (two) times daily with a meal. 20 tablet 0   omeprazole (PRILOSEC) 20 MG capsule TAKE 1 CAPSULE BY MOUTH EVERY DAY 90 capsule 1   ondansetron (ZOFRAN-ODT) 4 MG disintegrating tablet Take 1 tablet (4 mg total) by mouth every 6 (six) hours as needed for nausea or vomiting. 15 tablet 0   oxyCODONE-acetaminophen (PERCOCET/ROXICET) 5-325 MG tablet Take 1 tablet by mouth every 6 (six) hours as needed for severe pain. 8 tablet 0   phentermine (ADIPEX-P) 37.5 MG tablet Take 37.5 mg by mouth daily.     PRESCRIPTION MEDICATION darvocet     PROBIOTIC PRODUCT PO Take 1 capsule by mouth daily.     sertraline (ZOLOFT) 25 MG tablet TAKE 1 TABLET (25 MG TOTAL) BY MOUTH DAILY. 90 tablet 2   spironolactone (ALDACTONE) 50 MG tablet Take 50 mg by mouth daily.     tiZANidine (ZANAFLEX) 4 MG tablet TAKE 1 TABLET BY MOUTH EVERY 6 HOURS AS NEEDED FOR MUSCLE SPASMS. 30 tablet 0   topiramate (TOPAMAX) 100 MG tablet Take 1 tablet (100 mg total) by mouth 2 (two) times daily. 30 tablet 1   UNABLE TO FIND Med Name: Right knee brace ICD: W19.XXXA 1 each 0   UNABLE TO FIND Med Name:  metal hinges to support the knee W19.XXA M25.561 1 each 0   No current facility-administered medications for this visit.    Allergies: Allergies  Allergen Reactions   Penicillins Hives and Rash    Hives   Morphine Nausea And Vomiting   Morphine And Codeine Nausea And Vomiting    Past Medical History:  Diagnosis Date   Hypertension    PE (pulmonary embolism)    Past  Surgical History:  Procedure Laterality Date   CESAREAN SECTION     CHOLECYSTECTOMY  1998   INCISION AND DRAINAGE     No family history on file. Social History   Socioeconomic History   Marital status: Legally Separated    Spouse name: Not on file   Number of children: Not on file   Years of education: Not on file   Highest education level: Not on file  Occupational History   Not on file  Tobacco Use   Smoking status: Never   Smokeless tobacco: Never  Substance and Sexual Activity   Alcohol use: No   Drug use: No   Sexual activity: Not on file  Other Topics Concern   Not on file  Social History Narrative   Not on file   Social Determinants of Health   Financial Resource Strain: Not on file  Food Insecurity: No Food Insecurity (01/26/2022)   Hunger Vital Sign    Worried About Running Out of Food in the Last Year: Never true    Ran Out of  Food in the Last Year: Never true  Transportation Needs: No Transportation Needs (01/26/2022)   PRAPARE - Administrator, Civil Service (Medical): No    Lack of Transportation (Non-Medical): No  Physical Activity: Not on file  Stress: Not on file  Social Connections: Not on file  Intimate Partner Violence: Not At Risk (01/26/2022)   Humiliation, Afraid, Rape, and Kick questionnaire    Fear of Current or Ex-Partner: No    Emotionally Abused: No    Physically Abused: No    Sexually Abused: No    SUBJECTIVE  Review of Systems Constitutional: Patient ***denies any unintentional weight loss or change in strength lntegumentary: Patient ***denies any rashes or pruritus Eyes: Patient denies ***dry eyes ENT: Patient ***denies dry mouth Cardiovascular: Patient ***denies chest pain or syncope Respiratory: Patient ***denies shortness of breath Gastrointestinal: Patient ***denies nausea, vomiting, constipation, or diarrhea Musculoskeletal: Patient ***denies muscle cramps or weakness Neurologic: Patient ***denies convulsions  or seizures Psychiatric: Patient ***denies memory problems Allergic/Immunologic: Patient ***denies recent allergic reaction(s) Hematologic/Lymphatic: Patient denies bleeding tendencies Endocrine: Patient ***denies heat/cold intolerance  GU: As per HPI.  OBJECTIVE There were no vitals filed for this visit. There is no height or weight on file to calculate BMI.  Physical Examination  Constitutional: ***No obvious distress; patient is ***non-toxic appearing  Cardiovascular: ***No visible lower extremity edema.  Respiratory: The patient does ***not have audible wheezing/stridor; respirations do ***not appear labored  Gastrointestinal: Abdomen ***non-distended Musculoskeletal: ***Normal ROM of UEs  Skin: ***No obvious rashes/open sores  Neurologic: CN 2-12 grossly ***intact Psychiatric: Answered questions ***appropriately with ***normal affect  Hematologic/Lymphatic/Immunologic: ***No obvious bruises or sites of spontaneous bleeding  UA: {Desc; negative/positive:13464} for *** WBC/hpf, *** RBC/hpf, bacteria (***) PVR: *** ml  ASSESSMENT No diagnosis found. *** 1. Recurrent UTls:  We discussed the possible etiologies of recurrent UTls including ascending infection related to intercourse; vaginal atrophy; transmural infection that has been treated incompletely; urinary tract stones; incomplete bladder emptying with urinary stasis; kidney or bladder tumor; urethral diverticulum; and colonization of  vagina and urinary tract with pathologic, adherent organisms.   For UTI prevention we discussed options including: Adequate fluid intake (>1.5 liters/day) to flush out the urinary tract. - Go to the bathroom to urinate every 4-6 hours while awake to minimize urinary stasis / bacterial overgrowth in the bladder. - Proanthocyanidin (PAC) supplement 36 mg daily; must be soluble (insoluble form of PAC will be ineffective). Recommend Ellura. D-mannose 2 g daily Vitamin C supplement Probiotic to  maintain healthy vaginal microbiome ***- UTI prophylaxis with antibiotic after intercourse.  If recurrent UTls persist, may consider UTI prophylaxis with a daily low dose antibiotic in the future.  For management of acute UTI symptoms: - Patient was advised that if/when UTI-like symptoms occur they can call our office to speak with a nurse, who can place an order for urinalysis (with reflex to urine culture). They may then proceed to a [facility] to provide their urine sample. This is required for evaluation in order for their Urology provider to make informed treatment recommendation(s), which may or may not include an antibiotic prescription. - Discussed option to take over-the-counter Pyridium (commonly known under the AZO brand name) for bladder pain. No more than 3 days at a time.  Patient ***will consider OTC supplements for UTI prevention. ***Handout provided.  2. Kidney stones.  ***We discussed CT abdomen/pelvis w/ contrast findings from 08/29/2022 with evidence of small non-obstructing left lower pole kidney stones.  - ***Advised KUB in  6 months for stone surveillance.  - ***Advised adequate hydration and we discussed option to consider low oxalate diet given that calcium oxalate is the most common type of stone. ***Handout provided about stone prevention diet.  Will plan for follow up in ***6 months or sooner if needed. Pt verbalized understanding and agreement. All questions were answered.  PLAN Advised the following: 1. Adequate fluid intake (>1.5 liters/day) to flush out the urinary tract. 2. Urinate every 4-6 hours while awake to minimize urinary stasis/ bacterial overgrowth in the bladder. 3. Consider OTC supplements for UTI prevention. 4. ***No follow-ups on file.  No orders of the defined types were placed in this encounter.   It has been explained that the patient is to follow regularly with their PCP in addition to all other providers involved in their care  and to follow instructions provided by these respective offices. Patient advised to contact urology clinic if any urologic-pertaining questions, concerns, new symptoms or problems arise in the interim period.  There are no Patient Instructions on file for this visit.  Electronically signed by:  Donnita Falls, MSN, FNP-C, CUNP 10/24/2022 4:53 PM

## 2022-10-25 ENCOUNTER — Ambulatory Visit: Payer: Medicaid Other | Admitting: Urology

## 2022-10-25 DIAGNOSIS — N200 Calculus of kidney: Secondary | ICD-10-CM

## 2022-10-25 DIAGNOSIS — N390 Urinary tract infection, site not specified: Secondary | ICD-10-CM

## 2022-11-01 ENCOUNTER — Ambulatory Visit: Payer: Medicaid Other | Admitting: Family Medicine

## 2022-11-01 ENCOUNTER — Telehealth: Payer: Self-pay

## 2022-11-01 NOTE — Telephone Encounter (Signed)
error 

## 2022-11-03 ENCOUNTER — Encounter: Payer: Self-pay | Admitting: Family Medicine

## 2022-11-30 ENCOUNTER — Other Ambulatory Visit: Payer: Self-pay | Admitting: Nurse Practitioner

## 2022-11-30 DIAGNOSIS — Z1231 Encounter for screening mammogram for malignant neoplasm of breast: Secondary | ICD-10-CM

## 2022-12-21 ENCOUNTER — Other Ambulatory Visit: Payer: Self-pay | Admitting: Geriatric Medicine

## 2022-12-21 DIAGNOSIS — R7401 Elevation of levels of liver transaminase levels: Secondary | ICD-10-CM

## 2022-12-25 ENCOUNTER — Encounter: Payer: Self-pay | Admitting: *Deleted

## 2022-12-26 ENCOUNTER — Ambulatory Visit
Admission: RE | Admit: 2022-12-26 | Discharge: 2022-12-26 | Disposition: A | Discharge status: Home / Self Care | Payer: Medicaid Other | Source: Ambulatory Visit | Attending: Geriatric Medicine | Admitting: Geriatric Medicine

## 2022-12-26 DIAGNOSIS — R7401 Elevation of levels of liver transaminase levels: Secondary | ICD-10-CM

## 2022-12-27 ENCOUNTER — Ambulatory Visit: Payer: Medicaid Other

## 2023-02-01 ENCOUNTER — Ambulatory Visit: Payer: Medicaid Other

## 2023-02-16 ENCOUNTER — Ambulatory Visit
Admission: EM | Admit: 2023-02-16 | Discharge: 2023-02-16 | Disposition: A | Discharge status: Home / Self Care | Payer: Medicaid Other | Attending: Family Medicine | Admitting: Family Medicine

## 2023-02-16 DIAGNOSIS — J069 Acute upper respiratory infection, unspecified: Secondary | ICD-10-CM | POA: Diagnosis not present

## 2023-02-16 MED ORDER — PREDNISONE 20 MG PO TABS: 40.0000 mg | ORAL_TABLET | Freq: Every day | ORAL | 0 refills | Status: DC

## 2023-02-16 MED ORDER — ALBUTEROL SULFATE HFA 108 (90 BASE) MCG/ACT IN AERS: 2.0000 | INHALATION_SPRAY | RESPIRATORY_TRACT | 0 refills | Status: AC | PRN

## 2023-02-16 MED ORDER — PROMETHAZINE-DM 6.25-15 MG/5ML PO SYRP: 5.0000 mL | ORAL_SOLUTION | Freq: Four times a day (QID) | ORAL | 0 refills | Status: DC | PRN

## 2023-02-16 NOTE — ED Provider Notes (Signed)
[facility]    CSN: [id number] Arrival date & time: 02/16/23  1445      History   Chief Complaint No chief complaint on file.   HPI Tracie Green is a 47 y.o. female.   Patient presenting today with cough, congestion, headache for the past 4 days.  Denies fever, chills, chest pain, shortness of breath, abdominal pain, nausea vomiting or diarrhea.  So far trying over-the-counter cold and congestion medication with minimal relief.  No known history of chronic pulmonary disease per patient.  Multiple sick contacts.    Past Medical History:  Diagnosis Date   Hypertension    PE (pulmonary embolism)     Patient Active Problem List   Diagnosis Date Noted   Obesity 09/08/2022   Kidney stones 09/08/2022   History of cocaine use 09/08/2022   Recurrent UTI 07/28/2022   GERD (gastroesophageal reflux disease) 07/28/2022   Left-sided weakness 03/09/2022   Panic attacks 03/09/2022   Stroke-like episode s/p TNK 01/25/2022   Back pain 01/25/2022   Abdominal pain 12/25/2021   Allergic rhinitis 12/25/2021   Candidal intertrigo 12/25/2021   Vaginal discharge 12/25/2021   Essential hypertension 01/03/2020   Right sided abdominal pain 01/03/2020    Past Surgical History:  Procedure Laterality Date   CESAREAN SECTION     CHOLECYSTECTOMY  1998   INCISION AND DRAINAGE      OB History   No obstetric history on file.      Home Medications    Prior to Admission medications   Medication Sig Start Date End Date Taking? Authorizing Provider  albuterol (VENTOLIN HFA) 108 (90 Base) MCG/ACT inhaler Inhale 2 puffs into the lungs every 4 (four) hours as needed. 02/16/23  Yes Particia Nearing, PA-C  predniSONE (DELTASONE) 20 MG tablet Take 2 tablets (40 mg total) by mouth daily with breakfast. 02/16/23  Yes Particia Nearing, PA-C  promethazine-dextromethorphan (PROMETHAZINE-DM) 6.25-15 MG/5ML syrup Take 5 mLs by mouth 4 (four) times daily as needed. 02/16/23  Yes  Particia Nearing, PA-C  atorvastatin (LIPITOR) 40 MG tablet Take 1 tablet (40 mg total) by mouth daily. 01/27/22   Elmer Picker, NP  clotrimazole (LOTRIMIN) 1 % cream Apply 1 Application topically 2 (two) times daily. 02/20/22   Gardenia Phlegm, MD  dextromethorphan 15 MG/5ML syrup Take 10 mLs (30 mg total) by mouth 4 (four) times daily as needed for cough. 02/20/22   Gardenia Phlegm, MD  dicyclomine (BENTYL) 20 MG tablet Take 1 tablet (20 mg total) by mouth 3 (three) times daily as needed for up to 21 doses for spasms. 01/18/22   Terald Sleeper, MD  famotidine (PEPCID) 20 MG tablet Take 1 tablet (20 mg total) by mouth daily for 30 doses. 08/29/22 09/28/22  Carmel Sacramento A, PA-C  fluticasone (FLONASE) 50 MCG/ACT nasal spray SPRAY 2 SPRAYS INTO EACH NOSTRIL EVERY DAY 05/23/22   Gilmore Laroche, FNP  levocetirizine (XYZAL) 5 MG tablet Take 1 tablet (5 mg total) by mouth every evening. 06/02/22   Gilmore Laroche, FNP  losartan (COZAAR) 100 MG tablet Take 100 mg by mouth daily. 06/26/22   [provider]  naproxen (NAPROSYN) 500 MG tablet Take 1 tablet (500 mg total) by mouth 2 (two) times daily with a meal. 03/17/22   Gilmore Laroche, FNP  omeprazole (PRILOSEC) 20 MG capsule TAKE 1 CAPSULE BY MOUTH EVERY DAY 10/23/22   Gilmore Laroche, FNP  ondansetron (ZOFRAN-ODT) 4 MG disintegrating tablet Take 1 tablet (4 mg total) by  mouth every 6 (six) hours as needed for nausea or vomiting. 08/29/22   Carmel Sacramento A, PA-C  oxyCODONE-acetaminophen (PERCOCET/ROXICET) 5-325 MG tablet Take 1 tablet by mouth every 6 (six) hours as needed for severe pain. 07/28/22   Gilmore Laroche, FNP  phentermine (ADIPEX-P) 37.5 MG tablet Take 37.5 mg by mouth daily. 05/29/22   [provider]  PRESCRIPTION MEDICATION darvocet    [provider]  PROBIOTIC PRODUCT PO Take 1 capsule by mouth daily.    [provider]  sertraline (ZOLOFT) 25 MG tablet TAKE 1 TABLET (25 MG TOTAL) BY MOUTH DAILY.  04/10/22   Gilmore Laroche, FNP  spironolactone (ALDACTONE) 50 MG tablet Take 50 mg by mouth daily. 06/26/22   [provider]  tiZANidine (ZANAFLEX) 4 MG tablet TAKE 1 TABLET BY MOUTH EVERY 6 HOURS AS NEEDED FOR MUSCLE SPASMS. 10/03/22   Gilmore Laroche, FNP  topiramate (TOPAMAX) 100 MG tablet Take 1 tablet (100 mg total) by mouth 2 (two) times daily. 01/26/22   Elmer Picker, NP  UNABLE TO FIND Med Name: Right knee brace ICD: W19.Lorne Skeens 06/09/22   Gilmore Laroche, FNP  UNABLE TO FIND Med Name:  metal hinges to support the knee W19.Huel Cote Z61.096 06/12/22   Gilmore Laroche, FNP    Family History History reviewed. No pertinent family history.  Social History Social History   Tobacco Use   Smoking status: Never   Smokeless tobacco: Never  Substance Use Topics   Alcohol use: No   Drug use: No     Allergies   Penicillins, Morphine, and Morphine and codeine  Review of Systems Review of Systems Per HPI  Physical Exam Triage Vital Signs ED Triage Vitals  Encounter Vitals Group     BP 02/16/23 1544 (!) 164/96     Systolic BP Percentile --      Diastolic BP Percentile --      Pulse Rate 02/16/23 1544 91     Resp 02/16/23 1544 20     Temp 02/16/23 1544 97.8 F (36.6 C)     Temp Source 02/16/23 1544 Oral     SpO2 02/16/23 1544 97 %     Weight --      Height --      Head Circumference --      Peak Flow --      Pain Score 02/16/23 1541 7     Pain Loc --      Pain Education --      Exclude from Growth Chart --    No data found.  Updated Vital Signs BP (!) 164/96 (BP Location: Right Wrist)   Pulse 91   Temp 97.8 F (36.6 C) (Oral)   Resp 20   LMP 02/06/2023 (Approximate)   SpO2 97%   Visual Acuity Right Eye Distance:   Left Eye Distance:   Bilateral Distance:    Right Eye Near:   Left Eye Near:    Bilateral Near:     Physical Exam Vitals and nursing note reviewed.  Constitutional:      Appearance: Normal appearance.  HENT:     Head: Atraumatic.     Right  Ear: External ear normal.     Left Ear: External ear normal.     Ears:     Comments: Very mild middle ear effusion bilaterally    Nose: Rhinorrhea present.     Mouth/Throat:     Mouth: Mucous membranes are moist.     Pharynx: Posterior oropharyngeal erythema present.  Eyes:  Extraocular Movements: Extraocular movements intact.     Conjunctiva/sclera: Conjunctivae normal.  Cardiovascular:     Rate and Rhythm: Normal rate and regular rhythm.     Heart sounds: Normal heart sounds.  Pulmonary:     Effort: Pulmonary effort is normal.     Breath sounds: Normal breath sounds. No wheezing or rales.  Musculoskeletal:        General: Normal range of motion.     Cervical back: Normal range of motion and neck supple.  Skin:    General: Skin is warm and dry.  Neurological:     Mental Status: She is alert and oriented to person, place, and time.  Psychiatric:        Mood and Affect: Mood normal.        Thought Content: Thought content normal.      UC Treatments / Results  Labs (all labs ordered are listed, but only abnormal results are displayed) Labs Reviewed - No data to display  EKG   Radiology No results found.  Procedures Procedures (including critical care time)  Medications Ordered in UC Medications - No data to display  Initial Impression / Assessment and Plan / UC Course  I have reviewed the triage vital signs and the nursing notes.  Pertinent labs & imaging results that were available during my care of the patient were reviewed by me and considered in my medical decision making (see chart for details).     Hypertensive in triage, otherwise vital signs within normal limits.  She is well-appearing and in no acute distress.  Suspect viral respiratory infection.  Treat with Phenergan DM, very short course of prednisone as she is having to mild eustachian tube dysfunction and she is requesting an albuterol inhaler additionally.  Return for worsening symptoms. Final  Clinical Impressions(s) / UC Diagnoses   Final diagnoses:  Viral URI with cough   Discharge Instructions   None    ED Prescriptions     Medication Sig Dispense Auth. Provider   albuterol (VENTOLIN HFA) 108 (90 Base) MCG/ACT inhaler Inhale 2 puffs into the lungs every 4 (four) hours as needed. 18 g Roosvelt Maser [place], [place]   promethazine-dextromethorphan (PROMETHAZINE-DM) 6.25-15 MG/5ML syrup Take 5 mLs by mouth 4 (four) times daily as needed. 100 mL Particia Nearing, PA-C   predniSONE (DELTASONE) 20 MG tablet Take 2 tablets (40 mg total) by mouth daily with breakfast. 6 tablet Particia Nearing, [place]      PDMP not reviewed this encounter.   Particia Nearing, [place] 02/16/23 1610

## 2023-02-16 NOTE — ED Triage Notes (Signed)
Pt reports she has coughing, nasal congestion, and headaches x 4 days

## 2023-02-27 ENCOUNTER — Ambulatory Visit: Payer: Medicaid Other

## 2023-02-28 ENCOUNTER — Ambulatory Visit: Payer: Medicaid Other

## 2023-03-14 ENCOUNTER — Ambulatory Visit: Payer: Medicaid Other

## 2023-04-12 ENCOUNTER — Ambulatory Visit: Payer: Medicaid Other

## 2023-04-20 ENCOUNTER — Other Ambulatory Visit: Payer: Self-pay | Admitting: Family Medicine

## 2023-04-26 ENCOUNTER — Ambulatory Visit
Admission: RE | Admit: 2023-04-26 | Discharge: 2023-04-26 | Disposition: A | Discharge status: Home / Self Care | Payer: MEDICAID | Source: Ambulatory Visit | Attending: Nurse Practitioner | Admitting: Nurse Practitioner

## 2023-04-26 ENCOUNTER — Ambulatory Visit: Payer: Medicaid Other

## 2023-04-26 ENCOUNTER — Ambulatory Visit
Admission: RE | Admit: 2023-04-26 | Discharge: 2023-04-26 | Disposition: A | Discharge status: Home / Self Care | Payer: Medicaid Other | Source: Ambulatory Visit | Attending: Nurse Practitioner | Admitting: Nurse Practitioner

## 2023-04-26 DIAGNOSIS — Z6841 Body Mass Index (BMI) 40.0 and over, adult: Secondary | ICD-10-CM

## 2023-04-26 DIAGNOSIS — Z1231 Encounter for screening mammogram for malignant neoplasm of breast: Secondary | ICD-10-CM

## 2023-05-17 ENCOUNTER — Emergency Department (HOSPITAL_COMMUNITY)
Admission: EM | Admit: 2023-05-17 | Discharge: 2023-05-18 | Disposition: A | Discharge status: Home / Self Care | Payer: MEDICAID | Attending: Emergency Medicine | Admitting: Emergency Medicine

## 2023-05-17 ENCOUNTER — Encounter (HOSPITAL_COMMUNITY): Payer: Self-pay

## 2023-05-17 ENCOUNTER — Other Ambulatory Visit: Payer: Self-pay

## 2023-05-17 ENCOUNTER — Emergency Department (HOSPITAL_COMMUNITY): Payer: MEDICAID

## 2023-05-17 DIAGNOSIS — N3000 Acute cystitis without hematuria: Secondary | ICD-10-CM | POA: Insufficient documentation

## 2023-05-17 DIAGNOSIS — J101 Influenza due to other identified influenza virus with other respiratory manifestations: Secondary | ICD-10-CM | POA: Insufficient documentation

## 2023-05-17 DIAGNOSIS — J020 Streptococcal pharyngitis: Secondary | ICD-10-CM | POA: Diagnosis not present

## 2023-05-17 DIAGNOSIS — Z79899 Other long term (current) drug therapy: Secondary | ICD-10-CM | POA: Insufficient documentation

## 2023-05-17 DIAGNOSIS — I10 Essential (primary) hypertension: Secondary | ICD-10-CM | POA: Insufficient documentation

## 2023-05-17 DIAGNOSIS — D72819 Decreased white blood cell count, unspecified: Secondary | ICD-10-CM | POA: Insufficient documentation

## 2023-05-17 DIAGNOSIS — R059 Cough, unspecified: Secondary | ICD-10-CM | POA: Diagnosis present

## 2023-05-17 LAB — CBC WITH DIFFERENTIAL/PLATELET
Abs Immature Granulocytes: 0.01 10*3/uL (ref 0.00–0.07)
Basophils Absolute: 0 10*3/uL (ref 0.0–0.1)
Basophils Relative: 1 %
Eosinophils Absolute: 0.2 10*3/uL (ref 0.0–0.5)
Eosinophils Relative: 6 %
HCT: 41.8 % (ref 36.0–46.0)
Hemoglobin: 13.3 g/dL (ref 12.0–15.0)
Immature Granulocytes: 0 %
Lymphocytes Relative: 50 %
Lymphs Abs: 1.3 10*3/uL (ref 0.7–4.0)
MCH: 27.2 pg (ref 26.0–34.0)
MCHC: 31.8 g/dL (ref 30.0–36.0)
MCV: 85.5 fL (ref 80.0–100.0)
Monocytes Absolute: 0.3 10*3/uL (ref 0.1–1.0)
Monocytes Relative: 12 %
Neutro Abs: 0.8 10*3/uL — ABNORMAL LOW (ref 1.7–7.7)
Neutrophils Relative %: 31 %
Platelets: 260 10*3/uL (ref 150–400)
RBC: 4.89 MIL/uL (ref 3.87–5.11)
RDW: 14.2 % (ref 11.5–15.5)
WBC: 2.7 10*3/uL — ABNORMAL LOW (ref 4.0–10.5)
nRBC: 0 % (ref 0.0–0.2)

## 2023-05-17 LAB — PREGNANCY, URINE: Preg Test, Ur: NEGATIVE

## 2023-05-17 LAB — BASIC METABOLIC PANEL
Anion gap: 7 (ref 5–15)
BUN: 13 mg/dL (ref 6–20)
CO2: 28 mmol/L (ref 22–32)
Calcium: 8.3 mg/dL — ABNORMAL LOW (ref 8.9–10.3)
Chloride: 100 mmol/L (ref 98–111)
Creatinine, Ser: 1.18 mg/dL — ABNORMAL HIGH (ref 0.44–1.00)
GFR, Estimated: 57 mL/min — ABNORMAL LOW (ref >60)
Glucose, Bld: 89 mg/dL (ref 70–99)
Potassium: 4.1 mmol/L (ref 3.5–5.1)
Sodium: 135 mmol/L (ref 135–145)

## 2023-05-17 LAB — RESP PANEL BY RT-PCR (RSV, FLU A&B, COVID)  RVPGX2
Influenza A by PCR: POSITIVE — AB
Influenza B by PCR: NEGATIVE
Resp Syncytial Virus by PCR: NEGATIVE
SARS Coronavirus 2 by RT PCR: NEGATIVE

## 2023-05-17 LAB — URINALYSIS, ROUTINE W REFLEX MICROSCOPIC
Bilirubin Urine: NEGATIVE
Glucose, UA: NEGATIVE mg/dL
Hgb urine dipstick: NEGATIVE
Ketones, ur: NEGATIVE mg/dL
Leukocytes,Ua: NEGATIVE
Nitrite: POSITIVE — AB
Protein, ur: 30 mg/dL — AB
Specific Gravity, Urine: 1.021 (ref 1.005–1.030)
pH: 5 (ref 5.0–8.0)

## 2023-05-17 LAB — TROPONIN I (HIGH SENSITIVITY): Troponin I (High Sensitivity): 5 ng/L (ref <18)

## 2023-05-17 LAB — GROUP A STREP BY PCR: Group A Strep by PCR: DETECTED — AB

## 2023-05-17 MED ORDER — ALBUTEROL SULFATE (2.5 MG/3ML) 0.083% IN NEBU
2.5000 mg | INHALATION_SOLUTION | Freq: Once | RESPIRATORY_TRACT | Status: AC
Start: 2023-05-17 — End: 2023-05-17
  Administered 2023-05-17: 2.5 mg via RESPIRATORY_TRACT
  Filled 2023-05-17: qty 3

## 2023-05-17 MED ORDER — ONDANSETRON HCL 4 MG/2ML IJ SOLN
4.0000 mg | Freq: Once | INTRAMUSCULAR | Status: AC
  Administered 2023-05-17: 4 mg via INTRAVENOUS
  Filled 2023-05-17: qty 2

## 2023-05-17 MED ORDER — IOHEXOL 350 MG/ML SOLN
100.0000 mL | Freq: Once | INTRAVENOUS | Status: AC | PRN
  Administered 2023-05-17: 100 mL via INTRAVENOUS

## 2023-05-17 MED ORDER — SULFAMETHOXAZOLE-TRIMETHOPRIM 800-160 MG PO TABS
1.0000 | ORAL_TABLET | Freq: Once | ORAL | Status: AC
  Administered 2023-05-17: 1 via ORAL
  Filled 2023-05-17: qty 1

## 2023-05-17 MED ORDER — SULFAMETHOXAZOLE-TRIMETHOPRIM 800-160 MG PO TABS: 1.0000 | ORAL_TABLET | Freq: Two times a day (BID) | ORAL | 0 refills | Status: AC

## 2023-05-17 MED ORDER — IPRATROPIUM-ALBUTEROL 0.5-2.5 (3) MG/3ML IN SOLN
3.0000 mL | Freq: Once | RESPIRATORY_TRACT | Status: AC
  Administered 2023-05-17: 3 mL via RESPIRATORY_TRACT
  Filled 2023-05-17: qty 3

## 2023-05-17 MED ORDER — AZITHROMYCIN 250 MG PO TABS
500.0000 mg | ORAL_TABLET | Freq: Once | ORAL | Status: AC
  Administered 2023-05-17: 500 mg via ORAL
  Filled 2023-05-17: qty 2

## 2023-05-17 MED ORDER — AZITHROMYCIN 250 MG PO TABS: 500.0000 mg | ORAL_TABLET | Freq: Every day | ORAL | 0 refills | Status: AC

## 2023-05-17 MED ORDER — SODIUM CHLORIDE 0.9 % IV BOLUS
1000.0000 mL | Freq: Once | INTRAVENOUS | Status: AC
  Administered 2023-05-17: 1000 mL via INTRAVENOUS

## 2023-05-17 NOTE — ED Notes (Signed)
Patient transported to CT via stretcher in NAD.

## 2023-05-17 NOTE — ED Provider Notes (Signed)
[facility] AT [facility] Provider Note   CSN: [id number] Arrival date & time: 05/17/23  1600     History  Chief Complaint  Patient presents with   Cough    Tracie Green is a 48 y.o. female with a history including GERD, hypertension and distant PE as a perinatal complication presenting for evaluation of cough, general myalgias, generalized fatigue and shortness of breath.  She has had subjective fever with all of her body aches and cough which has been nonproductive.  She also endorses increased urinary frequency and urgency, moderate sore throat.  She suspects she has been running fevers but has not measured her temperature at home.  She has taken generic over-the-counter fever reducers with cough and cold formula as with minimal improvement in her symptoms.  The history is provided by the patient.       Home Medications Prior to Admission medications   Medication Sig Start Date End Date Taking? Authorizing Provider  azithromycin (ZITHROMAX) 250 MG tablet Take 2 tablets (500 mg total) by mouth daily for 2 days. Take first 2 tablets together, then 1 every day until finished. 05/18/23 05/20/23 Yes Jencarlo Bonadonna, Raynelle Fanning, PA-C  sulfamethoxazole-trimethoprim (BACTRIM DS) 800-160 MG tablet Take 1 tablet by mouth 2 (two) times daily for 5 days. 05/17/23 05/22/23 Yes Gaelan Glennon, Raynelle Fanning, PA-C  albuterol (VENTOLIN HFA) 108 (90 Base) MCG/ACT inhaler Inhale 2 puffs into the lungs every 4 (four) hours as needed. 02/16/23   Particia Nearing, PA-C  atorvastatin (LIPITOR) 40 MG tablet Take 1 tablet (40 mg total) by mouth daily. 01/27/22   Elmer Picker, NP  clotrimazole (LOTRIMIN) 1 % cream Apply 1 Application topically 2 (two) times daily. 02/20/22   Gardenia Phlegm, MD  dextromethorphan 15 MG/5ML syrup Take 10 mLs (30 mg total) by mouth 4 (four) times daily as needed for cough. 02/20/22   Gardenia Phlegm, MD  dicyclomine (BENTYL) 20 MG tablet Take 1 tablet (20 mg total) by mouth 3  (three) times daily as needed for up to 21 doses for spasms. 01/18/22   Terald Sleeper, MD  famotidine (PEPCID) 20 MG tablet Take 1 tablet (20 mg total) by mouth daily for 30 doses. 08/29/22 09/28/22  Carmel Sacramento A, PA-C  fluticasone (FLONASE) 50 MCG/ACT nasal spray SPRAY 2 SPRAYS INTO EACH NOSTRIL EVERY DAY 05/23/22   Gilmore Laroche, FNP  levocetirizine (XYZAL) 5 MG tablet Take 1 tablet (5 mg total) by mouth every evening. 06/02/22   Gilmore Laroche, FNP  losartan (COZAAR) 100 MG tablet Take 100 mg by mouth daily. 06/26/22   [provider]  naproxen (NAPROSYN) 500 MG tablet Take 1 tablet (500 mg total) by mouth 2 (two) times daily with a meal. 03/17/22   Gilmore Laroche, FNP  omeprazole (PRILOSEC) 20 MG capsule TAKE 1 CAPSULE BY MOUTH EVERY DAY 10/23/22   Gilmore Laroche, FNP  ondansetron (ZOFRAN-ODT) 4 MG disintegrating tablet Take 1 tablet (4 mg total) by mouth every 6 (six) hours as needed for nausea or vomiting. 08/29/22   Carmel Sacramento A, PA-C  oxyCODONE-acetaminophen (PERCOCET/ROXICET) 5-325 MG tablet Take 1 tablet by mouth every 6 (six) hours as needed for severe pain. 07/28/22   Gilmore Laroche, FNP  phentermine (ADIPEX-P) 37.5 MG tablet Take 37.5 mg by mouth daily. 05/29/22   [provider]  predniSONE (DELTASONE) 20 MG tablet Take 2 tablets (40 mg total) by mouth daily with breakfast. 02/16/23   Particia Nearing, PA-C  PRESCRIPTION MEDICATION darvocet  [provider]  PROBIOTIC PRODUCT PO Take 1 capsule by mouth daily.    [provider]  promethazine-dextromethorphan (PROMETHAZINE-DM) 6.25-15 MG/5ML syrup Take 5 mLs by mouth 4 (four) times daily as needed. 02/16/23   Particia Nearing, PA-C  sertraline (ZOLOFT) 25 MG tablet TAKE 1 TABLET (25 MG TOTAL) BY MOUTH DAILY. 04/10/22   Gilmore Laroche, FNP  spironolactone (ALDACTONE) 50 MG tablet Take 50 mg by mouth daily. 06/26/22   [provider]  tiZANidine (ZANAFLEX) 4 MG tablet TAKE  1 TABLET BY MOUTH EVERY 6 HOURS AS NEEDED FOR MUSCLE SPASMS. 10/03/22   Gilmore Laroche, FNP  topiramate (TOPAMAX) 100 MG tablet Take 1 tablet (100 mg total) by mouth 2 (two) times daily. 01/26/22   Elmer Picker, NP  UNABLE TO FIND Med Name: Right knee brace ICD: W19.Lorne Skeens 06/09/22   Gilmore Laroche, FNP  UNABLE TO FIND Med Name:  metal hinges to support the knee W19.Huel Cote W09.811 06/12/22   Gilmore Laroche, FNP      Allergies    Penicillins, Morphine, and Morphine and codeine    Review of Systems   Review of Systems  Constitutional:  Positive for chills, fatigue and fever.  HENT:  Positive for sore throat. Negative for congestion.   Eyes: Negative.   Respiratory:  Positive for cough and shortness of breath. Negative for chest tightness.   Gastrointestinal:  Negative for abdominal pain, nausea and vomiting.  Genitourinary:  Positive for dysuria and urgency.  Musculoskeletal:  Negative for arthralgias, joint swelling and neck pain.  Skin: Negative.  Negative for rash and wound.  Neurological:  Negative for dizziness, weakness, light-headedness, numbness and headaches.  Psychiatric/Behavioral: Negative.      Physical Exam Updated Vital Signs BP 139/71 (BP Location: Left Arm)   Pulse 68   Temp 98.9 F (37.2 C)   Resp 18   Ht 5\' 10"  (1.778 m)   Wt (!) 164 kg   LMP 04/13/2023   SpO2 98%   BMI 51.88 kg/m  Physical Exam Vitals and nursing note reviewed.  Constitutional:      Appearance: She is well-developed.  HENT:     Head: Normocephalic and atraumatic.     Mouth/Throat:     Mouth: Mucous membranes are moist.     Pharynx: Posterior oropharyngeal erythema present. No oropharyngeal exudate.  Eyes:     Conjunctiva/sclera: Conjunctivae normal.  Cardiovascular:     Rate and Rhythm: Normal rate and regular rhythm.     Heart sounds: Normal heart sounds.  Pulmonary:     Effort: Pulmonary effort is normal.     Breath sounds: Normal breath sounds. No wheezing or rhonchi.  Abdominal:      General: Bowel sounds are normal.     Palpations: Abdomen is soft.     Tenderness: There is no abdominal tenderness. There is no guarding.  Musculoskeletal:        General: Normal range of motion.     Cervical back: Normal range of motion.  Skin:    General: Skin is warm and dry.  Neurological:     Mental Status: She is alert.     ED Results / Procedures / Treatments   Labs (all labs ordered are listed, but only abnormal results are displayed) Labs Reviewed  RESP PANEL BY RT-PCR (RSV, FLU A&B, COVID)  RVPGX2 - Abnormal; Notable for the following components:      Result Value   Influenza A by PCR POSITIVE (*)    All other components  within normal limits  GROUP A STREP BY PCR - Abnormal; Notable for the following components:   Group A Strep by PCR DETECTED (*)    All other components within normal limits  URINALYSIS, ROUTINE W REFLEX MICROSCOPIC - Abnormal; Notable for the following components:   Color, Urine AMBER (*)    APPearance HAZY (*)    Protein, ur 30 (*)    Nitrite POSITIVE (*)    Bacteria, UA RARE (*)    All other components within normal limits  CBC WITH DIFFERENTIAL/PLATELET - Abnormal; Notable for the following components:   WBC 2.7 (*)    Neutro Abs 0.8 (*)    All other components within normal limits  BASIC METABOLIC PANEL - Abnormal; Notable for the following components:   Creatinine, Ser 1.18 (*)    Calcium 8.3 (*)    GFR, Estimated 57 (*)    All other components within normal limits  PREGNANCY, URINE  TROPONIN I (HIGH SENSITIVITY)    EKG None  Radiology CT Angio Chest PE W and/or Wo Contrast Result Date: 05/17/2023 CLINICAL DATA:  Cough, shortness of breath EXAM: CT ANGIOGRAPHY CHEST WITH CONTRAST TECHNIQUE: Multidetector CT imaging of the chest was performed using the standard protocol during bolus administration of intravenous contrast. Multiplanar CT image reconstructions and MIPs were obtained to evaluate the vascular anatomy. RADIATION DOSE  REDUCTION: This exam was performed according to the departmental dose-optimization program which includes automated exposure control, adjustment of the mA and/or kV according to patient size and/or use of iterative reconstruction technique. CONTRAST:  OMNIPAQUE IOHEXOL 350 MG/ML SOLN COMPARISON:  09/16/2022 FINDINGS: Cardiovascular: Heart is normal size. Aorta is normal caliber. No filling defects in the pulmonary arteries to suggest pulmonary emboli. Mediastinum/Nodes: No mediastinal, hilar, or axillary adenopathy. Trachea and esophagus are unremarkable. Thyroid unremarkable. Lungs/Pleura: Right basilar density posteriorly is stable since prior study most compatible with scarring. No acute confluent opacities or effusions. Upper Abdomen: No acute findings Musculoskeletal: Chest wall soft tissues are unremarkable. No acute bony abnormality. Review of the MIP images confirms the above findings. IMPRESSION: No evidence of pulmonary embolus. Right basilar scarring. No acute cardiopulmonary disease. Electronically Signed   By: Charlett Nose M.D.   On: 05/17/2023 22:06   DG Chest 2 View Result Date: 05/17/2023 CLINICAL DATA:  Cough.  Shortness of breath. EXAM: CHEST - 2 VIEW COMPARISON:  09/16/2022 FINDINGS: The cardiomediastinal contours are normal. The lungs are clear. Pulmonary vasculature is normal. No consolidation, pleural effusion, or pneumothorax. No acute osseous abnormalities are seen. IMPRESSION: No acute findings. Electronically Signed   By: Narda Rutherford M.D.   On: 05/17/2023 17:46    Procedures Procedures    Medications Ordered in ED Medications  ipratropium-albuterol (DUONEB) 0.5-2.5 (3) MG/3ML nebulizer solution 3 mL (has no administration in time range)  albuterol (PROVENTIL) (2.5 MG/3ML) 0.083% nebulizer solution 2.5 mg (has no administration in time range)  azithromycin (ZITHROMAX) tablet 500 mg (500 mg Oral Given 05/17/23 1947)  sulfamethoxazole-trimethoprim (BACTRIM DS) 800-160  MG per tablet 1 tablet (1 tablet Oral Given 05/17/23 1947)  ondansetron (ZOFRAN) injection 4 mg (4 mg Intravenous Given 05/17/23 2028)  sodium chloride 0.9 % bolus 1,000 mL (1,000 mLs Intravenous New Bag/Given 05/17/23 2023)  iohexol (OMNIPAQUE) 350 MG/ML injection 100 mL (100 mLs Intravenous Contrast Given 05/17/23 2138)    ED Course/ Medical Decision Making/ A&P  Medical Decision Making Patient notes reviewed with patient including positive for UTI, strep throat and influenza.  She now has complaints of left-sided chest pressure stating this has actually has been present for 2 days, has pain with deep inspiration and with coughing.  Denies palpitations, with her history of PE we will get a CT scan, will need basic labs, will give IV fluids, also has complaints of nausea now will give Zofran while we are waiting further lab results.  10:55 PM At re-exam, after Ct angio back and negative, pt still with c/o sob,  new wheezing which wasn't appreciated earlier.  Albuterol/atrovent neb ordered.    Pt discussed with Dr. Clayborne Dana who assumes care. If pt's wheezing improved after neb tx, she can probably be dc'd home.   Amount and/or Complexity of Data Reviewed Labs: ordered.    Details: Significant labs including a leukopenia with a WBC count of 2.7, consistent with a viral process, she is positive for influenza A, but she is also positive for group A strep.  Her troponin is negative at 5, no delta troponin indicated as she endorses pain has been present for several days.  Her creatinine is 1.18 which is in line with prior recent creatinine measures.  Urinalysis revealing for 30 protein, she is nitrite positive with 11-20 RBCs, rare bacteria.  Given her complaint of urinary frequency will treat this with antibiotics. Radiology: ordered.    Details: Initial chest x-ray is negative for pneumonia or other acute process.  After patient included history of chest pain and given her  history of PE a CT angio was completed, negative for PE, also negative for early pneumonia.  Risk Prescription drug management.           Final Clinical Impression(s) / ED Diagnoses Final diagnoses:  Influenza A  Strep pharyngitis  Acute cystitis without hematuria    Rx / DC Orders ED Discharge Orders          Ordered    azithromycin (ZITHROMAX) 250 MG tablet  Daily        05/17/23 1920    sulfamethoxazole-trimethoprim (BACTRIM DS) 800-160 MG tablet  2 times daily        05/17/23 1920              Victoriano Lain 05/17/23 2326    Rondel Baton, MD 05/21/23 [phone]

## 2023-05-17 NOTE — ED Triage Notes (Signed)
Pt arrived via POV c/o cough and SOB that started this Monday. Pt reports trying OTC medications w/o relief.

## 2023-05-18 MED ORDER — ALBUTEROL SULFATE HFA 108 (90 BASE) MCG/ACT IN AERS
4.0000 | INHALATION_SPRAY | Freq: Once | RESPIRATORY_TRACT | Status: AC
  Administered 2023-05-18: 4 via RESPIRATORY_TRACT
  Filled 2023-05-18: qty 6.7

## 2023-05-18 MED ORDER — BENZONATATE 100 MG PO CAPS: 100.0000 mg | ORAL_CAPSULE | Freq: Three times a day (TID) | ORAL | 0 refills | Status: DC | PRN

## 2023-05-18 MED ORDER — AEROCHAMBER Z-STAT PLUS/MEDIUM MISC
1.0000 | Freq: Once | Status: AC
  Administered 2023-05-18: 1
  Filled 2023-05-18: qty 1

## 2023-05-18 MED ORDER — HYDROCODONE BIT-HOMATROP MBR 5-1.5 MG/5ML PO SOLN
5.0000 mL | Freq: Once | ORAL | Status: AC
  Administered 2023-05-18: 5 mL via ORAL
  Filled 2023-05-18: qty 5

## 2023-05-18 NOTE — ED Provider Notes (Signed)
12:45 AM Assumed care from Dr. Jarold Motto and PA Juli, please see their note for full history, physical and decision making until this point. In brief this is a 48 y.o. year old female who presented to the ED tonight with Cough     Here with cough, sore throat, feeling unwell overall. Found to have UTI, strep and influenza. Working on symptom control currently.   Reeval and patient lungs sound pretty good. Vitals are reassuring. Still with cough and achiness. Discussed that it might not go away immediately but will try some hycodan to see if that will help.  Helped some. Observed patient for multiple hours without change in vital signs. Still feels unwell but no indication for admission. D/c on symptomatic care and abx regimen decided by previous team. Pcp follow up 3 days for improvement, otherwise here if worsening.   Discharge instructions, including strict return precautions for new or worsening symptoms, given. Patient and/or family verbalized understanding and agreement with the plan as described.   Labs, studies and imaging reviewed by myself and considered in medical decision making if ordered. Imaging interpreted by radiology.  Labs Reviewed  RESP PANEL BY RT-PCR (RSV, FLU A&B, COVID)  RVPGX2 - Abnormal; Notable for the following components:      Result Value   Influenza A by PCR POSITIVE (*)    All other components within normal limits  GROUP A STREP BY PCR - Abnormal; Notable for the following components:   Group A Strep by PCR DETECTED (*)    All other components within normal limits  URINALYSIS, ROUTINE W REFLEX MICROSCOPIC - Abnormal; Notable for the following components:   Color, Urine AMBER (*)    APPearance HAZY (*)    Protein, ur 30 (*)    Nitrite POSITIVE (*)    Bacteria, UA RARE (*)    All other components within normal limits  CBC WITH DIFFERENTIAL/PLATELET - Abnormal; Notable for the following components:   WBC 2.7 (*)    Neutro Abs 0.8 (*)    All other components  within normal limits  BASIC METABOLIC PANEL - Abnormal; Notable for the following components:   Creatinine, Ser 1.18 (*)    Calcium 8.3 (*)    GFR, Estimated 57 (*)    All other components within normal limits  PREGNANCY, URINE  TROPONIN I (HIGH SENSITIVITY)    CT Angio Chest PE W and/or Wo Contrast  Final Result    DG Chest 2 View  Final Result      No follow-ups on file.    Karleigh Bunte, Barbara Cower, MD 05/18/23 [phone]

## 2023-05-18 NOTE — ED Notes (Signed)
Called pt's ride, Jari Favre and he states he is on the way; pt informed

## 2023-11-02 DIAGNOSIS — A419 Sepsis, unspecified organism: Secondary | ICD-10-CM

## 2023-11-02 HISTORY — DX: Sepsis, unspecified organism: A41.9

## 2023-11-05 ENCOUNTER — Other Ambulatory Visit (HOSPITAL_COMMUNITY): Payer: Self-pay | Admitting: Physician Assistant

## 2023-11-05 DIAGNOSIS — R42 Dizziness and giddiness: Secondary | ICD-10-CM

## 2023-11-07 ENCOUNTER — Emergency Department (HOSPITAL_COMMUNITY): Payer: MEDICAID

## 2023-11-07 ENCOUNTER — Encounter (HOSPITAL_COMMUNITY)
Admission: EM | Disposition: A | Discharge status: Home / Self Care | Payer: Self-pay | Source: Home / Self Care | Attending: Internal Medicine

## 2023-11-07 ENCOUNTER — Emergency Department (HOSPITAL_COMMUNITY): Payer: MEDICAID | Admitting: Certified Registered Nurse Anesthetist

## 2023-11-07 ENCOUNTER — Inpatient Hospital Stay (HOSPITAL_COMMUNITY)
Admission: EM | Admit: 2023-11-07 | Discharge: 2023-11-14 | DRG: 853 | Disposition: A | Discharge status: Home / Self Care | Payer: MEDICAID | Attending: Internal Medicine | Admitting: Internal Medicine

## 2023-11-07 ENCOUNTER — Inpatient Hospital Stay: Admit: 2023-11-07 | Payer: MEDICAID | Admitting: Urology

## 2023-11-07 ENCOUNTER — Other Ambulatory Visit: Payer: Self-pay

## 2023-11-07 ENCOUNTER — Encounter (HOSPITAL_COMMUNITY): Payer: Self-pay | Admitting: Anesthesiology

## 2023-11-07 ENCOUNTER — Inpatient Hospital Stay (HOSPITAL_COMMUNITY): Payer: MEDICAID

## 2023-11-07 DIAGNOSIS — N201 Calculus of ureter: Secondary | ICD-10-CM | POA: Diagnosis not present

## 2023-11-07 DIAGNOSIS — M549 Dorsalgia, unspecified: Secondary | ICD-10-CM | POA: Diagnosis present

## 2023-11-07 DIAGNOSIS — Z6841 Body Mass Index (BMI) 40.0 and over, adult: Secondary | ICD-10-CM | POA: Diagnosis not present

## 2023-11-07 DIAGNOSIS — F32A Depression, unspecified: Secondary | ICD-10-CM | POA: Diagnosis present

## 2023-11-07 DIAGNOSIS — G4700 Insomnia, unspecified: Secondary | ICD-10-CM | POA: Diagnosis present

## 2023-11-07 DIAGNOSIS — Z885 Allergy status to narcotic agent status: Secondary | ICD-10-CM

## 2023-11-07 DIAGNOSIS — N390 Urinary tract infection, site not specified: Secondary | ICD-10-CM | POA: Diagnosis present

## 2023-11-07 DIAGNOSIS — N170 Acute kidney failure with tubular necrosis: Secondary | ICD-10-CM | POA: Diagnosis present

## 2023-11-07 DIAGNOSIS — I10 Essential (primary) hypertension: Secondary | ICD-10-CM | POA: Diagnosis present

## 2023-11-07 DIAGNOSIS — N179 Acute kidney failure, unspecified: Secondary | ICD-10-CM | POA: Diagnosis present

## 2023-11-07 DIAGNOSIS — A4151 Sepsis due to Escherichia coli [E. coli]: Principal | ICD-10-CM | POA: Diagnosis present

## 2023-11-07 DIAGNOSIS — Z86711 Personal history of pulmonary embolism: Secondary | ICD-10-CM

## 2023-11-07 DIAGNOSIS — F419 Anxiety disorder, unspecified: Secondary | ICD-10-CM | POA: Diagnosis present

## 2023-11-07 DIAGNOSIS — N200 Calculus of kidney: Secondary | ICD-10-CM | POA: Diagnosis not present

## 2023-11-07 DIAGNOSIS — Z880 Allergy status to penicillin: Secondary | ICD-10-CM

## 2023-11-07 DIAGNOSIS — Z9049 Acquired absence of other specified parts of digestive tract: Secondary | ICD-10-CM

## 2023-11-07 DIAGNOSIS — N209 Urinary calculus, unspecified: Secondary | ICD-10-CM | POA: Diagnosis present

## 2023-11-07 DIAGNOSIS — R110 Nausea: Secondary | ICD-10-CM

## 2023-11-07 DIAGNOSIS — K219 Gastro-esophageal reflux disease without esophagitis: Secondary | ICD-10-CM | POA: Diagnosis present

## 2023-11-07 DIAGNOSIS — G894 Chronic pain syndrome: Secondary | ICD-10-CM | POA: Diagnosis present

## 2023-11-07 DIAGNOSIS — Z79899 Other long term (current) drug therapy: Secondary | ICD-10-CM

## 2023-11-07 DIAGNOSIS — N202 Calculus of kidney with calculus of ureter: Secondary | ICD-10-CM | POA: Diagnosis present

## 2023-11-07 DIAGNOSIS — R52 Pain, unspecified: Secondary | ICD-10-CM

## 2023-11-07 DIAGNOSIS — R079 Chest pain, unspecified: Secondary | ICD-10-CM

## 2023-11-07 DIAGNOSIS — E785 Hyperlipidemia, unspecified: Secondary | ICD-10-CM | POA: Diagnosis present

## 2023-11-07 HISTORY — PX: CYSTOSCOPY WITH STENT PLACEMENT: SHX5790

## 2023-11-07 LAB — URINALYSIS, ROUTINE W REFLEX MICROSCOPIC
Bilirubin Urine: NEGATIVE
Glucose, UA: NEGATIVE mg/dL
Hgb urine dipstick: NEGATIVE
Ketones, ur: NEGATIVE mg/dL
Nitrite: POSITIVE — AB
Protein, ur: NEGATIVE mg/dL
Specific Gravity, Urine: 1.015 (ref 1.005–1.030)
pH: 6 (ref 5.0–8.0)

## 2023-11-07 LAB — CBC
HCT: 37.4 % (ref 36.0–46.0)
Hemoglobin: 12.1 g/dL (ref 12.0–15.0)
MCH: 26.9 pg (ref 26.0–34.0)
MCHC: 32.4 g/dL (ref 30.0–36.0)
MCV: 83.3 fL (ref 80.0–100.0)
Platelets: 301 K/uL (ref 150–400)
RBC: 4.49 MIL/uL (ref 3.87–5.11)
RDW: 14.1 % (ref 11.5–15.5)
WBC: 9.1 K/uL (ref 4.0–10.5)
nRBC: 0 % (ref 0.0–0.2)

## 2023-11-07 LAB — BASIC METABOLIC PANEL WITH GFR
Anion gap: 12 (ref 5–15)
BUN: 19 mg/dL (ref 6–20)
CO2: 24 mmol/L (ref 22–32)
Calcium: 9.4 mg/dL (ref 8.9–10.3)
Chloride: 103 mmol/L (ref 98–111)
Creatinine, Ser: 1.29 mg/dL — ABNORMAL HIGH (ref 0.44–1.00)
GFR, Estimated: 52 mL/min — ABNORMAL LOW (ref >60)
Glucose, Bld: 100 mg/dL — ABNORMAL HIGH (ref 70–99)
Potassium: 4 mmol/L (ref 3.5–5.1)
Sodium: 139 mmol/L (ref 135–145)

## 2023-11-07 LAB — POC URINE PREG, ED: Preg Test, Ur: NEGATIVE

## 2023-11-07 LAB — TROPONIN I (HIGH SENSITIVITY)
Troponin I (High Sensitivity): 4 ng/L (ref <18)
Troponin I (High Sensitivity): 3 ng/L (ref <18)

## 2023-11-07 SURGERY — CYSTOSCOPY, WITH STENT INSERTION
Anesthesia: General | Site: Ureter | Laterality: Left

## 2023-11-07 MED ORDER — CHLORHEXIDINE GLUCONATE 0.12 % MT SOLN
15.0000 mL | Freq: Once | OROMUCOSAL | Status: AC
  Administered 2023-11-07: 15 mL via OROMUCOSAL

## 2023-11-07 MED ORDER — HYDROMORPHONE HCL 1 MG/ML IJ SOLN
0.5000 mg | Freq: Once | INTRAMUSCULAR | Status: AC
  Administered 2023-11-07: 0.5 mg via INTRAVENOUS
  Filled 2023-11-07: qty 0.5

## 2023-11-07 MED ORDER — MIDAZOLAM HCL 2 MG/2ML IJ SOLN
INTRAMUSCULAR | Status: AC
  Filled 2023-11-07: qty 2

## 2023-11-07 MED ORDER — FENTANYL CITRATE (PF) 100 MCG/2ML IJ SOLN
INTRAMUSCULAR | Status: AC
  Filled 2023-11-07: qty 2

## 2023-11-07 MED ORDER — DEXAMETHASONE SODIUM PHOSPHATE 10 MG/ML IJ SOLN
INTRAMUSCULAR | Status: AC
  Filled 2023-11-07: qty 1

## 2023-11-07 MED ORDER — ROCURONIUM BROMIDE 100 MG/10ML IV SOLN
INTRAVENOUS | Status: DC | PRN
  Administered 2023-11-07 (×2): 10 mg via INTRAVENOUS

## 2023-11-07 MED ORDER — PREDNISONE 20 MG PO TABS: 40.0000 mg | ORAL_TABLET | Freq: Every day | ORAL | Status: DC

## 2023-11-07 MED ORDER — ALBUTEROL SULFATE (2.5 MG/3ML) 0.083% IN NEBU: 2.5000 mg | INHALATION_SOLUTION | RESPIRATORY_TRACT | Status: DC | PRN

## 2023-11-07 MED ORDER — LIDOCAINE HCL URETHRAL/MUCOSAL 2 % EX GEL
CUTANEOUS | Status: AC
  Filled 2023-11-07: qty 5

## 2023-11-07 MED ORDER — ALBUMIN HUMAN 5 % IV SOLN: INTRAVENOUS | Status: DC | PRN

## 2023-11-07 MED ORDER — LIDOCAINE HCL URETHRAL/MUCOSAL 2 % EX GEL
CUTANEOUS | Status: AC
  Filled 2023-11-07: qty 30

## 2023-11-07 MED ORDER — ORAL CARE MOUTH RINSE: 15.0000 mL | Freq: Once | OROMUCOSAL | Status: AC

## 2023-11-07 MED ORDER — HYDROMORPHONE HCL 1 MG/ML IJ SOLN
1.0000 mg | Freq: Once | INTRAMUSCULAR | Status: AC
  Administered 2023-11-07: 1 mg via INTRAVENOUS
  Filled 2023-11-07: qty 1

## 2023-11-07 MED ORDER — LACTATED RINGERS IV BOLUS (SEPSIS)
1000.0000 mL | Freq: Once | INTRAVENOUS | Status: AC
  Administered 2023-11-07: 1000 mL via INTRAVENOUS

## 2023-11-07 MED ORDER — PHENYLEPHRINE 80 MCG/ML (10ML) SYRINGE FOR IV PUSH (FOR BLOOD PRESSURE SUPPORT)
PREFILLED_SYRINGE | INTRAVENOUS | Status: DC | PRN
  Administered 2023-11-07: 80 ug via INTRAVENOUS
  Administered 2023-11-07: 160 ug via INTRAVENOUS
  Administered 2023-11-07: 80 ug via INTRAVENOUS
  Administered 2023-11-07: 160 ug via INTRAVENOUS

## 2023-11-07 MED ORDER — OXYCODONE HCL 5 MG PO TABS: 5.0000 mg | ORAL_TABLET | Freq: Once | ORAL | Status: DC | PRN

## 2023-11-07 MED ORDER — SPIRONOLACTONE 25 MG PO TABS
50.0000 mg | ORAL_TABLET | Freq: Every day | ORAL | Status: DC
  Administered 2023-11-07: 50 mg via ORAL
  Filled 2023-11-07: qty 2

## 2023-11-07 MED ORDER — ACETAMINOPHEN 500 MG PO TABS
1000.0000 mg | ORAL_TABLET | Freq: Once | ORAL | Status: DC
Start: 2023-11-07 — End: 2023-11-07

## 2023-11-07 MED ORDER — TOPIRAMATE 100 MG PO TABS: 100.0000 mg | ORAL_TABLET | Freq: Two times a day (BID) | ORAL | Status: DC

## 2023-11-07 MED ORDER — ONDANSETRON 4 MG PO TBDP: 4.0000 mg | ORAL_TABLET | Freq: Four times a day (QID) | ORAL | Status: DC | PRN

## 2023-11-07 MED ORDER — LOSARTAN POTASSIUM 50 MG PO TABS
100.0000 mg | ORAL_TABLET | Freq: Every day | ORAL | Status: DC
  Administered 2023-11-07: 100 mg via ORAL
  Filled 2023-11-07: qty 2

## 2023-11-07 MED ORDER — ATORVASTATIN CALCIUM 40 MG PO TABS
40.0000 mg | ORAL_TABLET | Freq: Every evening | ORAL | Status: DC
  Administered 2023-11-07 – 2023-11-13 (×9): 40 mg via ORAL
  Filled 2023-11-07 (×7): qty 1

## 2023-11-07 MED ORDER — MIDAZOLAM HCL 5 MG/5ML IJ SOLN
INTRAMUSCULAR | Status: DC | PRN
  Administered 2023-11-07: 1 mg via INTRAVENOUS

## 2023-11-07 MED ORDER — KETOROLAC TROMETHAMINE 15 MG/ML IJ SOLN
15.0000 mg | Freq: Once | INTRAMUSCULAR | Status: AC
  Administered 2023-11-07: 15 mg via INTRAVENOUS
  Filled 2023-11-07: qty 1

## 2023-11-07 MED ORDER — SODIUM CHLORIDE 0.9 % IR SOLN
Status: DC | PRN
  Administered 2023-11-07: 1000 mL

## 2023-11-07 MED ORDER — NAPROXEN 250 MG PO TABS: 500.0000 mg | ORAL_TABLET | Freq: Two times a day (BID) | ORAL | Status: DC

## 2023-11-07 MED ORDER — CELECOXIB 200 MG PO CAPS: 200.0000 mg | ORAL_CAPSULE | Freq: Once | ORAL | Status: DC

## 2023-11-07 MED ORDER — ONDANSETRON HCL 4 MG/2ML IJ SOLN
INTRAMUSCULAR | Status: DC | PRN
  Administered 2023-11-07: 4 mg via INTRAVENOUS

## 2023-11-07 MED ORDER — SUGAMMADEX SODIUM 200 MG/2ML IV SOLN
INTRAVENOUS | Status: AC
  Filled 2023-11-07: qty 4

## 2023-11-07 MED ORDER — ROCURONIUM BROMIDE 10 MG/ML (PF) SYRINGE
PREFILLED_SYRINGE | INTRAVENOUS | Status: AC
  Filled 2023-11-07: qty 10

## 2023-11-07 MED ORDER — SERTRALINE HCL 25 MG PO TABS: 25.0000 mg | ORAL_TABLET | Freq: Every day | ORAL | Status: DC

## 2023-11-07 MED ORDER — ONDANSETRON HCL 4 MG/2ML IJ SOLN: 4.0000 mg | Freq: Once | INTRAMUSCULAR | Status: DC | PRN

## 2023-11-07 MED ORDER — GABAPENTIN 300 MG PO CAPS
300.0000 mg | ORAL_CAPSULE | Freq: Every day | ORAL | Status: DC
  Administered 2023-11-07 – 2023-11-14 (×11): 300 mg via ORAL
  Filled 2023-11-07 (×8): qty 1

## 2023-11-07 MED ORDER — ENOXAPARIN SODIUM 40 MG/0.4ML IJ SOSY
40.0000 mg | PREFILLED_SYRINGE | INTRAMUSCULAR | Status: DC
  Administered 2023-11-08 – 2023-11-14 (×10): 40 mg via SUBCUTANEOUS
  Filled 2023-11-07 (×7): qty 0.4

## 2023-11-07 MED ORDER — LIDOCAINE HCL (PF) 2 % IJ SOLN
INTRAMUSCULAR | Status: AC
  Filled 2023-11-07: qty 5

## 2023-11-07 MED ORDER — IOHEXOL 350 MG/ML SOLN
100.0000 mL | Freq: Once | INTRAVENOUS | Status: AC | PRN
  Administered 2023-11-07: 100 mL via INTRAVENOUS

## 2023-11-07 MED ORDER — FENTANYL CITRATE (PF) 100 MCG/2ML IJ SOLN
INTRAMUSCULAR | Status: DC | PRN
  Administered 2023-11-07: 50 ug via INTRAVENOUS

## 2023-11-07 MED ORDER — STERILE WATER FOR IRRIGATION IR SOLN
Status: DC | PRN
  Administered 2023-11-07: 3000 mL

## 2023-11-07 MED ORDER — ALBUMIN HUMAN 5 % IV SOLN
INTRAVENOUS | Status: AC
  Filled 2023-11-07: qty 250

## 2023-11-07 MED ORDER — LACTATED RINGERS IV SOLN: INTRAVENOUS | Status: DC

## 2023-11-07 MED ORDER — LIDOCAINE HCL (CARDIAC) PF 100 MG/5ML IV SOSY
PREFILLED_SYRINGE | INTRAVENOUS | Status: DC | PRN
  Administered 2023-11-07: 40 mg via INTRAVENOUS

## 2023-11-07 MED ORDER — IOHEXOL 300 MG/ML  SOLN
INTRAMUSCULAR | Status: DC | PRN
  Administered 2023-11-07: 50 mL via URETHRAL

## 2023-11-07 MED ORDER — PHENTERMINE HCL 37.5 MG PO TABS: 37.5000 mg | ORAL_TABLET | Freq: Every day | ORAL | Status: DC

## 2023-11-07 MED ORDER — DEXAMETHASONE SODIUM PHOSPHATE 10 MG/ML IJ SOLN
INTRAMUSCULAR | Status: DC | PRN
  Administered 2023-11-07: 10 mg via INTRAVENOUS

## 2023-11-07 MED ORDER — ONDANSETRON HCL 4 MG/2ML IJ SOLN
4.0000 mg | Freq: Once | INTRAMUSCULAR | Status: AC
  Administered 2023-11-07: 4 mg via INTRAVENOUS
  Filled 2023-11-07: qty 2

## 2023-11-07 MED ORDER — PANTOPRAZOLE SODIUM 40 MG PO TBEC: 40.0000 mg | DELAYED_RELEASE_TABLET | Freq: Every day | ORAL | Status: DC

## 2023-11-07 MED ORDER — DICYCLOMINE HCL 20 MG PO TABS: 20.0000 mg | ORAL_TABLET | Freq: Three times a day (TID) | ORAL | Status: DC | PRN

## 2023-11-07 MED ORDER — PROPOFOL 10 MG/ML IV BOLUS
INTRAVENOUS | Status: DC | PRN
  Administered 2023-11-07: 200 mg via INTRAVENOUS

## 2023-11-07 MED ORDER — LORATADINE 10 MG PO TABS
10.0000 mg | ORAL_TABLET | Freq: Every evening | ORAL | Status: DC
  Administered 2023-11-07 – 2023-11-13 (×9): 10 mg via ORAL
  Filled 2023-11-07 (×7): qty 1

## 2023-11-07 MED ORDER — OXYCODONE-ACETAMINOPHEN 5-325 MG PO TABS
1.0000 | ORAL_TABLET | Freq: Four times a day (QID) | ORAL | Status: DC | PRN
  Administered 2023-11-07 – 2023-11-09 (×4): 1 via ORAL
  Filled 2023-11-07 (×4): qty 1

## 2023-11-07 MED ORDER — MEPERIDINE HCL 25 MG/ML IJ SOLN: 6.2500 mg | INTRAMUSCULAR | Status: DC | PRN

## 2023-11-07 MED ORDER — ACETAMINOPHEN 500 MG PO TABS
1000.0000 mg | ORAL_TABLET | Freq: Once | ORAL | Status: AC
  Administered 2023-11-07: 1000 mg via ORAL
  Filled 2023-11-07: qty 2

## 2023-11-07 MED ORDER — KETOROLAC TROMETHAMINE 30 MG/ML IJ SOLN
INTRAMUSCULAR | Status: AC
  Filled 2023-11-07: qty 1

## 2023-11-07 MED ORDER — FAMOTIDINE 20 MG PO TABS: 20.0000 mg | ORAL_TABLET | Freq: Every day | ORAL | Status: DC

## 2023-11-07 MED ORDER — NITROGLYCERIN 0.4 MG SL SUBL
0.4000 mg | SUBLINGUAL_TABLET | Freq: Once | SUBLINGUAL | Status: AC
  Administered 2023-11-07: 0.4 mg via SUBLINGUAL
  Filled 2023-11-07: qty 1

## 2023-11-07 MED ORDER — ASPIRIN 81 MG PO CHEW
324.0000 mg | CHEWABLE_TABLET | Freq: Once | ORAL | Status: AC
  Administered 2023-11-07: 324 mg via ORAL
  Filled 2023-11-07: qty 4

## 2023-11-07 MED ORDER — OXYCODONE HCL 5 MG/5ML PO SOLN: 5.0000 mg | Freq: Once | ORAL | Status: DC | PRN

## 2023-11-07 MED ORDER — SUGAMMADEX SODIUM 200 MG/2ML IV SOLN
INTRAVENOUS | Status: DC | PRN
  Administered 2023-11-07: 300 mg via INTRAVENOUS

## 2023-11-07 MED ORDER — SUGAMMADEX SODIUM 200 MG/2ML IV SOLN
INTRAVENOUS | Status: AC
  Filled 2023-11-07: qty 2

## 2023-11-07 MED ORDER — TIZANIDINE HCL 4 MG PO TABS
4.0000 mg | ORAL_TABLET | Freq: Four times a day (QID) | ORAL | Status: DC | PRN
  Administered 2023-11-08 – 2023-11-12 (×4): 4 mg via ORAL
  Filled 2023-11-07 (×3): qty 1

## 2023-11-07 MED ORDER — ONDANSETRON HCL 4 MG/2ML IJ SOLN
INTRAMUSCULAR | Status: AC
  Filled 2023-11-07: qty 2

## 2023-11-07 MED ORDER — LACTATED RINGERS IV BOLUS
1000.0000 mL | Freq: Once | INTRAVENOUS | Status: AC
  Administered 2023-11-07: 1000 mL via INTRAVENOUS

## 2023-11-07 MED ORDER — SODIUM CHLORIDE 0.9 % IV SOLN
1.0000 g | Freq: Three times a day (TID) | INTRAVENOUS | Status: DC
  Administered 2023-11-07 – 2023-11-09 (×6): 1 g via INTRAVENOUS
  Filled 2023-11-07 (×7): qty 20

## 2023-11-07 MED ORDER — FENTANYL CITRATE PF 50 MCG/ML IJ SOSY: 25.0000 ug | PREFILLED_SYRINGE | INTRAMUSCULAR | Status: DC | PRN

## 2023-11-07 MED ORDER — SUCCINYLCHOLINE CHLORIDE 200 MG/10ML IV SOSY
PREFILLED_SYRINGE | INTRAVENOUS | Status: DC | PRN
  Administered 2023-11-07: 140 mg via INTRAVENOUS

## 2023-11-07 SURGICAL SUPPLY — 11 items
BAG URO CATCHER STRL LF (MISCELLANEOUS) ×1 IMPLANT
CATH URETL OPEN END 6FR 70 (CATHETERS) ×1 IMPLANT
CLOTH BEACON ORANGE TIMEOUT ST (SAFETY) ×1 IMPLANT
GLOVE SURG LX STRL 7.5 STRW (GLOVE) ×1 IMPLANT
GOWN STRL REUS W/ TWL XL LVL3 (GOWN DISPOSABLE) ×2 IMPLANT
GUIDEWIRE STR DUAL SENSOR (WIRE) ×1 IMPLANT
MANIFOLD NEPTUNE II (INSTRUMENTS) ×1 IMPLANT
MAT HALF PREVALON HALF STRYKER (MISCELLANEOUS) IMPLANT
PACK CYSTO (CUSTOM PROCEDURE TRAY) ×1 IMPLANT
STENT URET 6FRX26 CONTOUR (STENTS) IMPLANT
TUBING CONNECTING 10 (TUBING) IMPLANT

## 2023-11-07 NOTE — Anesthesia Preprocedure Evaluation (Addendum)
 Anesthesia Evaluation  Patient identified by MRN, date of birth, ID band Patient awake    Reviewed: Allergy & Precautions, H&P , NPO status , Patient's Chart, lab work & pertinent test results  Airway Mallampati: I  TM Distance: >3 FB Neck ROM: Full    Dental  (+) Teeth Intact, Dental Advisory Given   Pulmonary neg pulmonary ROS   breath sounds clear to auscultation       Cardiovascular Exercise Tolerance: Good hypertension,  Rhythm:Regular Rate:Normal     Neuro/Psych   Anxiety     negative neurological ROS  negative psych ROS   GI/Hepatic Neg liver ROS,GERD  Medicated,,  Endo/Other    Class 4 obesity  Renal/GU Renal disease  negative genitourinary   Musculoskeletal   Abdominal   Peds  Hematology negative hematology ROS (+)   Anesthesia Other Findings   Reproductive/Obstetrics negative OB ROS                              Anesthesia Physical Anesthesia Plan  ASA: 3  Anesthesia Plan: General   Post-op Pain Management: Tylenol  PO (pre-op)* and Toradol  IV (intra-op)*   Induction: Intravenous, Rapid sequence and Cricoid pressure planned  PONV Risk Score and Plan: 3 and Ondansetron , Dexamethasone  and Treatment may vary due to age or medical condition  Airway Management Planned: Oral ETT  Additional Equipment: None  Intra-op Plan:   Post-operative Plan: Extubation in OR  Informed Consent: I have reviewed the patients History and Physical, chart, labs and discussed the procedure including the risks, benefits and alternatives for the proposed anesthesia with the patient or authorized representative who has indicated his/her understanding and acceptance.       Plan Discussed with: Anesthesiologist and CRNA  Anesthesia Plan Comments: ( )         Anesthesia Quick Evaluation

## 2023-11-07 NOTE — Transfer of Care (Signed)
 Immediate Anesthesia Transfer of Care Note  Patient: Tracie Green  Procedure(s) Performed: CYSTOSCOPY, WITH STENT INSERTION (Left: Ureter)  Patient Location: PACU  Anesthesia Type:General  Level of Consciousness: awake, alert , and oriented  Airway & Oxygen Therapy: Patient connected to face mask oxygen  Post-op Assessment: Report given to RN and Post -op Vital signs reviewed and stable  Post vital signs: Reviewed and stable  Last Vitals:  Vitals Value Taken Time  BP 134/82 11/07/23 17:34  Temp    Pulse 120   Resp 20 11/07/23 17:35  SpO2 100   Vitals shown include unfiled device data.  Last Pain:  Vitals:   11/07/23 1534  TempSrc: Oral  PainSc:          Complications: There were no known notable events for this encounter.

## 2023-11-07 NOTE — ED Provider Notes (Signed)
 [place] EMERGENCY DEPARTMENT AT [facility] Provider Note   CSN: [id number] Arrival date & time: 11/07/23  9381     Patient presents with: Chest Pain   Tracie Green is a 48 y.o. female.   48 year old female history of chronic back pain, hyperlipidemia, and hypertension who presents emergency department with chest pain.  Patient reports that at 4 AM this morning she woke up with left-sided chest pain.  Describes as pressure and sharp sensation.  Radiates down her left arm.  Severe.  Has had nausea and vomiting with it as well.  Not exertional or pleuritic.  No shortness of breath.  No known cardiac history       Prior to Admission medications   Medication Sig Start Date End Date Taking? Authorizing Provider  albuterol  (VENTOLIN  HFA) 108 (90 Base) MCG/ACT inhaler Inhale 2 puffs into the lungs every 4 (four) hours as needed. 02/16/23   Stuart Vernell Norris, PA-C  atorvastatin  (LIPITOR) 40 MG tablet Take 1 tablet (40 mg total) by mouth daily. 01/27/22   Remi Pippin, NP  benzonatate  (TESSALON ) 100 MG capsule Take 1 capsule (100 mg total) by mouth 3 (three) times daily as needed for cough. 05/18/23   Mesner, Selinda, MD  clotrimazole  (LOTRIMIN ) 1 % cream Apply 1 Application topically 2 (two) times daily. 02/20/22   Golda Lynwood PARAS, MD  dextromethorphan  15 MG/5ML syrup Take 10 mLs (30 mg total) by mouth 4 (four) times daily as needed for cough. 02/20/22   Golda Lynwood PARAS, MD  dicyclomine  (BENTYL ) 20 MG tablet Take 1 tablet (20 mg total) by mouth 3 (three) times daily as needed for up to 21 doses for spasms. 01/18/22   Cottie Donnice PARAS, MD  famotidine  (PEPCID ) 20 MG tablet Take 1 tablet (20 mg total) by mouth daily for 30 doses. 08/29/22 09/28/22  Suellen Cantor A, PA-C  fluticasone  (FLONASE ) 50 MCG/ACT nasal spray SPRAY 2 SPRAYS INTO EACH NOSTRIL EVERY DAY 05/23/22   Zarwolo, Gloria, FNP  levocetirizine (XYZAL ) 5 MG tablet Take 1 tablet (5 mg total) by mouth every evening. 06/02/22    Zarwolo, Gloria, FNP  losartan  (COZAAR ) 100 MG tablet Take 100 mg by mouth daily. 06/26/22   [provider]  naproxen  (NAPROSYN ) 500 MG tablet Take 1 tablet (500 mg total) by mouth 2 (two) times daily with a meal. 03/17/22   Zarwolo, Gloria, FNP  omeprazole  (PRILOSEC) 20 MG capsule TAKE 1 CAPSULE BY MOUTH EVERY DAY 10/23/22   Zarwolo, Gloria, FNP  ondansetron  (ZOFRAN -ODT) 4 MG disintegrating tablet Take 1 tablet (4 mg total) by mouth every 6 (six) hours as needed for nausea or vomiting. 08/29/22   Suellen Cantor A, PA-C  oxyCODONE -acetaminophen  (PERCOCET/ROXICET) 5-325 MG tablet Take 1 tablet by mouth every 6 (six) hours as needed for severe pain. 07/28/22   Zarwolo, Gloria, FNP  phentermine  (ADIPEX-P ) 37.5 MG tablet Take 37.5 mg by mouth daily. 05/29/22   [provider]  predniSONE  (DELTASONE ) 20 MG tablet Take 2 tablets (40 mg total) by mouth daily with breakfast. 02/16/23   Stuart Vernell Norris, PA-C  PRESCRIPTION MEDICATION darvocet    [provider]  PROBIOTIC PRODUCT PO Take 1 capsule by mouth daily.    [provider]  promethazine -dextromethorphan  (PROMETHAZINE -DM) 6.25-15 MG/5ML syrup Take 5 mLs by mouth 4 (four) times daily as needed. 02/16/23   Stuart Vernell Norris, PA-C  sertraline  (ZOLOFT ) 25 MG tablet TAKE 1 TABLET (25 MG TOTAL) BY MOUTH DAILY. 04/10/22   Zarwolo, Gloria, FNP  spironolactone  (ALDACTONE ) 50 MG tablet Take 50 mg by mouth daily. 06/26/22   [provider]  tiZANidine  (ZANAFLEX ) 4 MG tablet TAKE 1 TABLET BY MOUTH EVERY 6 HOURS AS NEEDED FOR MUSCLE SPASMS. 10/03/22   Zarwolo, Gloria, FNP  topiramate  (TOPAMAX ) 100 MG tablet Take 1 tablet (100 mg total) by mouth 2 (two) times daily. 01/26/22   Remi Pippin, NP  UNABLE TO FIND Med Name: Right knee brace ICD: W19.XXXA 06/09/22   Zarwolo, Gloria, FNP  UNABLE TO FIND Med Name:  metal hinges to support the knee W19.ELENOR F74.438 06/12/22   Zarwolo, Gloria, FNP    Allergies: Penicillins,  Morphine , and Morphine  and codeine     Review of Systems  Updated Vital Signs BP (!) 149/107   Pulse (!) 131   Temp 98.4 F (36.9 C) (Oral)   Resp (!) 24   Ht 5' 9 (1.753 m)   Wt (!) 172.4 kg   LMP 10/14/2023 (Exact Date)   SpO2 98%   BMI 56.12 kg/m   Physical Exam Vitals and nursing note reviewed.  Constitutional:      General: She is not in acute distress.    Appearance: She is well-developed.  HENT:     Head: Normocephalic and atraumatic.     Right Ear: External ear normal.     Left Ear: External ear normal.     Nose: Nose normal.  Eyes:     Extraocular Movements: Extraocular movements intact.     Conjunctiva/sclera: Conjunctivae normal.     Pupils: Pupils are equal, round, and reactive to light.  Cardiovascular:     Rate and Rhythm: Normal rate and regular rhythm.     Heart sounds: No murmur heard.    Comments: Radial pulses 2+ bilaterally Pulmonary:     Effort: Pulmonary effort is normal. No respiratory distress.     Breath sounds: Normal breath sounds.  Musculoskeletal:     Cervical back: Normal range of motion and neck supple.     Right lower leg: No edema.     Left lower leg: No edema.     Comments: Somewhat limited due to habitus  Skin:    General: Skin is warm and dry.  Neurological:     Mental Status: She is alert and oriented to person, place, and time. Mental status is at baseline.  Psychiatric:        Mood and Affect: Mood normal.     (all labs ordered are listed, but only abnormal results are displayed) Labs Reviewed  BASIC METABOLIC PANEL WITH GFR - Abnormal; Notable for the following components:      Result Value   Glucose, Bld 100 (*)    Creatinine, Ser 1.29 (*)    GFR, Estimated 52 (*)    All other components within normal limits  URINALYSIS, ROUTINE W REFLEX MICROSCOPIC - Abnormal; Notable for the following components:   APPearance HAZY (*)    Nitrite POSITIVE (*)    Leukocytes,Ua SMALL (*)    Bacteria, UA RARE (*)    All other  components within normal limits  URINE CULTURE  CBC  POC URINE PREG, ED  TROPONIN I (HIGH SENSITIVITY)  TROPONIN I (HIGH SENSITIVITY)    EKG: EKG Interpretation Date/Time:  Wednesday November 07 2023 06:55:05 EDT Ventricular Rate:  76 PR Interval:  135 QRS Duration:  94 QT Interval:  417 QTC Calculation: 469 R Axis:   31  Text Interpretation: Sinus rhythm Baseline wander in lead(s) V3 V5 V6 Confirmed by Yolande,  Lamar [phone]) on 11/07/2023 6:59:16 AM  Radiology: CT ANGIO CHEST/ABD/PEL FOR DISSECTION W &/OR WO CONTRAST Result Date: 11/07/2023 CLINICAL DATA:  Left chest pain radiating to left arm EXAM: CT ANGIOGRAPHY CHEST, ABDOMEN AND PELVIS TECHNIQUE: Non-contrast CT of the chest was initially obtained. Multidetector CT imaging through the chest, abdomen and pelvis was performed using the standard protocol during bolus administration of intravenous contrast. Multiplanar reconstructed images and MIPs were obtained and reviewed to evaluate the vascular anatomy. RADIATION DOSE REDUCTION: This exam was performed according to the departmental dose-optimization program which includes automated exposure control, adjustment of the mA and/or kV according to patient size and/or use of iterative reconstruction technique. CONTRAST:  OMNIPAQUE  IOHEXOL  350 MG/ML SOLN COMPARISON:  CT chest abdomen pelvis September 09, 2023 FINDINGS: CTA CHEST FINDINGS Cardiovascular: Satisfactory opacification of the pulmonary arteries to the segmental level. No evidence of pulmonary embolism. Normal heart size. No pericardial effusion. Mediastinum/Nodes: No enlarged mediastinal, hilar, or axillary lymph nodes. Thyroid gland, trachea, demonstrate no significant findings. Hiatal hernia and mildly the mildly patulous esophagus. Lungs/Pleura: Lungs are clear. No pleural effusion or pneumothorax. Musculoskeletal: The no chest wall abnormality. No acute or significant osseous findings. Review of the MIP images confirms the above  findings. CTA ABDOMEN AND PELVIS FINDINGS VASCULAR Aorta: Normal caliber aorta without aneurysm, dissection, vasculitis or significant stenosis. Celiac: Patent without evidence of aneurysm, dissection, vasculitis or significant stenosis. SMA: Patent without evidence of aneurysm, dissection, vasculitis or significant stenosis. Renals: Both renal arteries are patent without evidence of aneurysm, dissection, vasculitis, fibromuscular dysplasia or significant stenosis. IMA: Patent without evidence of aneurysm, dissection, vasculitis or significant stenosis. Inflow: Patent without evidence of aneurysm, dissection, vasculitis or significant stenosis. Veins: No obvious venous abnormality within the limitations of this arterial phase study. Review of the MIP images confirms the above findings. NON-VASCULAR Hepatobiliary: Hepatomegaly with hepatic steatosis. Liver measures 21 cm in craniocaudal dimension. No suspicious enhancing lesion. Status post cholecystectomy with dilation of the common bile duct (reservoir phenomenon. Pancreas: Unremarkable. No pancreatic ductal dilatation or surrounding inflammatory changes. Spleen: Normal in size without focal abnormality. The Adrenals/Urinary Tract: Adrenal glands are unremarkable. There is a partially obstructive nephrolithiasis in proximal left ureter about 4 cm distal to UPJ measuring 6.5 mm with mild fullness of the left renal pelvis and collecting system proximal to the stone and mild peri ureteral fat stranding (image Mar 28, 1976) likely due to infectious/inflammatory changes. This stone was previously located in left kidney lower calyx and is now moved to proximal ureter. Additional nonobstructive nephrolithiasis in left kidney upper pole measuring 3 mm. Subcentimeter simple renal cortical cyst in left kidney interpolar region. Which does not require imaging follow-up. Bladder is thick-walled thick wall and under distended. Stomach/Bowel: Small hiatal hernia. Fat containing small  stomach is within normal limits. Appendix appears normal. No evidence of bowel wall thickening, distention, or inflammatory changes. Lymphatic: No suspicious lymphadenopathy Reproductive: Heterogeneous lobulated uterus.  No adnexal mass. Other: Tiny fat containing umbilical hernia with a defect measuring 11 mm. No abdominopelvic ascites. Musculoskeletal: No acute or significant osseous findings. Degenerative changes of the spine. Review of the MIP images confirms the above findings. IMPRESSION: Proximal left ureter partially obstructive calculus with mild fullness of the left collecting system and peri ureteral fat stranding. Additional punctate nonobstructive nephrolithiasis in left kidney upper pole. No suspicious vascular lesion . Hepatomegaly and hepatic steatosis. Cholecystectomy with mild biliary dilatation (reservoir phenomenon). Electronically Signed   By: Megan  Zare M.D.   On: 11/07/2023 12:50  DG Chest 2 View Result Date: 11/07/2023 CLINICAL DATA:  Chest pain with nausea. EXAM: CHEST - 2 VIEW COMPARISON:  05/17/2023 FINDINGS: The lungs are clear without focal pneumonia, edema, pneumothorax or pleural effusion. The cardiopericardial silhouette is within normal limits for size. No acute bony abnormality. Telemetry leads overlie the chest. IMPRESSION: No active cardiopulmonary disease. Electronically Signed   By: Camellia Candle M.D.   On: 11/07/2023 07:14     Procedures   Medications Ordered in the ED  meropenem  (MERREM ) 1 g in sodium chloride  0.9 % 100 mL IVPB (has no administration in time range)  lactated ringers  bolus 1,000 mL (has no administration in time range)  ondansetron  (ZOFRAN ) injection 4 mg (4 mg Intravenous Given 11/07/23 0728)  aspirin  chewable tablet 324 mg (324 mg Oral Given 11/07/23 0824)  nitroGLYCERIN  (NITROSTAT ) SL tablet 0.4 mg (0.4 mg Sublingual Given 11/07/23 0827)  HYDROmorphone  (DILAUDID ) injection 0.5 mg (0.5 mg Intravenous Given 11/07/23 0827)  HYDROmorphone  (DILAUDID )  injection 1 mg (1 mg Intravenous Given 11/07/23 1006)  iohexol  (OMNIPAQUE ) 350 MG/ML injection 100 mL (100 mLs Intravenous Contrast Given 11/07/23 1050)  HYDROmorphone  (DILAUDID ) injection 1 mg (1 mg Intravenous Given 11/07/23 1136)  ondansetron  (ZOFRAN ) injection 4 mg (4 mg Intravenous Given 11/07/23 1136)  ketorolac  (TORADOL ) 15 MG/ML injection 15 mg (15 mg Intravenous Given 11/07/23 1314)    Clinical Course as of 11/07/23 1317  Wed Nov 07, 2023  1153 Dr Gaston from urology states that patient should be sent to [facility] ER and they will see her there.  [RP]    Clinical Course User Index [RP] Yolande Lamar BROCKS, MD                                 Medical Decision Making Amount and/or Complexity of Data Reviewed Labs: ordered. Radiology: ordered.  Risk OTC drugs. Prescription drug management.   48 year old female history of chronic back pain, hyperlipidemia, and hypertension who presents emergency department with chest pain.  Initial Ddx:  MI, PE, dissection, kidney stone  MDM/Course:  Patient reports to the emergency department with left-sided chest pain radiating to her arm.  Does have hypertension and hyperlipidemia.  No history of MI.  In the emergency department her cardiac workup was overall reassuring with EKG and serial troponins not reflective of acute MI.  She did say that the pain was moving down towards her flank and that she had had a kidney stone in the past.  Urine is showing nitrites with rare bacteria and white blood cells.  She had a CTA dissection protocol in case of dissection causing her chest pain and flank pain which was negative for dissection but did show that she has a 6.5 mm stone with some stranding around the kidney.  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  She is afebrile without a white count right now.  Discussed with urology who would like for her to go to [facility] for intervention.  Dr. Lenor  excepts to the emergency department if she is unable to go to short stay before the procedure.   This patient presents to the ED for concern of complaints listed in HPI, this involves an extensive number of treatment options, and is a complaint that carries with it a high risk of complications and morbidity. Disposition including potential need for admission considered.   Dispo: Transfer to [facility]  long  Records reviewed Outpatient Clinic Notes The following labs were independently interpreted: Chemistry and show no acute abnormality I independently reviewed the following imaging with scope of interpretation limited to determining acute life threatening conditions related to emergency care: CT Abdomen/Pelvis and agree with the radiologist interpretation with the following exceptions: none I personally reviewed and interpreted cardiac monitoring: normal sinus rhythm  I personally reviewed and interpreted the pt's EKG: see above for interpretation  I have reviewed the patients home medications and made adjustments as needed Consults: Urology  Portions of this note were generated with Dragon dictation software. Dictation errors may occur despite best attempts at proofreading.     Final diagnoses:  Kidney stone  Intractable pain  Intractable nausea  Chest pain, unspecified type    ED Discharge Orders     None          Yolande Lamar BROCKS, MD 11/07/23 [phone]

## 2023-11-07 NOTE — Consult Note (Addendum)
 Urology Consult  Referring physician: Dr Jakie Reason for referral: Ureteral stone  Chief Complaint: Ureteral stone and urinary tract infection  History of Present Illness: Patient presented to the [facility] with flank pain on the left side.  Pain was not settling down.  I asked for the patient to be transferred to the [facility] emergency room and she was sent to the preoperative holding area.  I will send a test in the meantime she has developed a low-grade temperature.  She woke up at 4 AM with left-sided chest pain but a cardiac cause has been ruled out.  She had nausea and vomiting.  Serum creatinine 1.29.  White blood count 9.1  rare bacteria and urine culture sent she did have nitrites in the urine.  She was found to have a 6.5 mm stone upper left ureter.  She has a past history of urinary tract infection and was started on meropenem  for ESBL in the past.  I reviewed the CT scan.  It was felt that she had a left sided  6.5 millimeter stone 4 cm distal to the left ureteropelvic junction.  There is mild fullness of the renal pelvis and mild stranding.  The stone had previously been in the lower left kidney.` She has a 3 mm nonobstructing stone upper pole left kidney.  Patient has had bilateral stents before.  She is having vague left-sided pain now.  No gross hematuria.  She now is afebrile.  She does not look toxic.  Past Medical History:  Diagnosis Date   Hypertension    PE (pulmonary embolism)    Past Surgical History:  Procedure Laterality Date   CESAREAN SECTION     CHOLECYSTECTOMY  1998   INCISION AND DRAINAGE      Medications: I have reviewed the patient's current medications. Allergies:  Allergies  Allergen Reactions   Penicillins Hives and Rash    Hives   Morphine  Nausea And Vomiting   Morphine  And Codeine  Nausea And Vomiting    Family History  Problem Relation Age of Onset   Breast cancer Mother 57   Ovarian cancer Mother    BRCA 1/2 Neg Hx     Social History:  reports that she has never smoked. She has never used smokeless tobacco. She reports that she does not drink alcohol and does not use drugs.  ROS: All systems are reviewed and negative except as noted. Rest negative Physical Exam:  Vital signs in last 24 hours: Temp:  [98.4 F (36.9 C)-100.3 F (37.9 C)] 100.3 F (37.9 C) (08/06 1321) Pulse Rate:  [65-131] 128 (08/06 1345) Resp:  [10-25] 24 (08/06 1345) BP: (116-183)/(75-120) 148/114 (08/06 1345) SpO2:  [95 %-100 %] 100 % (08/06 1345) Weight:  [172.4 kg] 172.4 kg (08/06 0636)  Cardiovascular: Skin warm; not flushed Respiratory: Breaths quiet; no shortness of breath Abdomen: No masses Neurological: Normal sensation to touch Musculoskeletal: Normal motor function arms and legs Lymphatics: No inguinal adenopathy Skin: No rashes Genitourinary:Nontoxic.  No CVA tenderness.  Moderately obese  Laboratory Data:  Results for orders placed or performed during the hospital encounter of 11/07/23 (from the past 72 hours)  Basic metabolic panel     Status: Abnormal   Collection Time: 11/07/23  7:12 AM  Result Value Ref Range   Sodium 139 135 - 145 mmol/L   Potassium 4.0 3.5 - 5.1 mmol/L   Chloride 103 98 - 111 mmol/L   CO2 24 22 - 32 mmol/L   Glucose, Bld 100 (  H) 70 - 99 mg/dL    Comment: Glucose reference range applies only to samples taken after fasting for at least 8 hours.   BUN 19 6 - 20 mg/dL   Creatinine, Ser 8.70 (H) 0.44 - 1.00 mg/dL   Calcium  9.4 8.9 - 10.3 mg/dL   GFR, Estimated 52 (L) >60 mL/min    Comment: (NOTE) Calculated using the CKD-EPI Creatinine Equation (2021)    Anion gap 12 5 - 15    Comment: Performed at [facility], [address] [place], [place] [address]  CBC     Status: None   Collection Time: 11/07/23  7:12 AM  Result Value Ref Range   WBC 9.1 4.0 - 10.5 K/uL   RBC 4.49 3.87 - 5.11 MIL/uL   Hemoglobin 12.1 12.0 - 15.0 g/dL   HCT 62.5 63.9 - 53.9 %   MCV 83.3 80.0 - 100.0 fL    MCH 26.9 26.0 - 34.0 pg   MCHC 32.4 30.0 - 36.0 g/dL   RDW 85.8 88.4 - 84.4 %   Platelets 301 150 - 400 K/uL   nRBC 0.0 0.0 - 0.2 %    Comment: Performed at [facility], [address] [place], [place] [address]  Troponin I (High Sensitivity)     Status: None   Collection Time: 11/07/23  7:12 AM  Result Value Ref Range   Troponin I (High Sensitivity) 4 <18 ng/L    Comment: (NOTE) Elevated high sensitivity troponin I (hsTnI) values and significant  changes across serial measurements may suggest ACS but many other  chronic and acute conditions are known to elevate hsTnI results.  Refer to the Links section for chest pain algorithms and additional  guidance. Performed at [facility], [address] [place], [place] [address]   Urinalysis, Routine w reflex microscopic -Urine, Clean Catch     Status: Abnormal   Collection Time: 11/07/23  8:30 AM  Result Value Ref Range   Color, Urine YELLOW YELLOW   APPearance HAZY (A) CLEAR   Specific Gravity, Urine 1.015 1.005 - 1.030   pH 6.0 5.0 - 8.0   Glucose, UA NEGATIVE NEGATIVE mg/dL   Hgb urine dipstick NEGATIVE NEGATIVE   Bilirubin Urine NEGATIVE NEGATIVE   Ketones, ur NEGATIVE NEGATIVE mg/dL   Protein, ur NEGATIVE NEGATIVE mg/dL   Nitrite POSITIVE (A) NEGATIVE   Leukocytes,Ua SMALL (A) NEGATIVE   RBC / HPF 0-5 0 - 5 RBC/hpf   WBC, UA 21-50 0 - 5 WBC/hpf   Bacteria, UA RARE (A) NONE SEEN   Squamous Epithelial / HPF 11-20 0 - 5 /HPF    Comment: Performed at [facility], [address] [place], [place] [address]  POC urine preg, ED     Status: None   Collection Time: 11/07/23  8:31 AM  Result Value Ref Range   Preg Test, Ur NEGATIVE NEGATIVE    Comment:        THE SENSITIVITY OF THIS METHODOLOGY IS >20 mIU/mL.   Troponin I (High Sensitivity)     Status: None   Collection Time: 11/07/23  8:41 AM  Result Value Ref Range   Troponin I (High Sensitivity) 3 <18 ng/L    Comment: (NOTE) Elevated high sensitivity troponin I (hsTnI)  values and significant  changes across serial measurements may suggest ACS but many other  chronic and acute conditions are known to elevate hsTnI results.  Refer to the Links section for chest pain algorithms and additional  guidance. Performed at [facility], [address] [place], [place] [address]    No results found for this or any previous visit (from the past 240 hours). Creatinine: Recent Labs    11/07/23 9287  CREATININE 1.29*    Xrays: See report/chart As dictated  Impression/Assessment:  Patient has a left ureteral stone with low-grade temperature.  Sepsis protocol has started.  She is already had IV antibiotics.  Medicine will admit.  I could admit for cystoscopy retrograde and stent.  Sequelae and complications discussed including percutaneous tube and ureteral injury needing surgery.  Plan:  Patient agreed with the plan  Jenilee Franey A Jazzalyn Loewenstein 11/07/2023, 3:27 PM

## 2023-11-07 NOTE — Consult Note (Signed)
 SABRA

## 2023-11-07 NOTE — ED Notes (Signed)
 Patient ambulated to restroom.

## 2023-11-07 NOTE — Sepsis Progress Note (Addendum)
 Elink will follow per sepsis protocol.  Follow up on sepsis this pt was transferred to [facility] and is in surgery Lab was unable to obtain blood cultures proir to the transfer.

## 2023-11-07 NOTE — ED Provider Notes (Addendum)
 Patient now has a temperature of 100.3 after Toradol  and is tachycardic into the 130s.  Code sepsis initiated.  Blood cultures and lactic acid ordered.  Patient already has antibiotics that are ordered. Urology updated. They feel that hospitalist will need to admit the patient after the procedure. [facility] ED staff updated.    Yolande Lamar BROCKS, MD 11/07/23 [phone]

## 2023-11-07 NOTE — ED Notes (Signed)
 Patient transported to CT

## 2023-11-07 NOTE — ED Provider Notes (Signed)
 Blood pressure (!) 148/114, pulse (!) 128, temperature 100.3 F (37.9 C), temperature source Oral, resp. rate (!) 24, height 5' 9 (1.753 m), weight (!) 172.4 kg, last menstrual period 10/14/2023, SpO2 100%.   In short, Tracie Green is a 48 y.o. female with a chief complaint of Chest Pain .  Refer to the original H&P for additional details.  03:17 PM  Patient arrived to the short stay area from [facility].  She passed through the emergency department but was taken directly to short stay for urologic intervention.  Became borderline febrile with tachycardia but no hypotension.  Will page hospitalist for admit after procedure.   Discussed patient's case with [facility], Dr. Candy to request admission. Patient and family (if present) updated with plan.   I reviewed all nursing notes, vitals, pertinent old records, EKGs, labs, imaging (as available).    Darra Fonda MATSU, MD 11/07/23 [phone]

## 2023-11-07 NOTE — H&P (Signed)
 History and Physical    Tracie Green FMW:[id number] DOB: [birth year] DOA: 11/07/2023  PCP: Tracie Emmy Gully, PA-C  Patient coming from: [facility], via emergency room.  Patient lives at home.  I have personally briefly reviewed patient's old medical records available.   Chief Complaint: left sided chest pain , flank pain   HPI: Tracie Green is a 48 y.o. female with medical history significant of nephrolithiasis, previous UTI with ESBL E. coli, hypertension, hyperlipidemia, depression, chronic pain syndrome on pain management, GERD who woke up today morning about 4 AM with severe left-sided chest pain, 10 out of 10, radiating to the left shoulder, ultimately settled to the left flank.  She had few episodes of vomiting associated with it.  Did not have any fever at home but she had temperature 100.3 at the ER.  She initially took some antacids without improvement so her daughter brought her to the emergency room.  Denies any change in urination.  Most recently treated for nephrolithiasis a year ago, ESBL E. coli.  Patient denies any recent changes in medications. ED Course: Patient arrived to [facility].  Temperature 100.3, heart rate 130, WBC count normal.  Creatinine 1.29.  Patient underwent CT angiogram of the chest abdomen pelvis, negative for dissection.  She has 6.5 mm UPJ stone.  Urine abnormal.  Patient was given multiple doses of pain medications, meropenem , 3 L of IV fluids and was [facility]rology procedure. On my exam at preop, patient had controlled symptoms.  She had remaining mild pain but denies any other complaints.  She is aware about cystoscopy procedures as she had this before.  Review of Systems: all systems are reviewed and pertinent positive as per HPI otherwise rest are negative.    Past Medical History:  Diagnosis Date   Hypertension    PE (pulmonary embolism)     Past Surgical History:  Procedure Laterality Date   CESAREAN SECTION      CHOLECYSTECTOMY  1998   INCISION AND DRAINAGE      Social history   reports that she has never smoked. She has never used smokeless tobacco. She reports that she does not drink alcohol and does not use drugs.  Allergies  Allergen Reactions   Tramadol  Itching    edema   Penicillins Hives and Rash    Hives   Morphine  Nausea And Vomiting   Morphine  And Codeine  Nausea And Vomiting    Family History  Problem Relation Age of Onset   Breast cancer Mother 10   Ovarian cancer Mother    BRCA 1/2 Neg Hx      Prior to Admission medications   Medication Sig Start Date End Date Taking? Authorizing Provider  albuterol  (VENTOLIN  HFA) 108 (90 Base) MCG/ACT inhaler Inhale 2 puffs into the lungs every 4 (four) hours as needed. 02/16/23   Stuart Vernell Norris, PA-C  atorvastatin  (LIPITOR) 40 MG tablet Take 1 tablet (40 mg total) by mouth daily. 01/27/22   Remi Pippin, NP  benzonatate  (TESSALON ) 100 MG capsule Take 1 capsule (100 mg total) by mouth 3 (three) times daily as needed for cough. 05/18/23   Mesner, Selinda, MD  clotrimazole  (LOTRIMIN ) 1 % cream Apply 1 Application topically 2 (two) times daily. 02/20/22   Golda Lynwood PARAS, MD  dextromethorphan  15 MG/5ML syrup Take 10 mLs (30 mg total) by mouth 4 (four) times daily as needed for cough. 02/20/22   Golda Lynwood PARAS, MD  dicyclomine  (BENTYL ) 20 MG tablet  Take 1 tablet (20 mg total) by mouth 3 (three) times daily as needed for up to 21 doses for spasms. 01/18/22   Cottie Donnice PARAS, MD  famotidine  (PEPCID ) 20 MG tablet Take 1 tablet (20 mg total) by mouth daily for 30 doses. 08/29/22 09/28/22  Suellen Cantor A, PA-C  fluticasone  (FLONASE ) 50 MCG/ACT nasal spray SPRAY 2 SPRAYS INTO EACH NOSTRIL EVERY DAY 05/23/22   Zarwolo, Gloria, FNP  levocetirizine (XYZAL ) 5 MG tablet Take 1 tablet (5 mg total) by mouth every evening. 06/02/22   Zarwolo, Gloria, FNP  losartan  (COZAAR ) 100 MG tablet Take 100 mg by mouth daily. 06/26/22   [provider]   naproxen  (NAPROSYN ) 500 MG tablet Take 1 tablet (500 mg total) by mouth 2 (two) times daily with a meal. 03/17/22   Zarwolo, Gloria, FNP  omeprazole  (PRILOSEC) 20 MG capsule TAKE 1 CAPSULE BY MOUTH EVERY DAY 10/23/22   Zarwolo, Gloria, FNP  ondansetron  (ZOFRAN -ODT) 4 MG disintegrating tablet Take 1 tablet (4 mg total) by mouth every 6 (six) hours as needed for nausea or vomiting. 08/29/22   Suellen Cantor A, PA-C  oxyCODONE -acetaminophen  (PERCOCET/ROXICET) 5-325 MG tablet Take 1 tablet by mouth every 6 (six) hours as needed for severe pain. 07/28/22   Zarwolo, Gloria, FNP  phentermine  (ADIPEX-P ) 37.5 MG tablet Take 37.5 mg by mouth daily. 05/29/22   [provider]  predniSONE  (DELTASONE ) 20 MG tablet Take 2 tablets (40 mg total) by mouth daily with breakfast. 02/16/23   Stuart Vernell Norris, PA-C  PRESCRIPTION MEDICATION darvocet    [provider]  PROBIOTIC PRODUCT PO Take 1 capsule by mouth daily.    [provider]  promethazine -dextromethorphan  (PROMETHAZINE -DM) 6.25-15 MG/5ML syrup Take 5 mLs by mouth 4 (four) times daily as needed. 02/16/23   Stuart Vernell Norris, PA-C  sertraline  (ZOLOFT ) 25 MG tablet TAKE 1 TABLET (25 MG TOTAL) BY MOUTH DAILY. 04/10/22   Zarwolo, Gloria, FNP  spironolactone  (ALDACTONE ) 50 MG tablet Take 50 mg by mouth daily. 06/26/22   [provider]  tiZANidine  (ZANAFLEX ) 4 MG tablet TAKE 1 TABLET BY MOUTH EVERY 6 HOURS AS NEEDED FOR MUSCLE SPASMS. 10/03/22   Zarwolo, Gloria, FNP  topiramate  (TOPAMAX ) 100 MG tablet Take 1 tablet (100 mg total) by mouth 2 (two) times daily. 01/26/22   Remi Pippin, NP  UNABLE TO FIND Med Name: Right knee brace ICD: W19.XXXA 06/09/22   Zarwolo, Gloria, FNP  UNABLE TO FIND Med Name:  metal hinges to support the knee W19.ELENOR F74.438 06/12/22   Zarwolo, Gloria, FNP    Physical Exam: Vitals:   11/07/23 1736 11/07/23 1745 11/07/23 1800 11/07/23 1815  BP: 134/82 121/75 (!) 110/94 121/88  Pulse:  (!) 121 (!)  121 (!) 120  Resp: 20 15 16 14   Temp: 99 F (37.2 C)     TempSrc:      SpO2: 100% 97% 95% 91%  Weight:      Height:        Constitutional: NAD, calm, comfortable.  Pleasant interaction.  Morbidly obese. Vitals:   11/07/23 1736 11/07/23 1745 11/07/23 1800 11/07/23 1815  BP: 134/82 121/75 (!) 110/94 121/88  Pulse:  (!) 121 (!) 121 (!) 120  Resp: 20 15 16 14   Temp: 99 F (37.2 C)     TempSrc:      SpO2: 100% 97% 95% 91%  Weight:      Height:       Eyes: PERRL, lids and conjunctivae normal ENMT: Mucous membranes are  moist. Posterior pharynx clear of any exudate or lesions.Normal dentition.  Neck: normal, supple, no masses, no thyromegaly Respiratory: clear to auscultation bilaterally, no wheezing, no crackles. Normal respiratory effort. No accessory muscle use.  Cardiovascular: Regular rate and rhythm, no murmurs / rubs / gallops. No extremity edema. 2+ pedal pulses. No carotid bruits.  Abdomen: no tenderness, no masses palpated. No hepatosplenomegaly. Bowel sounds positive.  Patient is obese and difficult to localize any painful areas on palpation.  However there are no obvious tenderness.  Abdomen soft.  No rigidity.  No guarding. Musculoskeletal: no clubbing / cyanosis. No joint deformity upper and lower extremities. Good ROM, no contractures. Normal muscle tone.  Skin: no rashes, lesions, ulcers. No induration Neurologic: CN 2-12 grossly intact. Sensation intact, DTR normal. Strength 5/5 in all 4.  Psychiatric: Normal judgment and insight. Alert and oriented x 3. Normal mood.     Labs on Admission: I have personally reviewed following labs and imaging studies  CBC: Recent Labs  Lab 11/07/23 0712  WBC 9.1  HGB 12.1  HCT 37.4  MCV 83.3  PLT 301   Basic Metabolic Panel: Recent Labs  Lab 11/07/23 0712  NA 139  K 4.0  CL 103  CO2 24  GLUCOSE 100*  BUN 19  CREATININE 1.29*  CALCIUM  9.4   GFR: Estimated Creatinine Clearance: 92.5 mL/min (A) (by C-G formula  based on SCr of 1.29 mg/dL (H)). Liver Function Tests: No results for input(s): AST, ALT, ALKPHOS, BILITOT, PROT, ALBUMIN  in the last 168 hours. No results for input(s): LIPASE, AMYLASE in the last 168 hours. No results for input(s): AMMONIA in the last 168 hours. Coagulation Profile: No results for input(s): INR, PROTIME in the last 168 hours. Cardiac Enzymes: No results for input(s): CKTOTAL, CKMB, CKMBINDEX, TROPONINI in the last 168 hours. BNP (last 3 results) No results for input(s): PROBNP in the last 8760 hours. HbA1C: No results for input(s): HGBA1C in the last 72 hours. CBG: No results for input(s): GLUCAP in the last 168 hours. Lipid Profile: No results for input(s): CHOL, HDL, LDLCALC, TRIG, CHOLHDL, LDLDIRECT in the last 72 hours. Thyroid Function Tests: No results for input(s): TSH, T4TOTAL, FREET4, T3FREE, THYROIDAB in the last 72 hours. Anemia Panel: No results for input(s): VITAMINB12, FOLATE, FERRITIN, TIBC, IRON, RETICCTPCT in the last 72 hours. Urine analysis:    Component Value Date/Time   COLORURINE YELLOW 11/07/2023 0830   APPEARANCEUR HAZY (A) 11/07/2023 0830   LABSPEC 1.015 11/07/2023 0830   PHURINE 6.0 11/07/2023 0830   GLUCOSEU NEGATIVE 11/07/2023 0830   HGBUR NEGATIVE 11/07/2023 0830   BILIRUBINUR NEGATIVE 11/07/2023 0830   BILIRUBINUR negative 07/28/2022 1654   KETONESUR NEGATIVE 11/07/2023 0830   PROTEINUR NEGATIVE 11/07/2023 0830   UROBILINOGEN 0.2 07/28/2022 1654   UROBILINOGEN 0.2 03/19/2013 1647   NITRITE POSITIVE (A) 11/07/2023 0830   LEUKOCYTESUR SMALL (A) 11/07/2023 0830    Radiological Exams on Admission: DG C-Arm 1-60 Min-No Report Result Date: 11/07/2023 Fluoroscopy was utilized by the requesting physician.  No radiographic interpretation.   CT ANGIO CHEST/ABD/PEL FOR DISSECTION W &/OR WO CONTRAST Result Date: 11/07/2023 CLINICAL DATA:  Left chest pain radiating to  left arm EXAM: CT ANGIOGRAPHY CHEST, ABDOMEN AND PELVIS TECHNIQUE: Non-contrast CT of the chest was initially obtained. Multidetector CT imaging through the chest, abdomen and pelvis was performed using the standard protocol during bolus administration of intravenous contrast. Multiplanar reconstructed images and MIPs were obtained and reviewed to evaluate the vascular anatomy. RADIATION DOSE REDUCTION: This exam  was performed according to the departmental dose-optimization program which includes automated exposure control, adjustment of the mA and/or kV according to patient size and/or use of iterative reconstruction technique. CONTRAST:  OMNIPAQUE  IOHEXOL  350 MG/ML SOLN COMPARISON:  CT chest abdomen pelvis September 09, 2023 FINDINGS: CTA CHEST FINDINGS Cardiovascular: Satisfactory opacification of the pulmonary arteries to the segmental level. No evidence of pulmonary embolism. Normal heart size. No pericardial effusion. Mediastinum/Nodes: No enlarged mediastinal, hilar, or axillary lymph nodes. Thyroid gland, trachea, demonstrate no significant findings. Hiatal hernia and mildly the mildly patulous esophagus. Lungs/Pleura: Lungs are clear. No pleural effusion or pneumothorax. Musculoskeletal: The no chest wall abnormality. No acute or significant osseous findings. Review of the MIP images confirms the above findings. CTA ABDOMEN AND PELVIS FINDINGS VASCULAR Aorta: Normal caliber aorta without aneurysm, dissection, vasculitis or significant stenosis. Celiac: Patent without evidence of aneurysm, dissection, vasculitis or significant stenosis. SMA: Patent without evidence of aneurysm, dissection, vasculitis or significant stenosis. Renals: Both renal arteries are patent without evidence of aneurysm, dissection, vasculitis, fibromuscular dysplasia or significant stenosis. IMA: Patent without evidence of aneurysm, dissection, vasculitis or significant stenosis. Inflow: Patent without evidence of aneurysm, dissection,  vasculitis or significant stenosis. Veins: No obvious venous abnormality within the limitations of this arterial phase study. Review of the MIP images confirms the above findings. NON-VASCULAR Hepatobiliary: Hepatomegaly with hepatic steatosis. Liver measures 21 cm in craniocaudal dimension. No suspicious enhancing lesion. Status post cholecystectomy with dilation of the common bile duct (reservoir phenomenon. Pancreas: Unremarkable. No pancreatic ductal dilatation or surrounding inflammatory changes. Spleen: Normal in size without focal abnormality. The Adrenals/Urinary Tract: Adrenal glands are unremarkable. There is a partially obstructive nephrolithiasis in proximal left ureter about 4 cm distal to UPJ measuring 6.5 mm with mild fullness of the left renal pelvis and collecting system proximal to the stone and mild peri ureteral fat stranding (image [id number]) likely due to infectious/inflammatory changes. This stone was previously located in left kidney lower calyx and is now moved to proximal ureter. Additional nonobstructive nephrolithiasis in left kidney upper pole measuring 3 mm. Subcentimeter simple renal cortical cyst in left kidney interpolar region. Which does not require imaging follow-up. Bladder is thick-walled thick wall and under distended. Stomach/Bowel: Small hiatal hernia. Fat containing small stomach is within normal limits. Appendix appears normal. No evidence of bowel wall thickening, distention, or inflammatory changes. Lymphatic: No suspicious lymphadenopathy Reproductive: Heterogeneous lobulated uterus.  No adnexal mass. Other: Tiny fat containing umbilical hernia with a defect measuring 11 mm. No abdominopelvic ascites. Musculoskeletal: No acute or significant osseous findings. Degenerative changes of the spine. Review of the MIP images confirms the above findings. IMPRESSION: Proximal left ureter partially obstructive calculus with mild fullness of the left collecting system and peri ureteral  fat stranding. Additional punctate nonobstructive nephrolithiasis in left kidney upper pole. No suspicious vascular lesion . Hepatomegaly and hepatic steatosis. Cholecystectomy with mild biliary dilatation (reservoir phenomenon). Electronically Signed   By: Megan  Zare M.D.   On: 11/07/2023 12:50   DG Chest 2 View Result Date: 11/07/2023 CLINICAL DATA:  Chest pain with nausea. EXAM: CHEST - 2 VIEW COMPARISON:  05/17/2023 FINDINGS: The lungs are clear without focal pneumonia, edema, pneumothorax or pleural effusion. The cardiopericardial silhouette is within normal limits for size. No acute bony abnormality. Telemetry leads overlie the chest. IMPRESSION: No active cardiopulmonary disease. Electronically Signed   By: Camellia Candle M.D.   On: 11/07/2023 07:14    EKG: Independently reviewed.  Sinus tachycardia.  No acute ST-T wave  changes.  Assessment/Plan Principal Problem:   Left nephrolithiasis Active Problems:   Essential hypertension   Recurrent UTI   GERD (gastroesophageal reflux disease)   Ureteral stone     1.  Left-sided obstructive nephrolithiasis with infected stone, acute UTI present on admission: Patient with low-grade fever, tachypnea and tachycardia.  UA was abnormal.  Urine cultures been sent.  Collect blood cultures.  Depending upon previous available cultures, will continue patient on meropenem  today pending final cultures. Patient was adequately resuscitated with 3 L isotonic fluid.  Continue maintenance IV fluids. Postop management as per surgery.  Patient has been taken to cystoscopy and ureteroscopy today.  Adequate pain medications.  2.  Essential hypertension: At risk of hypotension.  Hold antihypertensives today.  3.  GERD: On PPI.  Continue.  4.  Chronic pain syndrome: Patient is on long-term pain management with oxycodone  and Zanaflex .  This will be continued.  5.  Anxiety and insomnia: Uses Xanax  at home.  This will be continued.  6.  Morbid obesity with BMI 56.   Denies history of sleep apnea.  She will benefit with weight loss and outpatient follow-up.   DVT prophylaxis: Lovenox    Code Status: Full code Family Communication: None at the bedside Disposition Plan: Anticipate home when stable Consults called: Urology Admission status: Inpatient.  MedSurg bed.   Renato Applebaum MD [facility]

## 2023-11-07 NOTE — ED Notes (Signed)
 Lab notified This nurse unable to obtain Blood cultures at this time, due to blood clotting off and not filling bottles.

## 2023-11-07 NOTE — Op Note (Signed)
 Preoperative diagnosis: Left ureteral stone and urinary tract infection Postoperative diagnosis: Left ureteral stone urinary tract infection Surgery: Cystoscopy left retrograde ureterogram and left ureteral stent insertion Surgeon: Dr. Glendia Hydia Copelin  The patient has the above diagnoses and consented for procedure.  Preoperative antibiotics were given.  She is on a septic protocol.  Due to obesity special mattress needs to transfer the patient carefully to table.  She was in excellent position with legs safe place.  Fluoroscopy was utilized  I used a 21 [place] cystoscope.  She had mild cystitis cystica.  She may have a small ureterocele or an elevation of the stenotic left ureteral orifice on the left side.  Right ureter orifice was normal.  I could not negotiate a sensor wire initially.  I could see some pus at the opening.  I could not take a photograph for technical reasons  I used a 6 [place] open ureteral catheter.  I then loaded a sensor wire through it.  With the open-ended catheter mixed to the small stenotic opening a wire to go up under fluoroscopic guidance advancing the open-ended ureteral catheter to the mid ureter.  Gentle retrograde ureterogram with approximately 5 cc of contrast and she had mild dilation of the left renal calyces and pelvis.  Under fluoroscopic guidance Sensor wire was passed curling the upper pole calyx.  Open-ended ureteral cath was removed.  I used a 26 x 6 stent that went nice and curling in the renal pelvis and bladder.  X-rays were taken.  It went in nicely.  There was a volcano effect of moderate severity.  I did not insert a Foley catheter  Patient be followed as per protocol

## 2023-11-07 NOTE — ED Triage Notes (Signed)
 Pt c/o chest pain that radiates to left arm, endorses nausea

## 2023-11-07 NOTE — Anesthesia Procedure Notes (Addendum)
 Procedure Name: Intubation Date/Time: 11/07/2023 4:42 PM  Performed by: Christopher Comings, CRNAPre-anesthesia Checklist: Patient identified, Emergency Drugs available, Suction available and Patient being monitored Patient Re-evaluated:Patient Re-evaluated prior to induction Oxygen Delivery Method: Circle system utilized Preoxygenation: Pre-oxygenation with 100% oxygen Induction Type: IV induction Ventilation: Mask ventilation without difficulty Laryngoscope Size: Mac and 4 Grade View: Grade I Tube type: Oral Tube size: 7.0 mm Number of attempts: 1 Airway Equipment and Method: Stylet and Oral airway Placement Confirmation: ETT inserted through vocal cords under direct vision, positive ETCO2 and breath sounds checked- equal and bilateral Secured at: 22 cm Tube secured with: Tape Dental Injury: Teeth and Oropharynx as per pre-operative assessment

## 2023-11-07 NOTE — ED Notes (Signed)
 Patient transported to X-ray

## 2023-11-07 NOTE — ED Notes (Signed)
 [facility] here to transport patient.

## 2023-11-08 ENCOUNTER — Encounter (HOSPITAL_COMMUNITY): Payer: Self-pay | Admitting: Urology

## 2023-11-08 DIAGNOSIS — Z885 Allergy status to narcotic agent status: Secondary | ICD-10-CM | POA: Diagnosis not present

## 2023-11-08 DIAGNOSIS — F419 Anxiety disorder, unspecified: Secondary | ICD-10-CM | POA: Diagnosis present

## 2023-11-08 DIAGNOSIS — K219 Gastro-esophageal reflux disease without esophagitis: Secondary | ICD-10-CM | POA: Diagnosis present

## 2023-11-08 DIAGNOSIS — N200 Calculus of kidney: Secondary | ICD-10-CM | POA: Diagnosis present

## 2023-11-08 DIAGNOSIS — Z86711 Personal history of pulmonary embolism: Secondary | ICD-10-CM | POA: Diagnosis not present

## 2023-11-08 DIAGNOSIS — N390 Urinary tract infection, site not specified: Secondary | ICD-10-CM | POA: Diagnosis present

## 2023-11-08 DIAGNOSIS — M549 Dorsalgia, unspecified: Secondary | ICD-10-CM | POA: Diagnosis present

## 2023-11-08 DIAGNOSIS — I10 Essential (primary) hypertension: Secondary | ICD-10-CM | POA: Diagnosis present

## 2023-11-08 DIAGNOSIS — Z79899 Other long term (current) drug therapy: Secondary | ICD-10-CM | POA: Diagnosis not present

## 2023-11-08 DIAGNOSIS — E785 Hyperlipidemia, unspecified: Secondary | ICD-10-CM | POA: Diagnosis present

## 2023-11-08 DIAGNOSIS — G4700 Insomnia, unspecified: Secondary | ICD-10-CM | POA: Diagnosis present

## 2023-11-08 DIAGNOSIS — A4151 Sepsis due to Escherichia coli [E. coli]: Secondary | ICD-10-CM | POA: Diagnosis present

## 2023-11-08 DIAGNOSIS — G894 Chronic pain syndrome: Secondary | ICD-10-CM | POA: Diagnosis present

## 2023-11-08 DIAGNOSIS — F32A Depression, unspecified: Secondary | ICD-10-CM | POA: Diagnosis present

## 2023-11-08 DIAGNOSIS — N170 Acute kidney failure with tubular necrosis: Secondary | ICD-10-CM | POA: Diagnosis present

## 2023-11-08 DIAGNOSIS — Z6841 Body Mass Index (BMI) 40.0 and over, adult: Secondary | ICD-10-CM | POA: Diagnosis not present

## 2023-11-08 DIAGNOSIS — Z880 Allergy status to penicillin: Secondary | ICD-10-CM | POA: Diagnosis not present

## 2023-11-08 DIAGNOSIS — N202 Calculus of kidney with calculus of ureter: Secondary | ICD-10-CM | POA: Diagnosis present

## 2023-11-08 DIAGNOSIS — Z9049 Acquired absence of other specified parts of digestive tract: Secondary | ICD-10-CM | POA: Diagnosis not present

## 2023-11-08 LAB — CBC WITH DIFFERENTIAL/PLATELET
Abs Immature Granulocytes: 0.57 K/uL — ABNORMAL HIGH (ref 0.00–0.07)
Basophils Absolute: 0.1 K/uL (ref 0.0–0.1)
Basophils Relative: 0 %
Eosinophils Absolute: 0.1 K/uL (ref 0.0–0.5)
Eosinophils Relative: 0 %
HCT: 35 % — ABNORMAL LOW (ref 36.0–46.0)
Hemoglobin: 11 g/dL — ABNORMAL LOW (ref 12.0–15.0)
Immature Granulocytes: 2 %
Lymphocytes Relative: 3 %
Lymphs Abs: 0.8 K/uL (ref 0.7–4.0)
MCH: 26.6 pg (ref 26.0–34.0)
MCHC: 31.4 g/dL (ref 30.0–36.0)
MCV: 84.7 fL (ref 80.0–100.0)
Monocytes Absolute: 0.9 K/uL (ref 0.1–1.0)
Monocytes Relative: 3 %
Neutro Abs: 28.3 K/uL — ABNORMAL HIGH (ref 1.7–7.7)
Neutrophils Relative %: 92 %
Platelets: 173 K/uL (ref 150–400)
RBC: 4.13 MIL/uL (ref 3.87–5.11)
RDW: 14.6 % (ref 11.5–15.5)
Smear Review: NORMAL
WBC: 30.8 K/uL — ABNORMAL HIGH (ref 4.0–10.5)
nRBC: 0 % (ref 0.0–0.2)

## 2023-11-08 LAB — BASIC METABOLIC PANEL WITH GFR
Anion gap: 10 (ref 5–15)
BUN: 25 mg/dL — ABNORMAL HIGH (ref 6–20)
CO2: 24 mmol/L (ref 22–32)
Calcium: 8.9 mg/dL (ref 8.9–10.3)
Chloride: 105 mmol/L (ref 98–111)
Creatinine, Ser: 1.77 mg/dL — ABNORMAL HIGH (ref 0.44–1.00)
GFR, Estimated: 35 mL/min — ABNORMAL LOW (ref >60)
Glucose, Bld: 144 mg/dL — ABNORMAL HIGH (ref 70–99)
Potassium: 4.5 mmol/L (ref 3.5–5.1)
Sodium: 139 mmol/L (ref 135–145)

## 2023-11-08 LAB — HIV ANTIBODY (ROUTINE TESTING W REFLEX): HIV Screen 4th Generation wRfx: NONREACTIVE

## 2023-11-08 MED ORDER — UBROGEPANT 100 MG PO TABS
100.0000 mg | ORAL_TABLET | ORAL | Status: DC | PRN
  Filled 2023-11-08: qty 1

## 2023-11-08 MED ORDER — ALPRAZOLAM 0.5 MG PO TABS
0.5000 mg | ORAL_TABLET | Freq: Two times a day (BID) | ORAL | Status: DC
  Administered 2023-11-08 – 2023-11-14 (×17): 0.5 mg via ORAL
  Filled 2023-11-08 (×12): qty 1

## 2023-11-08 MED ORDER — OXYCODONE HCL 5 MG PO TABS
10.0000 mg | ORAL_TABLET | Freq: Four times a day (QID) | ORAL | Status: DC
  Administered 2023-11-08 – 2023-11-14 (×33): 10 mg via ORAL
  Filled 2023-11-08 (×24): qty 2

## 2023-11-08 MED ORDER — ASPIRIN-ACETAMINOPHEN-CAFFEINE 250-250-65 MG PO TABS
1.0000 | ORAL_TABLET | Freq: Every day | ORAL | Status: DC | PRN
  Administered 2023-11-08 – 2023-11-12 (×6): 1 via ORAL
  Filled 2023-11-08 (×6): qty 1

## 2023-11-08 MED ORDER — ALLOPURINOL 100 MG PO TABS
100.0000 mg | ORAL_TABLET | Freq: Two times a day (BID) | ORAL | Status: DC
  Administered 2023-11-08 – 2023-11-14 (×18): 100 mg via ORAL
  Filled 2023-11-08 (×13): qty 1

## 2023-11-08 NOTE — Progress Notes (Signed)
   11/08/23 0848  TOC Brief Assessment  Insurance and Status Reviewed  Patient has primary care physician Yes  Home environment has been reviewed Resides in single family home with non-relatives  Prior level of function: Independent with ADLs at baseline  Prior/Current Home Services No current home services  Social Drivers of Health Review SDOH reviewed no interventions necessary  Readmission risk has been reviewed Yes  Transition of care needs no transition of care needs at this time

## 2023-11-08 NOTE — Progress Notes (Signed)
 1 Day Post-Op Subjective: First time meeting Tracie Green.  She is still having intermittent flank pain, though she reports this to be much improved and is feeling better overall.  Resting in bed having breakfast on my arrival.  Reviewed case and plan.  Objective: Vital signs in last 24 hours: Temp:  [97.5 F (36.4 C)-100.3 F (37.9 C)] 98.7 F (37.1 C) (08/07 0644) Pulse Rate:  [73-131] 82 (08/07 0644) Resp:  [14-25] 18 (08/07 0644) BP: (103-156)/(62-120) 117/69 (08/07 0644) SpO2:  [91 %-100 %] 98 % (08/07 0644)  Assessment/Plan: 35Y female with sepsis 2/2 obstructing left ureteral stone.  # Left ureteral stone # Sepsis 2/2 urinary source # AKI  CT A/P notes 6.5 mm obstructing left ureteral stone  Interval development of severe leukocytosis overnight, 30.8 today with serum creatinine of 1.77.  Continue hydration and broad-spectrum ABX while awaiting speciation and sensitivities.  Expect these will both be self-limiting over the coming days.  Definitive stone management on an outpatient basis in the next 3 to 4 weeks, after patient is cleared her infection.  She has had a stent in the past and understands the process going forward  Urology will continue to follow  Intake/Output from previous day: 08/06 0701 - 08/07 0700 In: 1250 [I.V.:1000; IV Piggyback:250] Out: -   Intake/Output this shift: Total I/O In: -  Out: 300 [Urine:300]  Physical Exam:  General: Alert and oriented CV: No cyanosis Lungs: equal chest rise Abdomen: Soft, NTND, no rebound or guarding   Lab Results: Recent Labs    11/07/23 0712 11/08/23 0505  HGB 12.1 11.0*  HCT 37.4 35.0*   BMET Recent Labs    11/07/23 0712 11/08/23 0505  NA 139 139  K 4.0 4.5  CL 103 105  CO2 24 24  GLUCOSE 100* 144*  BUN 19 25*  CREATININE 1.29* 1.77*  CALCIUM  9.4 8.9  HGB 12.1 11.0*  WBC 9.1 30.8*     Studies/Results: DG C-Arm 1-60 Min-No Report Result Date: 11/07/2023 Fluoroscopy was utilized by the  requesting physician.  No radiographic interpretation.   CT ANGIO CHEST/ABD/PEL FOR DISSECTION W &/OR WO CONTRAST Result Date: 11/07/2023 CLINICAL DATA:  Left chest pain radiating to left arm EXAM: CT ANGIOGRAPHY CHEST, ABDOMEN AND PELVIS TECHNIQUE: Non-contrast CT of the chest was initially obtained. Multidetector CT imaging through the chest, abdomen and pelvis was performed using the standard protocol during bolus administration of intravenous contrast. Multiplanar reconstructed images and MIPs were obtained and reviewed to evaluate the vascular anatomy. RADIATION DOSE REDUCTION: This exam was performed according to the departmental dose-optimization program which includes automated exposure control, adjustment of the mA and/or kV according to patient size and/or use of iterative reconstruction technique. CONTRAST:  OMNIPAQUE  IOHEXOL  350 MG/ML SOLN COMPARISON:  CT chest abdomen pelvis September 09, 2023 FINDINGS: CTA CHEST FINDINGS Cardiovascular: Satisfactory opacification of the pulmonary arteries to the segmental level. No evidence of pulmonary embolism. Normal heart size. No pericardial effusion. Mediastinum/Nodes: No enlarged mediastinal, hilar, or axillary lymph nodes. Thyroid gland, trachea, demonstrate no significant findings. Hiatal hernia and mildly the mildly patulous esophagus. Lungs/Pleura: Lungs are clear. No pleural effusion or pneumothorax. Musculoskeletal: The no chest wall abnormality. No acute or significant osseous findings. Review of the MIP images confirms the above findings. CTA ABDOMEN AND PELVIS FINDINGS VASCULAR Aorta: Normal caliber aorta without aneurysm, dissection, vasculitis or significant stenosis. Celiac: Patent without evidence of aneurysm, dissection, vasculitis or significant stenosis. SMA: Patent without evidence of aneurysm, dissection, vasculitis or significant stenosis.  Renals: Both renal arteries are patent without evidence of aneurysm, dissection, vasculitis,  fibromuscular dysplasia or significant stenosis. IMA: Patent without evidence of aneurysm, dissection, vasculitis or significant stenosis. Inflow: Patent without evidence of aneurysm, dissection, vasculitis or significant stenosis. Veins: No obvious venous abnormality within the limitations of this arterial phase study. Review of the MIP images confirms the above findings. NON-VASCULAR Hepatobiliary: Hepatomegaly with hepatic steatosis. Liver measures 21 cm in craniocaudal dimension. No suspicious enhancing lesion. Status post cholecystectomy with dilation of the common bile duct (reservoir phenomenon. Pancreas: Unremarkable. No pancreatic ductal dilatation or surrounding inflammatory changes. Spleen: Normal in size without focal abnormality. The Adrenals/Urinary Tract: Adrenal glands are unremarkable. There is a partially obstructive nephrolithiasis in proximal left ureter about 4 cm distal to UPJ measuring 6.5 mm with mild fullness of the left renal pelvis and collecting system proximal to the stone and mild peri ureteral fat stranding (image 02-Jan-1976) likely due to infectious/inflammatory changes. This stone was previously located in left kidney lower calyx and is now moved to proximal ureter. Additional nonobstructive nephrolithiasis in left kidney upper pole measuring 3 mm. Subcentimeter simple renal cortical cyst in left kidney interpolar region. Which does not require imaging follow-up. Bladder is thick-walled thick wall and under distended. Stomach/Bowel: Small hiatal hernia. Fat containing small stomach is within normal limits. Appendix appears normal. No evidence of bowel wall thickening, distention, or inflammatory changes. Lymphatic: No suspicious lymphadenopathy Reproductive: Heterogeneous lobulated uterus.  No adnexal mass. Other: Tiny fat containing umbilical hernia with a defect measuring 11 mm. No abdominopelvic ascites. Musculoskeletal: No acute or significant osseous findings. Degenerative changes of  the spine. Review of the MIP images confirms the above findings. IMPRESSION: Proximal left ureter partially obstructive calculus with mild fullness of the left collecting system and peri ureteral fat stranding. Additional punctate nonobstructive nephrolithiasis in left kidney upper pole. No suspicious vascular lesion . Hepatomegaly and hepatic steatosis. Cholecystectomy with mild biliary dilatation (reservoir phenomenon). Electronically Signed   By: Megan  Zare M.D.   On: 11/07/2023 12:50   DG Chest 2 View Result Date: 11/07/2023 CLINICAL DATA:  Chest pain with nausea. EXAM: CHEST - 2 VIEW COMPARISON:  05/17/2023 FINDINGS: The lungs are clear without focal pneumonia, edema, pneumothorax or pleural effusion. The cardiopericardial silhouette is within normal limits for size. No acute bony abnormality. Telemetry leads overlie the chest. IMPRESSION: No active cardiopulmonary disease. Electronically Signed   By: Camellia Candle M.D.   On: 11/07/2023 07:14      LOS: 1 day   Ole Bourdon, NP [facility] Pager: [phone]  11/08/2023, 9:24 AM

## 2023-11-08 NOTE — Progress Notes (Signed)
 PROGRESS NOTE    Tracie Green  FMW:[id number] DOB: 10-Aug-1975 DOA: 11/07/2023 PCP: Tobie Emmy Gully, PA-C   Brief Narrative: 48 y.o. female with medical history significant of nephrolithiasis, previous UTI with ESBL E. coli, hypertension, hyperlipidemia, depression, chronic pain syndrome on pain management, GERD who woke up today morning about 4 AM with severe left-sided chest pain, 10 out of 10, radiating to the left shoulder, ultimately settled to the left flank.  She had few episodes of vomiting associated with it.  Did not have any fever at home but she had temperature 100.3 at the ER.  She initially took some antacids without improvement so her daughter brought her to the emergency room.  Denies any change in urination.  Most recently treated for nephrolithiasis a year ago, ESBL E. coli.  Patient denies any recent changes in medications. ED Course: Patient arrived to [facility].  Temperature 100.3, heart rate 130, WBC count normal.  Creatinine 1.29.  Patient underwent CT angiogram of the chest abdomen pelvis, negative for dissection.  She has 6.5 mm UPJ stone.  Urine abnormal.  Patient was given multiple doses of pain medications, meropenem , 3 L of IV fluids and [facility]rology procedure. On my exam at preop, patient had controlled symptoms.  She had remaining mild pain but denies any other complaints.  She is aware about cystoscopy procedures as she had this before.  Assessment & Plan:   Principal Problem:   Left nephrolithiasis Active Problems:   Essential hypertension   Recurrent UTI   GERD (gastroesophageal reflux disease)   Urolithiasis   Ureteral stone   1.  Sepsis secondary to left-sided obstructive nephrolithiasis with infected stone, acute UTI present on admission: Patient admitted with tachypnea tachycardia leukocytosis and low-grade fever.  UA was consistent with UTI.  Urine cultures pending.  Blood cultures pending.  Patient was started  on meropenem  continue.  Continue IV fluids.  Patient is status post cystoscopy and ureteroscopy with left ureteral stent insertion.   2.  Essential hypertension: Blood pressures low normal to soft continue to hold antihypertensives.    3.  GERD: On PPI.  Continue.   4.  Chronic pain syndrome: Continue oxycodone  Zanaflex  and as needed Dilaudid    5.  Anxiety and insomnia: Restart Xanax   6.  Morbid obesity with BMI 56.  Denies history of sleep apnea.  She will benefit with weight loss and outpatient follow-up.    Estimated body mass index is 56.12 kg/m as calculated from the following:   Height as of this encounter: 5' 9 (1.753 m).   Weight as of this encounter: 172.4 kg.  DVT prophylaxis: lovenox  Code Status: full Family Communication: none Disposition Plan:  Status is: Inpatient Remains inpatient appropriate because: acute illness   Consultants:  Urology  Procedures: Status post cystoscopy  Antimicrobials: Meropenem   Subjective: Resting in bed complaining of left-sided pain but better than yesterday continues to have some nausea but no vomiting  Objective: Vitals:   11/07/23 1845 11/07/23 2326 11/08/23 0315 11/08/23 0644  BP: 121/70 112/65 103/62 117/69  Pulse: (!) 118 99 83 82  Resp: 17 18 18 18   Temp: (!) 97.5 F (36.4 C) 98.8 F (37.1 C) 98.5 F (36.9 C) 98.7 F (37.1 C)  TempSrc: Oral Oral  Oral  SpO2: 98% 94% 95% 98%  Weight:      Height:        Intake/Output Summary (Last 24 hours) at 11/08/2023 1154 Last data filed at 11/08/2023  9197 Gross per 24 hour  Intake 1250 ml  Output 300 ml  Net 950 ml   Filed Weights   11/07/23 0636  Weight: (!) 172.4 kg    Examination:  General exam: Appears in mild distress due to abdominal pain  respiratory system: Clear to auscultation. Respiratory effort normal. Cardiovascular system: S1 & S2 heard, RRR. No JVD, murmurs, rubs, gallops or clicks. No pedal edema. Gastrointestinal system: Abdomen is nondistended, soft  left sided abdominal tenderness  Central nervous system: Alert and oriented. No focal neurological deficits. Extremities: Trace edema   Data Reviewed: I have personally reviewed following labs and imaging studies  CBC: Recent Labs  Lab 11/07/23 0712 11/08/23 0505  WBC 9.1 30.8*  NEUTROABS  --  28.3*  HGB 12.1 11.0*  HCT 37.4 35.0*  MCV 83.3 84.7  PLT 301 173   Basic Metabolic Panel: Recent Labs  Lab 11/07/23 0712 11/08/23 0505  NA 139 139  K 4.0 4.5  CL 103 105  CO2 24 24  GLUCOSE 100* 144*  BUN 19 25*  CREATININE 1.29* 1.77*  CALCIUM  9.4 8.9   GFR: Estimated Creatinine Clearance: 67.4 mL/min (A) (by C-G formula based on SCr of 1.77 mg/dL (H)). Liver Function Tests: No results for input(s): AST, ALT, ALKPHOS, BILITOT, PROT, ALBUMIN  in the last 168 hours. No results for input(s): LIPASE, AMYLASE in the last 168 hours. No results for input(s): AMMONIA in the last 168 hours. Coagulation Profile: No results for input(s): INR, PROTIME in the last 168 hours. Cardiac Enzymes: No results for input(s): CKTOTAL, CKMB, CKMBINDEX, TROPONINI in the last 168 hours. BNP (last 3 results) No results for input(s): PROBNP in the last 8760 hours. HbA1C: No results for input(s): HGBA1C in the last 72 hours. CBG: No results for input(s): GLUCAP in the last 168 hours. Lipid Profile: No results for input(s): CHOL, HDL, LDLCALC, TRIG, CHOLHDL, LDLDIRECT in the last 72 hours. Thyroid Function Tests: No results for input(s): TSH, T4TOTAL, FREET4, T3FREE, THYROIDAB in the last 72 hours. Anemia Panel: No results for input(s): VITAMINB12, FOLATE, FERRITIN, TIBC, IRON, RETICCTPCT in the last 72 hours. Sepsis Labs: No results for input(s): PROCALCITON, LATICACIDVEN in the last 168 hours.  Recent Results (from the past 240 hours)  Culture, blood (Routine X 2) w Reflex to ID Panel     Status: None (Preliminary result)    Collection Time: 11/07/23  6:22 PM   Specimen: BLOOD RIGHT HAND  Result Value Ref Range Status   Specimen Description   Final    BLOOD RIGHT HAND Performed at [facility], [address] [place], [place] [address]    Special Requests   Final    BOTTLES DRAWN AEROBIC ONLY Blood Culture results may not be optimal due to an inadequate volume of blood received in culture bottles Performed at [facility], [address], [place], [place] [address]    Culture   Final    NO GROWTH < 24 HOURS Performed at [facility], [address] [place], [place] [address]    Report Status PENDING  Incomplete  Culture, blood (Routine X 2) w Reflex to ID Panel     Status: None (Preliminary result)   Collection Time: 11/07/23  9:27 PM   Specimen: BLOOD RIGHT ARM  Result Value Ref Range Status   Specimen Description   Final    BLOOD RIGHT ARM Performed at [facility], [address] [place], [place] [address]  Special Requests   Final    BOTTLES DRAWN AEROBIC ONLY Blood Culture results may not be optimal due to an inadequate volume of blood received in culture bottles Performed at [facility], [address], [place], [place] [address]    Culture   Final    NO GROWTH < 12 HOURS Performed at [facility], [address], [place], [place] [address]    Report Status PENDING  Incomplete         Radiology Studies: DG C-Arm 1-60 Min-No Report Result Date: 11/07/2023 Fluoroscopy was utilized by the requesting physician.  No radiographic interpretation.   CT ANGIO CHEST/ABD/PEL FOR DISSECTION W &/OR WO CONTRAST Result Date: 11/07/2023 CLINICAL DATA:  Left chest pain radiating to left arm EXAM: CT ANGIOGRAPHY CHEST, ABDOMEN AND PELVIS TECHNIQUE: Non-contrast CT of the chest was initially obtained. Multidetector CT imaging through the chest, abdomen and pelvis was performed using the standard protocol during bolus  administration of intravenous contrast. Multiplanar reconstructed images and MIPs were obtained and reviewed to evaluate the vascular anatomy. RADIATION DOSE REDUCTION: This exam was performed according to the departmental dose-optimization program which includes automated exposure control, adjustment of the mA and/or kV according to patient size and/or use of iterative reconstruction technique. CONTRAST:  OMNIPAQUE  IOHEXOL  350 MG/ML SOLN COMPARISON:  CT chest abdomen pelvis September 09, 2023 FINDINGS: CTA CHEST FINDINGS Cardiovascular: Satisfactory opacification of the pulmonary arteries to the segmental level. No evidence of pulmonary embolism. Normal heart size. No pericardial effusion. Mediastinum/Nodes: No enlarged mediastinal, hilar, or axillary lymph nodes. Thyroid gland, trachea, demonstrate no significant findings. Hiatal hernia and mildly the mildly patulous esophagus. Lungs/Pleura: Lungs are clear. No pleural effusion or pneumothorax. Musculoskeletal: The no chest wall abnormality. No acute or significant osseous findings. Review of the MIP images confirms the above findings. CTA ABDOMEN AND PELVIS FINDINGS VASCULAR Aorta: Normal caliber aorta without aneurysm, dissection, vasculitis or significant stenosis. Celiac: Patent without evidence of aneurysm, dissection, vasculitis or significant stenosis. SMA: Patent without evidence of aneurysm, dissection, vasculitis or significant stenosis. Renals: Both renal arteries are patent without evidence of aneurysm, dissection, vasculitis, fibromuscular dysplasia or significant stenosis. IMA: Patent without evidence of aneurysm, dissection, vasculitis or significant stenosis. Inflow: Patent without evidence of aneurysm, dissection, vasculitis or significant stenosis. Veins: No obvious venous abnormality within the limitations of this arterial phase study. Review of the MIP images confirms the above findings. NON-VASCULAR Hepatobiliary: Hepatomegaly with hepatic  steatosis. Liver measures 21 cm in craniocaudal dimension. No suspicious enhancing lesion. Status post cholecystectomy with dilation of the common bile duct (reservoir phenomenon. Pancreas: Unremarkable. No pancreatic ductal dilatation or surrounding inflammatory changes. Spleen: Normal in size without focal abnormality. The Adrenals/Urinary Tract: Adrenal glands are unremarkable. There is a partially obstructive nephrolithiasis in proximal left ureter about 4 cm distal to UPJ measuring 6.5 mm with mild fullness of the left renal pelvis and collecting system proximal to the stone and mild peri ureteral fat stranding (image Apr 14, 1975) likely due to infectious/inflammatory changes. This stone was previously located in left kidney lower calyx and is now moved to proximal ureter. Additional nonobstructive nephrolithiasis in left kidney upper pole measuring 3 mm. Subcentimeter simple renal cortical cyst in left kidney interpolar region. Which does not require imaging follow-up. Bladder is thick-walled thick wall and under distended. Stomach/Bowel: Small hiatal hernia. Fat containing small stomach is within normal limits. Appendix appears normal. No evidence of bowel wall thickening, distention, or inflammatory changes. Lymphatic: No suspicious lymphadenopathy Reproductive: Heterogeneous lobulated  uterus.  No adnexal mass. Other: Tiny fat containing umbilical hernia with a defect measuring 11 mm. No abdominopelvic ascites. Musculoskeletal: No acute or significant osseous findings. Degenerative changes of the spine. Review of the MIP images confirms the above findings. IMPRESSION: Proximal left ureter partially obstructive calculus with mild fullness of the left collecting system and peri ureteral fat stranding. Additional punctate nonobstructive nephrolithiasis in left kidney upper pole. No suspicious vascular lesion . Hepatomegaly and hepatic steatosis. Cholecystectomy with mild biliary dilatation (reservoir phenomenon).  Electronically Signed   By: Megan  Zare M.D.   On: 11/07/2023 12:50   DG Chest 2 View Result Date: 11/07/2023 CLINICAL DATA:  Chest pain with nausea. EXAM: CHEST - 2 VIEW COMPARISON:  05/17/2023 FINDINGS: The lungs are clear without focal pneumonia, edema, pneumothorax or pleural effusion. The cardiopericardial silhouette is within normal limits for size. No acute bony abnormality. Telemetry leads overlie the chest. IMPRESSION: No active cardiopulmonary disease. Electronically Signed   By: Camellia Candle M.D.   On: 11/07/2023 07:14    Scheduled Meds:  atorvastatin   40 mg Oral QPM   enoxaparin  (LOVENOX ) injection  40 mg Subcutaneous Q24H   gabapentin   300 mg Oral Daily   loratadine   10 mg Oral QPM   Continuous Infusions:  meropenem  (MERREM ) IV 1 g (11/08/23 0507)     LOS: 1 day   Almarie KANDICE Hoots, MD 11/08/2023, 11:54 AM

## 2023-11-08 NOTE — Plan of Care (Incomplete)
  Problem: Health Behavior/Discharge Planning: Goal: Ability to manage health-related needs will improve Outcome: Progressing   Problem: Clinical Measurements: Goal: Ability to maintain clinical measurements within normal limits will improve Outcome: Progressing Goal: Will remain free from infection Outcome: Progressing Goal: Diagnostic test results will improve Outcome: Progressing Goal: Cardiovascular complication will be avoided Outcome: Progressing   Problem: Activity: Goal: Risk for activity intolerance will decrease Outcome: Progressing   Problem: Pain Managment: Goal: General experience of comfort will improve and/or be controlled Outcome: Progressing   Problem: Safety: Goal: Ability to remain free from injury will improve Outcome: Progressing   Problem: Education: Goal: Knowledge of General Education information will improve Description: Including pain rating scale, medication(s)/side effects and non-pharmacologic comfort measures Outcome: Adequate for Discharge   Problem: Clinical Measurements: Goal: Respiratory complications will improve Outcome: Adequate for Discharge   Problem: Nutrition: Goal: Adequate nutrition will be maintained Outcome: Adequate for Discharge   Problem: Coping: Goal: Level of anxiety will decrease Outcome: Adequate for Discharge   Problem: Elimination: Goal: Will not experience complications related to bowel motility Outcome: Adequate for Discharge Goal: Will not experience complications related to urinary retention Outcome: Adequate for Discharge   Problem: Skin Integrity: Goal: Risk for impaired skin integrity will decrease Outcome: Adequate for Discharge

## 2023-11-09 DIAGNOSIS — N200 Calculus of kidney: Secondary | ICD-10-CM | POA: Diagnosis not present

## 2023-11-09 LAB — CBC
HCT: 34.1 % — ABNORMAL LOW (ref 36.0–46.0)
Hemoglobin: 10.5 g/dL — ABNORMAL LOW (ref 12.0–15.0)
MCH: 27 pg (ref 26.0–34.0)
MCHC: 30.8 g/dL (ref 30.0–36.0)
MCV: 87.7 fL (ref 80.0–100.0)
Platelets: 180 K/uL (ref 150–400)
RBC: 3.89 MIL/uL (ref 3.87–5.11)
RDW: 14.7 % (ref 11.5–15.5)
WBC: 19.3 K/uL — ABNORMAL HIGH (ref 4.0–10.5)
nRBC: 0 % (ref 0.0–0.2)

## 2023-11-09 LAB — COMPREHENSIVE METABOLIC PANEL WITH GFR
ALT: 12 U/L (ref 0–44)
AST: 12 U/L — ABNORMAL LOW (ref 15–41)
Albumin: 2.5 g/dL — ABNORMAL LOW (ref 3.5–5.0)
Alkaline Phosphatase: 77 U/L (ref 38–126)
Anion gap: 9 (ref 5–15)
BUN: 42 mg/dL — ABNORMAL HIGH (ref 6–20)
CO2: 26 mmol/L (ref 22–32)
Calcium: 8.1 mg/dL — ABNORMAL LOW (ref 8.9–10.3)
Chloride: 103 mmol/L (ref 98–111)
Creatinine, Ser: 2.03 mg/dL — ABNORMAL HIGH (ref 0.44–1.00)
GFR, Estimated: 30 mL/min — ABNORMAL LOW (ref >60)
Glucose, Bld: 88 mg/dL (ref 70–99)
Potassium: 3.9 mmol/L (ref 3.5–5.1)
Sodium: 138 mmol/L (ref 135–145)
Total Bilirubin: 0.3 mg/dL (ref 0.0–1.2)
Total Protein: 6.4 g/dL — ABNORMAL LOW (ref 6.5–8.1)

## 2023-11-09 LAB — URINE CULTURE: Culture: >=100000 — AB

## 2023-11-09 LAB — TROPONIN I (HIGH SENSITIVITY): Troponin I (High Sensitivity): 6 ng/L (ref <18)

## 2023-11-09 MED ORDER — NITROGLYCERIN 0.4 MG SL SUBL
0.4000 mg | SUBLINGUAL_TABLET | SUBLINGUAL | Status: DC | PRN
  Administered 2023-11-09: 0.4 mg via SUBLINGUAL
  Filled 2023-11-09: qty 1

## 2023-11-09 MED ORDER — KETOROLAC TROMETHAMINE 15 MG/ML IJ SOLN
15.0000 mg | Freq: Once | INTRAMUSCULAR | Status: AC
  Administered 2023-11-09: 15 mg via INTRAVENOUS
  Filled 2023-11-09: qty 1

## 2023-11-09 MED ORDER — SENNOSIDES-DOCUSATE SODIUM 8.6-50 MG PO TABS
3.0000 | ORAL_TABLET | Freq: Every day | ORAL | Status: DC
  Administered 2023-11-12 – 2023-11-13 (×4): 3 via ORAL
  Filled 2023-11-09 (×2): qty 3

## 2023-11-09 MED ORDER — CEFAZOLIN SODIUM-DEXTROSE 2-4 GM/100ML-% IV SOLN
2.0000 g | Freq: Three times a day (TID) | INTRAVENOUS | Status: DC
  Administered 2023-11-09 – 2023-11-14 (×22): 2 g via INTRAVENOUS
  Filled 2023-11-09 (×16): qty 100

## 2023-11-09 MED ORDER — POLYETHYLENE GLYCOL 3350 17 G PO PACK
17.0000 g | PACK | Freq: Two times a day (BID) | ORAL | Status: DC
  Administered 2023-11-10 – 2023-11-13 (×8): 17 g via ORAL
  Filled 2023-11-09 (×7): qty 1

## 2023-11-09 MED ORDER — SODIUM CHLORIDE 0.9 % IV SOLN: INTRAVENOUS | Status: DC

## 2023-11-09 MED ORDER — DIPHENHYDRAMINE HCL 25 MG PO CAPS
25.0000 mg | ORAL_CAPSULE | Freq: Once | ORAL | Status: AC
  Administered 2023-11-09: 25 mg via ORAL
  Filled 2023-11-09: qty 1

## 2023-11-09 MED ORDER — LACTATED RINGERS IV BOLUS
500.0000 mL | Freq: Once | INTRAVENOUS | Status: AC
  Administered 2023-11-09: 500 mL via INTRAVENOUS

## 2023-11-09 NOTE — Progress Notes (Addendum)
     Patient Name: Tracie Green           DOB: 1975-09-01  MRN: [id number]      Admission Date: 11/07/2023  Attending Provider: Will Almarie MATSU, MD  Primary Diagnosis: Left nephrolithiasis   Level of care: Med-Surg   OVERNIGHT PROGRESS REPORT  Atypical chest pain- Patient having left sided chest pain under breast, 7/10.  Similar chest pain on admission.  Troponin negative. No improvement with nitroglycerin . Denies lightheadedness, shortness of breath, palpitations, nausea, vomiting.    Pain more prominent with inhalation.  Radiates to left shoulder.  Has chest wall tenderness.  CT angio chest/abdomen/pelvis-negative for PE, lungs clear with no pleural effusion or pneumothorax.  Plan:  EKG -NSR without acute ST segment changes Nitroglycerin  Troponin Muscle relaxer Toradol  x 1          Lavanda Horns, DNP, ACNPC- [facility] [place]

## 2023-11-09 NOTE — Progress Notes (Signed)
 PROGRESS NOTE    Tracie Green  FMW:[id number] DOB: 1975/04/14 DOA: 11/07/2023 PCP: Tobie Emmy Gully, PA-C   Brief Narrative: 48 y.o. female with medical history significant of nephrolithiasis, previous UTI with ESBL E. coli, hypertension, hyperlipidemia, depression, chronic pain syndrome on pain management, GERD who woke up today morning about 4 AM with severe left-sided chest pain, 10 out of 10, radiating to the left shoulder, ultimately settled to the left flank.  She had few episodes of vomiting associated with it.  Did not have any fever at home but she had temperature 100.3 at the ER.  She initially took some antacids without improvement so her daughter brought her to the emergency room.  Denies any change in urination.  Most recently treated for nephrolithiasis a year ago, ESBL E. coli.  Patient denies any recent changes in medications. ED Course: Patient arrived to [facility].  Temperature 100.3, heart rate 130, WBC count normal.  Creatinine 1.29.  Patient underwent CT angiogram of the chest abdomen pelvis, negative for dissection.  She has 6.5 mm UPJ stone.  Urine abnormal.  Patient was given multiple doses of pain medications, meropenem , 3 L of IV fluids and [facility] for [facility]rology procedure. On my exam at preop, patient had controlled symptoms.  She had remaining mild pain but denies any other complaints.  She is aware about cystoscopy procedures as she had this before.  Assessment & Plan:   Principal Problem:   Left nephrolithiasis Active Problems:   Essential hypertension   Recurrent UTI   GERD (gastroesophageal reflux disease)   Urolithiasis   Ureteral stone   1.  Sepsis secondary to left-sided obstructive nephrolithiasis with infected stone, acute UTI present on admission: Patient admitted with tachypnea tachycardia leukocytosis and low-grade fever.  UA was consistent with UTI.  Urine cultures pending.  Blood cultures pending.  Patient was started  on meropenem  continue.  Continue IV fluids.  Patient is status post cystoscopy and ureteroscopy with left ureteral stent insertion.   2 AKI creatinine continues to trend up.restart ivf.avoid nephrotoxins.follow up labs in am NO NSAIDS  2.  Essential hypertension: Blood pressures low normal to soft continue to hold antihypertensives.    3.  GERD: On PPI.  Continue.   4.  Chronic pain syndrome: Continue oxycodone  Zanaflex  and as needed Dilaudid    5.  Anxiety and insomnia: Restarted Xanax   6.  Morbid obesity with BMI 56.  Denies history of sleep apnea.  She will benefit with weight loss and outpatient follow-up.    Estimated body mass index is 56.12 kg/m as calculated from the following:   Height as of this encounter: 5' 9 (1.753 m).   Weight as of this encounter: 172.4 kg.  DVT prophylaxis: lovenox  Code Status: full Family Communication: none Disposition Plan:  Status is: Inpatient Remains inpatient appropriate because: acute illness   Consultants:  Urology  Procedures: Status post cystoscopy  Antimicrobials: Meropenem   Subjective: Had left sided cp overnight non cardiac resolved with toradol   Objective: Vitals:   11/09/23 0611 11/09/23 0706 11/09/23 0713 11/09/23 1238  BP: (!) 87/46 111/64 106/60 121/76  Pulse:    83  Resp:    17  Temp:    97.9 F (36.6 C)  TempSrc:    Oral  SpO2:    100%  Weight:      Height:        Intake/Output Summary (Last 24 hours) at 11/09/2023 1725 Last data filed at 11/09/2023 1400 Gross per  24 hour  Intake 480 ml  Output 100 ml  Net 380 ml   Filed Weights   11/07/23 0636  Weight: (!) 172.4 kg    Examination:  General exam: Appears in mild distress due to abdominal pain  respiratory system: Clear to auscultation. Respiratory effort normal. Cardiovascular system: S1 & S2 heard, RRR. No JVD, murmurs, rubs, gallops or clicks. No pedal edema. Gastrointestinal system: Abdomen is nondistended, soft left sided abdominal tenderness   Central nervous system: Alert and oriented. No focal neurological deficits. Extremities: Trace edema   Data Reviewed: I have personally reviewed following labs and imaging studies  CBC: Recent Labs  Lab 11/07/23 0712 11/08/23 0505 11/09/23 1258  WBC 9.1 30.8* 19.3*  NEUTROABS  --  28.3*  --   HGB 12.1 11.0* 10.5*  HCT 37.4 35.0* 34.1*  MCV 83.3 84.7 87.7  PLT 301 173 180   Basic Metabolic Panel: Recent Labs  Lab 11/07/23 0712 11/08/23 0505 11/09/23 1258  NA 139 139 138  K 4.0 4.5 3.9  CL 103 105 103  CO2 24 24 26   GLUCOSE 100* 144* 88  BUN 19 25* 42*  CREATININE 1.29* 1.77* 2.03*  CALCIUM  9.4 8.9 8.1*   GFR: Estimated Creatinine Clearance: 58.8 mL/min (A) (by C-G formula based on SCr of 2.03 mg/dL (H)). Liver Function Tests: Recent Labs  Lab 11/09/23 1258  AST 12*  ALT 12  ALKPHOS 77  BILITOT 0.3  PROT 6.4*  ALBUMIN  2.5*   No results for input(s): LIPASE, AMYLASE in the last 168 hours. No results for input(s): AMMONIA in the last 168 hours. Coagulation Profile: No results for input(s): INR, PROTIME in the last 168 hours. Cardiac Enzymes: No results for input(s): CKTOTAL, CKMB, CKMBINDEX, TROPONINI in the last 168 hours. BNP (last 3 results) No results for input(s): PROBNP in the last 8760 hours. HbA1C: No results for input(s): HGBA1C in the last 72 hours. CBG: No results for input(s): GLUCAP in the last 168 hours. Lipid Profile: No results for input(s): CHOL, HDL, LDLCALC, TRIG, CHOLHDL, LDLDIRECT in the last 72 hours. Thyroid Function Tests: No results for input(s): TSH, T4TOTAL, FREET4, T3FREE, THYROIDAB in the last 72 hours. Anemia Panel: No results for input(s): VITAMINB12, FOLATE, FERRITIN, TIBC, IRON, RETICCTPCT in the last 72 hours. Sepsis Labs: No results for input(s): PROCALCITON, LATICACIDVEN in the last 168 hours.  Recent Results (from the past 240 hours)  Urine Culture      Status: Abnormal   Collection Time: 11/07/23  8:30 AM   Specimen: Urine, Clean Catch  Result Value Ref Range Status   Specimen Description   Final    URINE, CLEAN CATCH Performed at [facility], [address] [place], [place] [address]    Special Requests   Final    NONE Performed at [facility], [address], [place], [place] [address]    Culture >=100,000 COLONIES/mL ESCHERICHIA COLI (A)  Final   Report Status 11/09/2023 FINAL  Final   Organism ID, Bacteria ESCHERICHIA COLI (A)  Final      Susceptibility   Escherichia coli - MIC*    AMPICILLIN <=2 SENSITIVE Sensitive     CEFAZOLIN  <=4 SENSITIVE Sensitive     CEFEPIME <=0.12 SENSITIVE Sensitive     CEFTRIAXONE <=0.25 SENSITIVE Sensitive     CIPROFLOXACIN >=4 RESISTANT Resistant     GENTAMICIN <=1 SENSITIVE Sensitive     IMIPENEM <=0.25 SENSITIVE Sensitive     NITROFURANTOIN  <=16 SENSITIVE Sensitive     TRIMETH /SULFA  <=20 SENSITIVE  Sensitive     AMPICILLIN/SULBACTAM <=2 SENSITIVE Sensitive     PIP/TAZO <=4 SENSITIVE Sensitive ug/mL    * >=100,000 COLONIES/mL ESCHERICHIA COLI  Culture, blood (Routine X 2) w Reflex to ID Panel     Status: None (Preliminary result)   Collection Time: 11/07/23  6:22 PM   Specimen: BLOOD RIGHT HAND  Result Value Ref Range Status   Specimen Description   Final    BLOOD RIGHT HAND Performed at [facility], [address], [place], [place] [address]    Special Requests   Final    BOTTLES DRAWN AEROBIC ONLY Blood Culture results may not be optimal due to an inadequate volume of blood received in culture bottles Performed at [facility], [address] [place], [place] [address]    Culture   Final    NO GROWTH 2 DAYS Performed at [facility], [address] [place], [place] [address]    Report Status PENDING  Incomplete  Culture, blood (Routine X 2) w Reflex to ID Panel     Status: None (Preliminary result)   Collection Time: 11/07/23  9:27 PM   Specimen:  BLOOD RIGHT ARM  Result Value Ref Range Status   Specimen Description   Final    BLOOD RIGHT ARM Performed at [facility], [address] [place], [place] [address]    Special Requests   Final    BOTTLES DRAWN AEROBIC ONLY Blood Culture results may not be optimal due to an inadequate volume of blood received in culture bottles Performed at [facility], [address], [place], [place] [address]    Culture   Final    NO GROWTH 2 DAYS Performed at [facility], [address] [place], [place] [address]    Report Status PENDING  Incomplete         Radiology Studies: DG C-Arm 1-60 Min-No Report Result Date: 11/07/2023 Fluoroscopy was utilized by the requesting physician.  No radiographic interpretation.    Scheduled Meds:  allopurinol   100 mg Oral BID   ALPRAZolam   0.5 mg Oral BID   atorvastatin   40 mg Oral QPM   enoxaparin  (LOVENOX ) injection  40 mg Subcutaneous Q24H   gabapentin   300 mg Oral Daily   loratadine   10 mg Oral QPM   oxyCODONE   10 mg Oral QID   Continuous Infusions:   ceFAZolin  (ANCEF ) IV 2 g (11/09/23 1715)     LOS: 2 days   Almarie KANDICE Hoots, MD 11/09/2023, 5:25 PM

## 2023-11-09 NOTE — Plan of Care (Signed)

## 2023-11-10 DIAGNOSIS — N200 Calculus of kidney: Secondary | ICD-10-CM | POA: Diagnosis not present

## 2023-11-10 LAB — COMPREHENSIVE METABOLIC PANEL WITH GFR
ALT: 10 U/L (ref 0–44)
AST: 10 U/L — ABNORMAL LOW (ref 15–41)
Albumin: 2.5 g/dL — ABNORMAL LOW (ref 3.5–5.0)
Alkaline Phosphatase: 73 U/L (ref 38–126)
Anion gap: 10 (ref 5–15)
BUN: 30 mg/dL — ABNORMAL HIGH (ref 6–20)
CO2: 24 mmol/L (ref 22–32)
Calcium: 8.2 mg/dL — ABNORMAL LOW (ref 8.9–10.3)
Chloride: 104 mmol/L (ref 98–111)
Creatinine, Ser: 1.26 mg/dL — ABNORMAL HIGH (ref 0.44–1.00)
GFR, Estimated: 53 mL/min — ABNORMAL LOW (ref >60)
Glucose, Bld: 108 mg/dL — ABNORMAL HIGH (ref 70–99)
Potassium: 4.1 mmol/L (ref 3.5–5.1)
Sodium: 138 mmol/L (ref 135–145)
Total Bilirubin: 0.4 mg/dL (ref 0.0–1.2)
Total Protein: 6.2 g/dL — ABNORMAL LOW (ref 6.5–8.1)

## 2023-11-10 LAB — CBC
HCT: 34.2 % — ABNORMAL LOW (ref 36.0–46.0)
Hemoglobin: 10.1 g/dL — ABNORMAL LOW (ref 12.0–15.0)
MCH: 26.3 pg (ref 26.0–34.0)
MCHC: 29.5 g/dL — ABNORMAL LOW (ref 30.0–36.0)
MCV: 89.1 fL (ref 80.0–100.0)
Platelets: 180 K/uL (ref 150–400)
RBC: 3.84 MIL/uL — ABNORMAL LOW (ref 3.87–5.11)
RDW: 14.6 % (ref 11.5–15.5)
WBC: 8.6 K/uL (ref 4.0–10.5)
nRBC: 0 % (ref 0.0–0.2)

## 2023-11-10 MED ORDER — METOPROLOL SUCCINATE ER 25 MG PO TB24
12.5000 mg | ORAL_TABLET | Freq: Two times a day (BID) | ORAL | Status: DC
  Administered 2023-11-10 – 2023-11-14 (×13): 12.5 mg via ORAL
  Filled 2023-11-10 (×8): qty 1

## 2023-11-10 MED ORDER — HYDROMORPHONE HCL 1 MG/ML IJ SOLN
0.5000 mg | INTRAMUSCULAR | Status: DC | PRN
  Administered 2023-11-10 – 2023-11-12 (×7): 0.5 mg via INTRAVENOUS
  Filled 2023-11-10 (×6): qty 0.5

## 2023-11-10 NOTE — Anesthesia Postprocedure Evaluation (Signed)
 Anesthesia Post Note  Patient: Therapist, occupational  Procedure(s) Performed: CYSTOSCOPY, WITH STENT INSERTION (Left: Ureter)     Patient location during evaluation: PACU Anesthesia Type: General Level of consciousness: awake and alert Pain management: pain level controlled Vital Signs Assessment: post-procedure vital signs reviewed and stable Respiratory status: spontaneous breathing, nonlabored ventilation, respiratory function stable and patient connected to nasal cannula oxygen Cardiovascular status: blood pressure returned to baseline and stable Postop Assessment: no apparent nausea or vomiting Anesthetic complications: no   There were no known notable events for this encounter.        Saumya Hukill D Mehar Kirkwood

## 2023-11-10 NOTE — Progress Notes (Signed)
 PROGRESS NOTE    Tracie Green  FMW:[id number] DOB: Apr 25, 1975 DOA: 11/07/2023 PCP: Tobie Emmy Gully, PA-C   Brief Narrative: 48 y.o. female with medical history significant of nephrolithiasis, previous UTI with ESBL E. coli, hypertension, hyperlipidemia, depression, chronic pain syndrome on pain management, GERD who woke up today morning about 4 AM with severe left-sided chest pain, 10 out of 10, radiating to the left shoulder, ultimately settled to the left flank.  She had few episodes of vomiting associated with it.  Did not have any fever at home but she had temperature 100.3 at the ER.  She initially took some antacids without improvement so her daughter brought her to the emergency room.  Denies any change in urination.  Most recently treated for nephrolithiasis a year ago, ESBL E. coli.  Patient denies any recent changes in medications. ED Course: Patient arrived to [facility].  Temperature 100.3, heart rate 130, WBC count normal.  Creatinine 1.29.  Patient underwent CT angiogram of the chest abdomen pelvis, negative for dissection.  She has 6.5 mm UPJ stone.  Urine abnormal.  Patient was given multiple doses of pain medications, meropenem , 3 L of IV fluids and was brought to the lung hospital for urology procedure. On my exam at preop, patient had controlled symptoms.  She had remaining mild pain but denies any other complaints.  She is aware about cystoscopy procedures as she had this before.  Assessment & Plan:   Principal Problem:   Left nephrolithiasis Active Problems:   Essential hypertension   Recurrent UTI   GERD (gastroesophageal reflux disease)   Urolithiasis   Ureteral stone   1.  Sepsis secondary to left-sided obstructive nephrolithiasis with infected stone, acute UTI present on admission: Patient admitted with tachypnea tachycardia leukocytosis and low-grade fever.  UA was consistent with UTI.  Urine cultures E. coli sensitive to all but ciprofloxacin.  Blood  cultures no growth so far.  Antibiotics changed to Ancef  on 11/09/2023.   Continue IV fluids.  Patient is status post cystoscopy and ureteroscopy with left ureteral stent insertion.   2 AKI creatinine finally starting to trend down 1.26 from 2.03 continue IV fluids continue to hold nephrotoxins and NSAIDs.    2.  Essential hypertension: Blood pressure is starting to rise up also tachycardic prior to admission she was on Norvasc  and Cozaar  will continue to hold these and started on metoprolol  12.5 twice a day.    3.  GERD: On PPI.  Continue.   4.  Chronic pain syndrome: Continue oxycodone  Zanaflex  and as needed Dilaudid    5.  Anxiety and insomnia: Restarted Xanax   6.  Morbid obesity with BMI 56.  Denies history of sleep apnea.  She will benefit with weight loss and outpatient follow-up.    Estimated body mass index is 56.12 kg/m as calculated from the following:   Height as of this encounter: 5' 9 (1.753 m).   Weight as of this encounter: 172.4 kg.  DVT prophylaxis: lovenox  Code Status: full Family Communication: none Disposition Plan:  Status is: Inpatient Remains inpatient appropriate because: acute illness   Consultants:  Urology  Procedures: Status post cystoscopy  Antimicrobials: Meropenem   Subjective:  No complaints overnight had a bowel movement yesterday remains on IV fluids still has pain on the left side of her abdomen where the stent is  Objective: Vitals:   11/09/23 0713 11/09/23 1238 11/09/23 2037 11/10/23 0402  BP: 106/60 121/76 132/83 (!) 141/89  Pulse:  83 92 92  Resp:  17 16 16   Temp:  97.9 F (36.6 C) 97.6 F (36.4 C) 98.1 F (36.7 C)  TempSrc:  Oral Axillary Oral  SpO2:  100% 100% 99%  Weight:      Height:        Intake/Output Summary (Last 24 hours) at 11/10/2023 1205 Last data filed at 11/10/2023 1101 Gross per 24 hour  Intake 2016.47 ml  Output 400 ml  Net 1616.47 ml   Filed Weights   11/07/23 0636  Weight: (!) 172.4 kg     Examination:  General exam: Appears in mild distress due to abdominal pain  respiratory system: Clear to auscultation. Respiratory effort normal. Cardiovascular system: S1 & S2 heard, RRR. No JVD, murmurs, rubs, gallops or clicks. No pedal edema. Gastrointestinal system: Abdomen is nondistended, soft left sided abdominal tenderness  Central nervous system: Alert and oriented. No focal neurological deficits. Extremities: Trace edema   Data Reviewed: I have personally reviewed following labs and imaging studies  CBC: Recent Labs  Lab 11/07/23 0712 11/08/23 0505 11/09/23 1258 11/10/23 0539  WBC 9.1 30.8* 19.3* 8.6  NEUTROABS  --  28.3*  --   --   HGB 12.1 11.0* 10.5* 10.1*  HCT 37.4 35.0* 34.1* 34.2*  MCV 83.3 84.7 87.7 89.1  PLT 301 173 180 180   Basic Metabolic Panel: Recent Labs  Lab 11/07/23 0712 11/08/23 0505 11/09/23 1258 11/10/23 0539  NA 139 139 138 138  K 4.0 4.5 3.9 4.1  CL 103 105 103 104  CO2 24 24 26 24   GLUCOSE 100* 144* 88 108*  BUN 19 25* 42* 30*  CREATININE 1.29* 1.77* 2.03* 1.26*  CALCIUM  9.4 8.9 8.1* 8.2*   GFR: Estimated Creatinine Clearance: 94.7 mL/min (A) (by C-G formula based on SCr of 1.26 mg/dL (H)). Liver Function Tests: Recent Labs  Lab 11/09/23 1258 11/10/23 0539  AST 12* 10*  ALT 12 10  ALKPHOS 77 73  BILITOT 0.3 0.4  PROT 6.4* 6.2*  ALBUMIN  2.5* 2.5*   No results for input(s): LIPASE, AMYLASE in the last 168 hours. No results for input(s): AMMONIA in the last 168 hours. Coagulation Profile: No results for input(s): INR, PROTIME in the last 168 hours. Cardiac Enzymes: No results for input(s): CKTOTAL, CKMB, CKMBINDEX, TROPONINI in the last 168 hours. BNP (last 3 results) No results for input(s): PROBNP in the last 8760 hours. HbA1C: No results for input(s): HGBA1C in the last 72 hours. CBG: No results for input(s): GLUCAP in the last 168 hours. Lipid Profile: No results for input(s): CHOL,  HDL, LDLCALC, TRIG, CHOLHDL, LDLDIRECT in the last 72 hours. Thyroid Function Tests: No results for input(s): TSH, T4TOTAL, FREET4, T3FREE, THYROIDAB in the last 72 hours. Anemia Panel: No results for input(s): VITAMINB12, FOLATE, FERRITIN, TIBC, IRON, RETICCTPCT in the last 72 hours. Sepsis Labs: No results for input(s): PROCALCITON, LATICACIDVEN in the last 168 hours.  Recent Results (from the past 240 hours)  Urine Culture     Status: Abnormal   Collection Time: 11/07/23  8:30 AM   Specimen: Urine, Clean Catch  Result Value Ref Range Status   Specimen Description   Final    URINE, CLEAN CATCH Performed at [facility], [address], [place], [place] [address]    Special Requests   Final    NONE Performed at [facility], [address] [place], [place] [address]    Culture >=100,000 COLONIES/mL ESCHERICHIA COLI (A)  Final   Report Status 11/09/2023 FINAL  Final   Organism  ID, Bacteria ESCHERICHIA COLI (A)  Final      Susceptibility   Escherichia coli - MIC*    AMPICILLIN <=2 SENSITIVE Sensitive     CEFAZOLIN  <=4 SENSITIVE Sensitive     CEFEPIME <=0.12 SENSITIVE Sensitive     CEFTRIAXONE <=0.25 SENSITIVE Sensitive     CIPROFLOXACIN >=4 RESISTANT Resistant     GENTAMICIN <=1 SENSITIVE Sensitive     IMIPENEM <=0.25 SENSITIVE Sensitive     NITROFURANTOIN  <=16 SENSITIVE Sensitive     TRIMETH /SULFA  <=20 SENSITIVE Sensitive     AMPICILLIN/SULBACTAM <=2 SENSITIVE Sensitive     PIP/TAZO <=4 SENSITIVE Sensitive ug/mL    * >=100,000 COLONIES/mL ESCHERICHIA COLI  Culture, blood (Routine X 2) w Reflex to ID Panel     Status: None (Preliminary result)   Collection Time: 11/07/23  6:22 PM   Specimen: BLOOD RIGHT HAND  Result Value Ref Range Status   Specimen Description   Final    BLOOD RIGHT HAND Performed at [facility], [address] [place], [place] [address]    Special Requests   Final    BOTTLES DRAWN AEROBIC ONLY Blood Culture  results may not be optimal due to an inadequate volume of blood received in culture bottles Performed at [facility], [address], [place], [place] [address]    Culture   Final    NO GROWTH 3 DAYS Performed at [facility], [address], [place], [place] [address]    Report Status PENDING  Incomplete  Culture, blood (Routine X 2) w Reflex to ID Panel     Status: None (Preliminary result)   Collection Time: 11/07/23  9:27 PM   Specimen: BLOOD RIGHT ARM  Result Value Ref Range Status   Specimen Description   Final    BLOOD RIGHT ARM Performed at [facility], [address] [place], [place] [address]    Special Requests   Final    BOTTLES DRAWN AEROBIC ONLY Blood Culture results may not be optimal due to an inadequate volume of blood received in culture bottles Performed at [facility], [address] [place], [place] [address]    Culture   Final    NO GROWTH 3 DAYS Performed at [facility], [address], [place], [place] [address]    Report Status PENDING  Incomplete     Radiology Studies: No results found.   Scheduled Meds:  allopurinol   100 mg Oral BID   ALPRAZolam   0.5 mg Oral BID   atorvastatin   40 mg Oral QPM   enoxaparin  (LOVENOX ) injection  40 mg Subcutaneous Q24H   gabapentin   300 mg Oral Daily   loratadine   10 mg Oral QPM   oxyCODONE   10 mg Oral QID   polyethylene glycol  17 g Oral BID   senna-docusate  3 tablet Oral QHS   Continuous Infusions:  sodium chloride  100 mL/hr at 11/10/23 0346    ceFAZolin  (ANCEF ) IV 2 g (11/10/23 0516)     LOS: 3 days   Almarie KANDICE Hoots, MD 11/10/2023, 12:05 PM

## 2023-11-11 DIAGNOSIS — N200 Calculus of kidney: Secondary | ICD-10-CM | POA: Diagnosis not present

## 2023-11-11 LAB — COMPREHENSIVE METABOLIC PANEL WITH GFR
ALT: 47 U/L — ABNORMAL HIGH (ref 0–44)
AST: 66 U/L — ABNORMAL HIGH (ref 15–41)
Albumin: 2.4 g/dL — ABNORMAL LOW (ref 3.5–5.0)
Alkaline Phosphatase: 135 U/L — ABNORMAL HIGH (ref 38–126)
Anion gap: 6 (ref 5–15)
BUN: 21 mg/dL — ABNORMAL HIGH (ref 6–20)
CO2: 26 mmol/L (ref 22–32)
Calcium: 8.4 mg/dL — ABNORMAL LOW (ref 8.9–10.3)
Chloride: 104 mmol/L (ref 98–111)
Creatinine, Ser: 1.09 mg/dL — ABNORMAL HIGH (ref 0.44–1.00)
GFR, Estimated: >60 mL/min (ref >60)
Glucose, Bld: 85 mg/dL (ref 70–99)
Potassium: 4.3 mmol/L (ref 3.5–5.1)
Sodium: 136 mmol/L (ref 135–145)
Total Bilirubin: 0.3 mg/dL (ref 0.0–1.2)
Total Protein: 6.3 g/dL — ABNORMAL LOW (ref 6.5–8.1)

## 2023-11-11 LAB — CBC
HCT: 31.8 % — ABNORMAL LOW (ref 36.0–46.0)
Hemoglobin: 9.8 g/dL — ABNORMAL LOW (ref 12.0–15.0)
MCH: 26.4 pg (ref 26.0–34.0)
MCHC: 30.8 g/dL (ref 30.0–36.0)
MCV: 85.7 fL (ref 80.0–100.0)
Platelets: 191 K/uL (ref 150–400)
RBC: 3.71 MIL/uL — ABNORMAL LOW (ref 3.87–5.11)
RDW: 14.3 % (ref 11.5–15.5)
WBC: 6.3 K/uL (ref 4.0–10.5)
nRBC: 0 % (ref 0.0–0.2)

## 2023-11-11 MED ORDER — AMLODIPINE BESYLATE 5 MG PO TABS
5.0000 mg | ORAL_TABLET | Freq: Every day | ORAL | Status: DC
  Administered 2023-11-11 – 2023-11-14 (×7): 5 mg via ORAL
  Filled 2023-11-11 (×4): qty 1

## 2023-11-11 NOTE — Progress Notes (Signed)
 PROGRESS NOTE    Tracie Green  FMW:[id number] DOB: 12/12/1975 DOA: 11/07/2023 PCP: Tobie Emmy Gully, PA-C   Brief Narrative: 48 y.o. female with medical history significant of nephrolithiasis, previous UTI with ESBL E. coli, hypertension, hyperlipidemia, depression, chronic pain syndrome on pain management, GERD who woke up today morning about 4 AM with severe left-sided chest pain, 10 out of 10, radiating to the left shoulder, ultimately settled to the left flank.  She had few episodes of vomiting associated with it.  Did not have any fever at home but she had temperature 100.3 at the ER.  She initially took some antacids without improvement so her daughter brought her to the emergency room.  Denies any change in urination.  Most recently treated for nephrolithiasis a year ago, ESBL E. coli.  Patient denies any recent changes in medications. ED Course: Patient arrived to [facility].  Temperature 100.3, heart rate 130, WBC count normal.  Creatinine 1.29.  Patient underwent CT angiogram of the chest abdomen pelvis, negative for dissection.  She has 6.5 mm UPJ stone.  Urine abnormal.  Patient was given multiple doses of pain medications, meropenem , 3 L of IV fluids and was [facility] for urology procedure. On my exam at preop, patient had controlled symptoms.  She had remaining mild pain but denies any other complaints.  She is aware about cystoscopy procedures as she had this before.  Assessment & Plan:   Principal Problem:   Left nephrolithiasis Active Problems:   Essential hypertension   Recurrent UTI   GERD (gastroesophageal reflux disease)   Urolithiasis   Ureteral stone   1.  Sepsis secondary to left-sided obstructive nephrolithiasis with infected stone, acute UTI present on admission: Patient admitted with tachypnea tachycardia leukocytosis and low-grade fever.  UA was consistent with UTI.  Urine cultures E. coli sensitive to all but ciprofloxacin.  Blood  cultures no growth so far.  Antibiotics changed to Ancef  on 11/09/2023.  Patient is status post cystoscopy and ureteroscopy with left ureteral stent insertion.  She had some complaints of hematuria, I try to explain to her some of the hematuria can be normal after stent placed.  Hemoglobin remained stable.  She also is having her menstrual period.  Hopefully she can be discharged tomorrow after seen by urology on oral antibiotics.   2 AKI creatinine finally starting to trend down 1.09 from 1.26 from 2.03 continue to hold nephrotoxins and NSAIDs.  She received IV fluids till the 10th.  2.  Essential hypertension: Continue metoprolol  12.5 twice daily restart Norvasc .  Cozaar  still on hold.  .    3.  GERD: On PPI.  Continue.   4.  Chronic pain syndrome: Continue oxycodone  Zanaflex  and as needed Dilaudid    5.  Anxiety and insomnia: Restarted Xanax   6.  Morbid obesity with BMI 56.  Denies history of sleep apnea.  She will benefit with weight loss and outpatient follow-up.    Estimated body mass index is 56.12 kg/m as calculated from the following:   Height as of this encounter: 5' 9 (1.753 m).   Weight as of this encounter: 172.4 kg.  DVT prophylaxis: lovenox  Code Status: full Family Communication: none Disposition Plan:  Status is: Inpatient Remains inpatient appropriate because: acute illness   Consultants:  Urology  Procedures: Status post cystoscopy  Antimicrobials: Meropenem   Subjective:   Had concerns about bleeding overnight nurse thinks she is bleeding from her vagina patient thinks she is bleeding from the urethra her  last menstrual period cycle was a month 10/16/2023 Objective: Vitals:   11/10/23 1343 11/10/23 2032 11/11/23 0511 11/11/23 1232  BP: (!) 163/95 128/75 (!) 140/78 (!) 159/94  Pulse: 94 89 85 82  Resp: 17 20 17 18   Temp: 98.3 F (36.8 C) 98.4 F (36.9 C) 98 F (36.7 C) (!) 97.5 F (36.4 C)  TempSrc:  Oral  Oral  SpO2: 100% 100% 100% 100%  Weight:       Height:        Intake/Output Summary (Last 24 hours) at 11/11/2023 1340 Last data filed at 11/11/2023 1100 Gross per 24 hour  Intake 360 ml  Output 2050 ml  Net -1690 ml   Filed Weights   11/07/23 0636  Weight: (!) 172.4 kg    Examination:  General exam: Appears in mild distress due to abdominal pain  respiratory system: Clear to auscultation. Respiratory effort normal. Cardiovascular system: S1 & S2 heard, RRR. No JVD, murmurs, rubs, gallops or clicks. No pedal edema. Gastrointestinal system: Abdomen is nondistended, soft left sided abdominal tenderness  Central nervous system: Alert and oriented. No focal neurological deficits. Extremities: Trace edema   Data Reviewed: I have personally reviewed following labs and imaging studies  CBC: Recent Labs  Lab 11/07/23 0712 11/08/23 0505 11/09/23 1258 11/10/23 0539 11/11/23 0646  WBC 9.1 30.8* 19.3* 8.6 6.3  NEUTROABS  --  28.3*  --   --   --   HGB 12.1 11.0* 10.5* 10.1* 9.8*  HCT 37.4 35.0* 34.1* 34.2* 31.8*  MCV 83.3 84.7 87.7 89.1 85.7  PLT 301 173 180 180 191   Basic Metabolic Panel: Recent Labs  Lab 11/07/23 0712 11/08/23 0505 11/09/23 1258 11/10/23 0539 11/11/23 0646  NA 139 139 138 138 136  K 4.0 4.5 3.9 4.1 4.3  CL 103 105 103 104 104  CO2 24 24 26 24 26   GLUCOSE 100* 144* 88 108* 85  BUN 19 25* 42* 30* 21*  CREATININE 1.29* 1.77* 2.03* 1.26* 1.09*  CALCIUM  9.4 8.9 8.1* 8.2* 8.4*   GFR: Estimated Creatinine Clearance: 109.5 mL/min (A) (by C-G formula based on SCr of 1.09 mg/dL (H)). Liver Function Tests: Recent Labs  Lab 11/09/23 1258 11/10/23 0539 11/11/23 0646  AST 12* 10* 66*  ALT 12 10 47*  ALKPHOS 77 73 135*  BILITOT 0.3 0.4 0.3  PROT 6.4* 6.2* 6.3*  ALBUMIN  2.5* 2.5* 2.4*   No results for input(s): LIPASE, AMYLASE in the last 168 hours. No results for input(s): AMMONIA in the last 168 hours. Coagulation Profile: No results for input(s): INR, PROTIME in the last 168  hours. Cardiac Enzymes: No results for input(s): CKTOTAL, CKMB, CKMBINDEX, TROPONINI in the last 168 hours. BNP (last 3 results) No results for input(s): PROBNP in the last 8760 hours. HbA1C: No results for input(s): HGBA1C in the last 72 hours. CBG: No results for input(s): GLUCAP in the last 168 hours. Lipid Profile: No results for input(s): CHOL, HDL, LDLCALC, TRIG, CHOLHDL, LDLDIRECT in the last 72 hours. Thyroid Function Tests: No results for input(s): TSH, T4TOTAL, FREET4, T3FREE, THYROIDAB in the last 72 hours. Anemia Panel: No results for input(s): VITAMINB12, FOLATE, FERRITIN, TIBC, IRON, RETICCTPCT in the last 72 hours. Sepsis Labs: No results for input(s): PROCALCITON, LATICACIDVEN in the last 168 hours.  Recent Results (from the past 240 hours)  Urine Culture     Status: Abnormal   Collection Time: 11/07/23  8:30 AM   Specimen: Urine, Clean Catch  Result Value Ref Range  Status   Specimen Description   Final    URINE, CLEAN CATCH Performed at [facility], [address], [place], [place] [address]    Special Requests   Final    NONE Performed at [facility], [address] [place], [place] [address]    Culture >=100,000 COLONIES/mL ESCHERICHIA COLI (A)  Final   Report Status 11/09/2023 FINAL  Final   Organism ID, Bacteria ESCHERICHIA COLI (A)  Final      Susceptibility   Escherichia coli - MIC*    AMPICILLIN <=2 SENSITIVE Sensitive     CEFAZOLIN  <=4 SENSITIVE Sensitive     CEFEPIME <=0.12 SENSITIVE Sensitive     CEFTRIAXONE <=0.25 SENSITIVE Sensitive     CIPROFLOXACIN >=4 RESISTANT Resistant     GENTAMICIN <=1 SENSITIVE Sensitive     IMIPENEM <=0.25 SENSITIVE Sensitive     NITROFURANTOIN  <=16 SENSITIVE Sensitive     TRIMETH /SULFA  <=20 SENSITIVE Sensitive     AMPICILLIN/SULBACTAM <=2 SENSITIVE Sensitive     PIP/TAZO <=4 SENSITIVE Sensitive ug/mL    * >=100,000 COLONIES/mL ESCHERICHIA COLI  Culture, blood  (Routine X 2) w Reflex to ID Panel     Status: None (Preliminary result)   Collection Time: 11/07/23  6:22 PM   Specimen: BLOOD RIGHT HAND  Result Value Ref Range Status   Specimen Description   Final    BLOOD RIGHT HAND Performed at [facility], [address], [place], [place] [address]    Special Requests   Final    BOTTLES DRAWN AEROBIC ONLY Blood Culture results may not be optimal due to an inadequate volume of blood received in culture bottles Performed at [facility], [address] [place], [place] [address]    Culture   Final    NO GROWTH 4 DAYS Performed at [facility], [address] [place], [place] [address]    Report Status PENDING  Incomplete  Culture, blood (Routine X 2) w Reflex to ID Panel     Status: None (Preliminary result)   Collection Time: 11/07/23  9:27 PM   Specimen: BLOOD RIGHT ARM  Result Value Ref Range Status   Specimen Description   Final    BLOOD RIGHT ARM Performed at [facility], [address] [place], [place] [address]    Special Requests   Final    BOTTLES DRAWN AEROBIC ONLY Blood Culture results may not be optimal due to an inadequate volume of blood received in culture bottles Performed at [facility], [address] [place], [place] [address]    Culture   Final    NO GROWTH 4 DAYS Performed at [facility], [address], [place], [place] [address]    Report Status PENDING  Incomplete     Radiology Studies: No results found.   Scheduled Meds:  allopurinol   100 mg Oral BID   ALPRAZolam   0.5 mg Oral BID   atorvastatin   40 mg Oral QPM   enoxaparin  (LOVENOX ) injection  40 mg Subcutaneous Q24H   gabapentin   300 mg Oral Daily   loratadine   10 mg Oral QPM   metoprolol  succinate  12.5 mg Oral BID   oxyCODONE   10 mg Oral QID   polyethylene glycol  17 g Oral BID   senna-docusate  3 tablet Oral QHS   Continuous Infusions:  sodium chloride  100 mL/hr at  11/11/23 1128    ceFAZolin  (ANCEF ) IV 2 g (11/11/23 0546)  LOS: 4 days   Almarie KANDICE Hoots, MD 11/11/2023, 1:40 PM

## 2023-11-11 NOTE — Progress Notes (Signed)
 Pt with moderate size spot of blood on bed pad when getting up to [facility]. Pt has been c/o bleeding when she urinates and this has been concerning to her. Upon assessment, blood observed coming from the vagina and not the urethra. Pt states she didn't think it was time for her next menstrual period but according to chart, LMP was 10/14/23.

## 2023-11-11 NOTE — Progress Notes (Signed)
 Late entry! The patient requested to speak to the charge nurse. She has concerns that she has been bleeding moderately throughout the day, and " No one is telling me why," she states that she is having increased pain in her left flank area, with no relief from scheduled pain meds. Page on-call NP Lavanda Horns, who came to the patient's bedside to assess the patient. An order was given for am Labs and IV--Dildudid for increased pain. Meds were given per order. The patient tolerated t well. Support was given to the patient, and ice was placed on her side per her request. The plan of care is ongoing.

## 2023-11-12 DIAGNOSIS — N200 Calculus of kidney: Secondary | ICD-10-CM | POA: Diagnosis not present

## 2023-11-12 LAB — COMPREHENSIVE METABOLIC PANEL WITH GFR
ALT: 37 U/L (ref 0–44)
AST: 49 U/L — ABNORMAL HIGH (ref 15–41)
Albumin: 2.4 g/dL — ABNORMAL LOW (ref 3.5–5.0)
Alkaline Phosphatase: 179 U/L — ABNORMAL HIGH (ref 38–126)
Anion gap: 8 (ref 5–15)
BUN: 18 mg/dL (ref 6–20)
CO2: 25 mmol/L (ref 22–32)
Calcium: 8.5 mg/dL — ABNORMAL LOW (ref 8.9–10.3)
Chloride: 103 mmol/L (ref 98–111)
Creatinine, Ser: 0.87 mg/dL (ref 0.44–1.00)
GFR, Estimated: >60 mL/min (ref >60)
Glucose, Bld: 99 mg/dL (ref 70–99)
Potassium: 4.1 mmol/L (ref 3.5–5.1)
Sodium: 136 mmol/L (ref 135–145)
Total Bilirubin: 0.3 mg/dL (ref 0.0–1.2)
Total Protein: 6.2 g/dL — ABNORMAL LOW (ref 6.5–8.1)

## 2023-11-12 LAB — CBC
HCT: 33.2 % — ABNORMAL LOW (ref 36.0–46.0)
Hemoglobin: 10.2 g/dL — ABNORMAL LOW (ref 12.0–15.0)
MCH: 26.2 pg (ref 26.0–34.0)
MCHC: 30.7 g/dL (ref 30.0–36.0)
MCV: 85.1 fL (ref 80.0–100.0)
Platelets: 202 K/uL (ref 150–400)
RBC: 3.9 MIL/uL (ref 3.87–5.11)
RDW: 14 % (ref 11.5–15.5)
WBC: 6.6 K/uL (ref 4.0–10.5)
nRBC: 0 % (ref 0.0–0.2)

## 2023-11-12 LAB — CULTURE, BLOOD (ROUTINE X 2)
Culture: NO GROWTH
Culture: NO GROWTH

## 2023-11-12 MED ORDER — HYOSCYAMINE SULFATE 0.125 MG PO TBDP
0.1250 mg | ORAL_TABLET | ORAL | Status: DC | PRN
  Administered 2023-11-12 (×2): 0.125 mg via SUBLINGUAL
  Filled 2023-11-12 (×2): qty 1

## 2023-11-12 MED ORDER — HYDROMORPHONE HCL 1 MG/ML IJ SOLN
0.5000 mg | INTRAMUSCULAR | Status: DC | PRN
  Administered 2023-11-12 – 2023-11-14 (×6): 1 mg via INTRAVENOUS
  Filled 2023-11-12 (×3): qty 1

## 2023-11-12 MED ORDER — DIPHENHYDRAMINE HCL 25 MG PO CAPS
25.0000 mg | ORAL_CAPSULE | Freq: Once | ORAL | Status: AC | PRN
  Administered 2023-11-12 (×2): 25 mg via ORAL
  Filled 2023-11-12: qty 1

## 2023-11-12 NOTE — Plan of Care (Signed)

## 2023-11-12 NOTE — Progress Notes (Signed)
 PROGRESS NOTE    Tracie Green  FMW:[id number] DOB: Jan 06, 1976 DOA: 11/07/2023 PCP: Tobie Emmy Gully, PA-C   Brief Narrative: 48 y.o. female with medical history significant of nephrolithiasis, previous UTI with ESBL E. coli, hypertension, hyperlipidemia, depression, chronic pain syndrome on pain management, GERD who woke up today morning about 4 AM with severe left-sided chest pain, 10 out of 10, radiating to the left shoulder, ultimately settled to the left flank.  She had few episodes of vomiting associated with it.  Did not have any fever at home but she had temperature 100.3 at the ER.  She initially took some antacids without improvement so her daughter brought her to the emergency room.  Denies any change in urination.  Most recently treated for nephrolithiasis a year ago, ESBL E. coli.  Patient denies any recent changes in medications. ED Course: Patient arrived to [facility].  Temperature 100.3, heart rate 130, WBC count normal.  Creatinine 1.29.  Patient underwent CT angiogram of the chest abdomen pelvis, negative for dissection.  She has 6.5 mm UPJ stone.  Urine abnormal.  Patient was given multiple doses of pain medications, meropenem , 3 L of IV fluids and was [facility] for urology procedure. On my exam at preop, patient had controlled symptoms.  She had remaining mild pain but denies any other complaints.  She is aware about cystoscopy procedures as she had this before.  Assessment & Plan:   Principal Problem:   Left nephrolithiasis Active Problems:   Essential hypertension   Recurrent UTI   GERD (gastroesophageal reflux disease)   Urolithiasis   Ureteral stone   1.  Sepsis secondary to left-sided obstructive nephrolithiasis with infected stone, acute UTI present on admission: Patient admitted with tachypnea tachycardia leukocytosis and low-grade fever.  UA was consistent with UTI.  Urine cultures E. coli sensitive to all but ciprofloxacin.  Blood  cultures no growth so far.  Antibiotics changed to Ancef  on 11/09/2023.  Patient is status post cystoscopy and ureteroscopy with left ureteral stent insertion.  She had some complaints of hematuria, I try to explain to her some of the hematuria can be normal after stent placed.  Hemoglobin remained stable.  She also is having her menstrual period.  She feels like she is not ready to go home today but she has agreed to go home tomorrow.     2 AKI creatinine this has resolved with IV fluids.    2.  Essential hypertension: Continue metoprolol  12.5 twice daily restart Norvasc .  Cozaar  still on hold.  Blood pressure soft to low normal.    3.  GERD: On PPI.  Continue.   4.  Chronic pain syndrome: Continue oxycodone  Zanaflex  and as needed Dilaudid    5.  Anxiety and insomnia: Restarted Xanax   6.  Morbid obesity with BMI 56.  Denies history of sleep apnea.  She will benefit with weight loss and outpatient follow-up.    Estimated body mass index is 56.12 kg/m as calculated from the following:   Height as of this encounter: 5' 9 (1.753 m).   Weight as of this encounter: 172.4 kg.  DVT prophylaxis: lovenox  Code Status: full Family Communication: none Disposition Plan:  Status is: Inpatient Remains inpatient appropriate because: acute illness   Consultants:  Urology  Procedures: Status post cystoscopy  Antimicrobials: Ancef  Subjective:  Complaining of a lot of pain 7 out of 10 in the left CVA radiating to her left side of her abdomen no BM for 2 days  does see some blood in the urine seen by urology added hyoscyamine .  She feels she is not ready to be discharged home today she agrees to leave tomorrow I encouraged her to walk in the hallway and get out of bed. Objective: Vitals:   11/11/23 0511 11/11/23 1232 11/11/23 2026 11/12/23 0415  BP: (!) 140/78 (!) 159/94 115/76 125/60  Pulse: 85 82 91 80  Resp: 17 18 20 20   Temp: 98 F (36.7 C) (!) 97.5 F (36.4 C) 98.1 F (36.7 C) 98.5 F (36.9  C)  TempSrc:  Oral Oral Oral  SpO2: 100% 100% 100% 97%  Weight:      Height:        Intake/Output Summary (Last 24 hours) at 11/12/2023 1213 Last data filed at 11/12/2023 0836 Gross per 24 hour  Intake 1210.11 ml  Output 2550 ml  Net -1339.89 ml   Filed Weights   11/07/23 0636  Weight: (!) 172.4 kg    Examination:  General exam: Appears in mild distress due to abdominal pain  respiratory system: Clear to auscultation. Respiratory effort normal. Cardiovascular system: S1 & S2 heard, RRR. No JVD, murmurs, rubs, gallops or clicks. No pedal edema. Gastrointestinal system: Abdomen is nondistended, soft left sided abdominal tenderness  Central nervous system: Alert and oriented. No focal neurological deficits. Extremities: Trace edema   Data Reviewed: I have personally reviewed following labs and imaging studies  CBC: Recent Labs  Lab 11/08/23 0505 11/09/23 1258 11/10/23 0539 11/11/23 0646 11/12/23 0507  WBC 30.8* 19.3* 8.6 6.3 6.6  NEUTROABS 28.3*  --   --   --   --   HGB 11.0* 10.5* 10.1* 9.8* 10.2*  HCT 35.0* 34.1* 34.2* 31.8* 33.2*  MCV 84.7 87.7 89.1 85.7 85.1  PLT 173 180 180 191 202   Basic Metabolic Panel: Recent Labs  Lab 11/08/23 0505 11/09/23 1258 11/10/23 0539 11/11/23 0646 11/12/23 0507  NA 139 138 138 136 136  K 4.5 3.9 4.1 4.3 4.1  CL 105 103 104 104 103  CO2 24 26 24 26 25   GLUCOSE 144* 88 108* 85 99  BUN 25* 42* 30* 21* 18  CREATININE 1.77* 2.03* 1.26* 1.09* 0.87  CALCIUM  8.9 8.1* 8.2* 8.4* 8.5*   GFR: Estimated Creatinine Clearance: 137.2 mL/min (by C-G formula based on SCr of 0.87 mg/dL). Liver Function Tests: Recent Labs  Lab 11/09/23 1258 11/10/23 0539 11/11/23 0646 11/12/23 0507  AST 12* 10* 66* 49*  ALT 12 10 47* 37  ALKPHOS 77 73 135* 179*  BILITOT 0.3 0.4 0.3 0.3  PROT 6.4* 6.2* 6.3* 6.2*  ALBUMIN  2.5* 2.5* 2.4* 2.4*   No results for input(s): LIPASE, AMYLASE in the last 168 hours. No results for input(s): AMMONIA  in the last 168 hours. Coagulation Profile: No results for input(s): INR, PROTIME in the last 168 hours. Cardiac Enzymes: No results for input(s): CKTOTAL, CKMB, CKMBINDEX, TROPONINI in the last 168 hours. BNP (last 3 results) No results for input(s): PROBNP in the last 8760 hours. HbA1C: No results for input(s): HGBA1C in the last 72 hours. CBG: No results for input(s): GLUCAP in the last 168 hours. Lipid Profile: No results for input(s): CHOL, HDL, LDLCALC, TRIG, CHOLHDL, LDLDIRECT in the last 72 hours. Thyroid Function Tests: No results for input(s): TSH, T4TOTAL, FREET4, T3FREE, THYROIDAB in the last 72 hours. Anemia Panel: No results for input(s): VITAMINB12, FOLATE, FERRITIN, TIBC, IRON, RETICCTPCT in the last 72 hours. Sepsis Labs: No results for input(s): PROCALCITON, LATICACIDVEN in the  last 168 hours.  Recent Results (from the past 240 hours)  Urine Culture     Status: Abnormal   Collection Time: 11/07/23  8:30 AM   Specimen: Urine, Clean Catch  Result Value Ref Range Status   Specimen Description   Final    URINE, CLEAN CATCH Performed at [facility], [address] [place], [place] [address]    Special Requests   Final    NONE Performed at [facility], [address], [place], [place] [address]    Culture >=100,000 COLONIES/mL ESCHERICHIA COLI (A)  Final   Report Status 11/09/2023 FINAL  Final   Organism ID, Bacteria ESCHERICHIA COLI (A)  Final      Susceptibility   Escherichia coli - MIC*    AMPICILLIN <=2 SENSITIVE Sensitive     CEFAZOLIN  <=4 SENSITIVE Sensitive     CEFEPIME <=0.12 SENSITIVE Sensitive     CEFTRIAXONE <=0.25 SENSITIVE Sensitive     CIPROFLOXACIN >=4 RESISTANT Resistant     GENTAMICIN <=1 SENSITIVE Sensitive     IMIPENEM <=0.25 SENSITIVE Sensitive     NITROFURANTOIN  <=16 SENSITIVE Sensitive     TRIMETH /SULFA  <=20 SENSITIVE Sensitive     AMPICILLIN/SULBACTAM <=2 SENSITIVE Sensitive      PIP/TAZO <=4 SENSITIVE Sensitive ug/mL    * >=100,000 COLONIES/mL ESCHERICHIA COLI  Culture, blood (Routine X 2) w Reflex to ID Panel     Status: None   Collection Time: 11/07/23  6:22 PM   Specimen: BLOOD RIGHT HAND  Result Value Ref Range Status   Specimen Description   Final    BLOOD RIGHT HAND Performed at [facility], [address], [place], [place] [address]    Special Requests   Final    BOTTLES DRAWN AEROBIC ONLY Blood Culture results may not be optimal due to an inadequate volume of blood received in culture bottles Performed at [facility], [address] [place], [place] [address]    Culture   Final    NO GROWTH 5 DAYS Performed at [facility], [address] [place], [place] [address]    Report Status 11/12/2023 FINAL  Final  Culture, blood (Routine X 2) w Reflex to ID Panel     Status: None   Collection Time: 11/07/23  9:27 PM   Specimen: BLOOD RIGHT ARM  Result Value Ref Range Status   Specimen Description   Final    BLOOD RIGHT ARM Performed at [facility], [address], [place], [place] [address]    Special Requests   Final    BOTTLES DRAWN AEROBIC ONLY Blood Culture results may not be optimal due to an inadequate volume of blood received in culture bottles Performed at [facility], [address] [place], [place] [address]    Culture   Final    NO GROWTH 5 DAYS Performed at [facility], [address] [place], [place] [address]    Report Status 11/12/2023 FINAL  Final     Radiology Studies: No results found.   Scheduled Meds:  allopurinol   100 mg Oral BID   ALPRAZolam   0.5 mg Oral BID   amLODipine   5 mg Oral Daily   atorvastatin   40 mg Oral QPM   enoxaparin  (LOVENOX ) injection  40 mg Subcutaneous Q24H   gabapentin   300 mg Oral Daily   loratadine   10 mg Oral QPM   metoprolol  succinate  12.5 mg Oral BID   oxyCODONE   10 mg  Oral QID   polyethylene glycol  17 g Oral  BID   senna-docusate  3 tablet Oral QHS   Continuous Infusions:  sodium chloride  Stopped (11/11/23 2150)    ceFAZolin  (ANCEF ) IV 2 g (11/12/23 0456)     LOS: 5 days   Almarie KANDICE Hoots, MD 11/12/2023, 12:13 PM

## 2023-11-12 NOTE — Progress Notes (Signed)
   5 Days Post-Op Subjective: Tracie Green has been doing well for the past several days and is nearing discharge.  She continues to have some stent colic and intermittent bleeding with stable hemoglobin.  Objective: Vital signs in last 24 hours: Temp:  [97.5 F (36.4 C)-98.5 F (36.9 C)] 98.5 F (36.9 C) (08/11 0415) Pulse Rate:  [80-91] 80 (08/11 0415) Resp:  [18-20] 20 (08/11 0415) BP: (115-159)/(60-94) 125/60 (08/11 0415) SpO2:  [97 %-100 %] 97 % (08/11 0415)  Assessment/Plan: 56Y female with sepsis 2/2 obstructing left ureteral stone.  # Left ureteral stone # Sepsis 2/2 urinary source # AKI  CT A/P notes 6.5 mm obstructing left ureteral stone  Resolution of leukocytosis and AKI over the last few days.  Receiving Ancef .  Ongoing renal colic.  Hyoscyamine  provided.  Patient reports intermittent hematuria.  Discussed that this will continue to come and go until the stent is removed and lithotripsy completed.  Interval improvement in hemoglobin.  Definitive stone management on an outpatient basis in the next 3 to 4 weeks, after patient is cleared her infection.  She has had a stent in the past and understands the process going forward.  Ok to discharge from Urologic perspective  Intake/Output from previous day: 08/10 0701 - 08/11 0700 In: 1110.1 [P.O.:600; I.V.:310.1; IV Piggyback:200] Out: 3150 [Urine:3150]  Intake/Output this shift: Total I/O In: 100 [P.O.:100] Out: 200 [Urine:200]  Physical Exam:  General: Alert and oriented CV: No cyanosis Lungs: equal chest rise Abdomen: Soft, NTND, no rebound or guarding   Lab Results: Recent Labs    11/10/23 0539 11/11/23 0646 11/12/23 0507  HGB 10.1* 9.8* 10.2*  HCT 34.2* 31.8* 33.2*   BMET Recent Labs    11/11/23 0646 11/12/23 0507  NA 136 136  K 4.3 4.1  CL 104 103  CO2 26 25  GLUCOSE 85 99  BUN 21* 18  CREATININE 1.09* 0.87  CALCIUM  8.4* 8.5*  HGB 9.8* 10.2*  WBC 6.3 6.6     Studies/Results: No  results found.     LOS: 5 days   Ole Bourdon, NP [facility] Pager: [phone]  11/12/2023, 10:25 AM

## 2023-11-13 DIAGNOSIS — N200 Calculus of kidney: Secondary | ICD-10-CM | POA: Diagnosis not present

## 2023-11-13 MED ORDER — HYOSCYAMINE SULFATE 0.125 MG PO TBDP
0.1250 mg | ORAL_TABLET | Freq: Three times a day (TID) | ORAL | Status: DC
  Administered 2023-11-13 (×4): 0.125 mg via SUBLINGUAL
  Filled 2023-11-13 (×4): qty 1

## 2023-11-13 NOTE — Progress Notes (Signed)
 PROGRESS NOTE    Tracie Green  FMW:[id number] DOB: Oct 21, 1975 DOA: 11/07/2023 PCP: Tobie Emmy Gully, PA-C   Brief Narrative: 48 y.o. female with medical history significant of nephrolithiasis, previous UTI with ESBL E. coli, hypertension, hyperlipidemia, depression, chronic pain syndrome on pain management, GERD who woke up today morning about 4 AM with severe left-sided chest pain, 10 out of 10, radiating to the left shoulder, ultimately settled to the left flank.  She had few episodes of vomiting associated with it.  Did not have any fever at home but she had temperature 100.3 at the ER.  She initially took some antacids without improvement so her daughter brought her to the emergency room.  Denies any change in urination.  Most recently treated for nephrolithiasis a year ago, ESBL E. coli.  Patient denies any recent changes in medications. ED Course: Patient arrived to [facility].  Temperature 100.3, heart rate 130, WBC count normal.  Creatinine 1.29.  Patient underwent CT angiogram of the chest abdomen pelvis, negative for dissection.  She has 6.5 mm UPJ stone.  Urine abnormal.  Patient was given multiple doses of pain medications, meropenem , 3 L of IV fluids and [facility]. On my exam at preop, patient had controlled symptoms.  She had remaining mild pain but denies any other complaints.  She is aware about cystoscopy procedures as she had this before.  Assessment & Plan:   Principal Problem:   Left nephrolithiasis Active Problems:   Essential hypertension   Recurrent UTI   GERD (gastroesophageal reflux disease)   Urolithiasis   Ureteral stone   1.  Sepsis secondary to left-sided obstructive nephrolithiasis with infected stone, acute UTI present on admission: Patient admitted with tachypnea tachycardia leukocytosis and low-grade fever.  UA was consistent with UTI.  Urine cultures E. coli sensitive to all but ciprofloxacin.  Blood  cultures no growth so far.  Antibiotics changed to Ancef  on 11/09/2023.  Patient is status post cystoscopy and ureteroscopy with left ureteral stent insertion.  She had some complaints of hematuria, I try to explain to her some of the hematuria can be normal after stent placed.  Hemoglobin remained stable.  She also is having her menstrual period. AGAIN She feels like she is not ready to go home today but she has agreed to go home tomorrow Still requiring Dilaudid  IV.   AMBULATE TID ENCOURAGE OOB   2 AKI creatinine this has resolved with IV fluids.    2.  Essential hypertension: Continue metoprolol  12.5 twice daily restart Norvasc .  Cozaar  still on hold.  Blood pressure soft to low normal.    3.  GERD: On PPI.  Continue.   4.  Chronic pain syndrome: Continue oxycodone  Zanaflex  and as needed Dilaudid    5.  Anxiety and insomnia: Restarted Xanax   6.  Morbid obesity with BMI 56.  Denies history of sleep apnea.  She will benefit with weight loss and outpatient follow-up.    Estimated body mass index is 56.12 kg/m as calculated from the following:   Height as of this encounter: 5' 9 (1.753 m).   Weight as of this encounter: 172.4 kg.  DVT prophylaxis: lovenox  Code Status: full Family Communication: none Disposition Plan:  Status is: Inpatient Remains inpatient appropriate because: acute illness   Consultants:  Urology  Procedures: Status post cystoscopy  Antimicrobials: Ancef  Subjective:  Still complaining of 8 out of 10 left sided abdominal pain requiring Dilaudid  she does not think she can  go home today either she wants to stay another night  I have discussed with her that I will plan on discharging her home tomorrow  Objective: Vitals:   11/12/23 2014 11/12/23 2018 11/12/23 2332 11/13/23 0419  BP: 96/83 (!) 150/90 (!) 145/76 125/72  Pulse: 78  77 75  Resp: 19  20 18   Temp: 97.8 F (36.6 C)  97.7 F (36.5 C) 98.2 F (36.8 C)  TempSrc: Oral  Oral Oral  SpO2: 99%  97%  100%  Weight:      Height:        Intake/Output Summary (Last 24 hours) at 11/13/2023 1125 Last data filed at 11/13/2023 0700 Gross per 24 hour  Intake 740 ml  Output 1150 ml  Net -410 ml   Filed Weights   11/07/23 0636  Weight: (!) 172.4 kg    Examination:  General exam: Appears in mild distress due to abdominal pain  respiratory system: Clear to auscultation. Respiratory effort normal. Cardiovascular system: S1 & S2 heard, RRR. No JVD, murmurs, rubs, gallops or clicks. No pedal edema. Gastrointestinal system: Abdomen is nondistended, soft left sided abdominal tenderness  Central nervous system: Alert and oriented. No focal neurological deficits. Extremities: Trace edema   Data Reviewed: I have personally reviewed following labs and imaging studies  CBC: Recent Labs  Lab 11/08/23 0505 11/09/23 1258 11/10/23 0539 11/11/23 0646 11/12/23 0507  WBC 30.8* 19.3* 8.6 6.3 6.6  NEUTROABS 28.3*  --   --   --   --   HGB 11.0* 10.5* 10.1* 9.8* 10.2*  HCT 35.0* 34.1* 34.2* 31.8* 33.2*  MCV 84.7 87.7 89.1 85.7 85.1  PLT 173 180 180 191 202   Basic Metabolic Panel: Recent Labs  Lab 11/08/23 0505 11/09/23 1258 11/10/23 0539 11/11/23 0646 11/12/23 0507  NA 139 138 138 136 136  K 4.5 3.9 4.1 4.3 4.1  CL 105 103 104 104 103  CO2 24 26 24 26 25   GLUCOSE 144* 88 108* 85 99  BUN 25* 42* 30* 21* 18  CREATININE 1.77* 2.03* 1.26* 1.09* 0.87  CALCIUM  8.9 8.1* 8.2* 8.4* 8.5*   GFR: Estimated Creatinine Clearance: 137.2 mL/min (by C-G formula based on SCr of 0.87 mg/dL). Liver Function Tests: Recent Labs  Lab 11/09/23 1258 11/10/23 0539 11/11/23 0646 11/12/23 0507  AST 12* 10* 66* 49*  ALT 12 10 47* 37  ALKPHOS 77 73 135* 179*  BILITOT 0.3 0.4 0.3 0.3  PROT 6.4* 6.2* 6.3* 6.2*  ALBUMIN  2.5* 2.5* 2.4* 2.4*   No results for input(s): LIPASE, AMYLASE in the last 168 hours. No results for input(s): AMMONIA in the last 168 hours. Coagulation Profile: No results for  input(s): INR, PROTIME in the last 168 hours. Cardiac Enzymes: No results for input(s): CKTOTAL, CKMB, CKMBINDEX, TROPONINI in the last 168 hours. BNP (last 3 results) No results for input(s): PROBNP in the last 8760 hours. HbA1C: No results for input(s): HGBA1C in the last 72 hours. CBG: No results for input(s): GLUCAP in the last 168 hours. Lipid Profile: No results for input(s): CHOL, HDL, LDLCALC, TRIG, CHOLHDL, LDLDIRECT in the last 72 hours. Thyroid Function Tests: No results for input(s): TSH, T4TOTAL, FREET4, T3FREE, THYROIDAB in the last 72 hours. Anemia Panel: No results for input(s): VITAMINB12, FOLATE, FERRITIN, TIBC, IRON, RETICCTPCT in the last 72 hours. Sepsis Labs: No results for input(s): PROCALCITON, LATICACIDVEN in the last 168 hours.  Recent Results (from the past 240 hours)  Urine Culture     Status: Abnormal  Collection Time: 11/07/23  8:30 AM   Specimen: Urine, Clean Catch  Result Value Ref Range Status   Specimen Description   Final    URINE, CLEAN CATCH Performed at [facility], [address], [place], [place] [address]    Special Requests   Final    NONE Performed at [facility], [address], [place], [place] [address]    Culture >=100,000 COLONIES/mL ESCHERICHIA COLI (A)  Final   Report Status 11/09/2023 FINAL  Final   Organism ID, Bacteria ESCHERICHIA COLI (A)  Final      Susceptibility   Escherichia coli - MIC*    AMPICILLIN <=2 SENSITIVE Sensitive     CEFAZOLIN  <=4 SENSITIVE Sensitive     CEFEPIME <=0.12 SENSITIVE Sensitive     CEFTRIAXONE <=0.25 SENSITIVE Sensitive     CIPROFLOXACIN >=4 RESISTANT Resistant     GENTAMICIN <=1 SENSITIVE Sensitive     IMIPENEM <=0.25 SENSITIVE Sensitive     NITROFURANTOIN  <=16 SENSITIVE Sensitive     TRIMETH /SULFA  <=20 SENSITIVE Sensitive     AMPICILLIN/SULBACTAM <=2 SENSITIVE Sensitive     PIP/TAZO <=4 SENSITIVE Sensitive ug/mL    * >=100,000  COLONIES/mL ESCHERICHIA COLI  Culture, blood (Routine X 2) w Reflex to ID Panel     Status: None   Collection Time: 11/07/23  6:22 PM   Specimen: BLOOD RIGHT HAND  Result Value Ref Range Status   Specimen Description   Final    BLOOD RIGHT HAND Performed at [facility], [address], [place], [place] [address]    Special Requests   Final    BOTTLES DRAWN AEROBIC ONLY Blood Culture results may not be optimal due to an inadequate volume of blood received in culture bottles Performed at [facility], [address] [place], [place] [address]    Culture   Final    NO GROWTH 5 DAYS Performed at [facility], [address] [place], [place] [address]    Report Status 11/12/2023 FINAL  Final  Culture, blood (Routine X 2) w Reflex to ID Panel     Status: None   Collection Time: 11/07/23  9:27 PM   Specimen: BLOOD RIGHT ARM  Result Value Ref Range Status   Specimen Description   Final    BLOOD RIGHT ARM Performed at [facility], [address] [place], [place] [address]    Special Requests   Final    BOTTLES DRAWN AEROBIC ONLY Blood Culture results may not be optimal due to an inadequate volume of blood received in culture bottles Performed at [facility], [address], [place], [place] [address]    Culture   Final    NO GROWTH 5 DAYS Performed at [facility], [address], [place], [place] [address]    Report Status 11/12/2023 FINAL  Final     Radiology Studies: No results found.   Scheduled Meds:  allopurinol   100 mg Oral BID   ALPRAZolam   0.5 mg Oral BID   amLODipine   5 mg Oral Daily   atorvastatin   40 mg Oral QPM   enoxaparin  (LOVENOX ) injection  40 mg Subcutaneous Q24H   gabapentin   300 mg Oral Daily   loratadine   10 mg Oral QPM   metoprolol  succinate  12.5 mg Oral BID   oxyCODONE   10 mg Oral QID   polyethylene glycol  17 g Oral BID   senna-docusate  3 tablet Oral QHS   Continuous  Infusions:  sodium chloride  Stopped (11/11/23 2150)    ceFAZolin  (ANCEF ) IV 2 g (11/13/23 0554)     LOS: 6 days   Almarie KANDICE Hoots, MD 11/13/2023, 11:25 AM

## 2023-11-14 ENCOUNTER — Telehealth (HOSPITAL_COMMUNITY): Payer: Self-pay | Admitting: Pharmacy Technician

## 2023-11-14 DIAGNOSIS — N200 Calculus of kidney: Secondary | ICD-10-CM | POA: Diagnosis not present

## 2023-11-14 MED ORDER — CEPHALEXIN 500 MG PO CAPS: 500.0000 mg | ORAL_CAPSULE | Freq: Two times a day (BID) | ORAL | 0 refills | Status: DC

## 2023-11-14 MED ORDER — HYOSCYAMINE SULFATE 0.125 MG SL SUBL: 0.1250 mg | SUBLINGUAL_TABLET | Freq: Three times a day (TID) | SUBLINGUAL | 0 refills | Status: AC

## 2023-11-14 MED ORDER — POLYETHYLENE GLYCOL 3350 17 G PO PACK: 17.0000 g | PACK | Freq: Two times a day (BID) | ORAL | 0 refills | Status: AC

## 2023-11-14 MED ORDER — HYOSCYAMINE SULFATE 0.125 MG SL SUBL
0.1250 mg | SUBLINGUAL_TABLET | Freq: Three times a day (TID) | SUBLINGUAL | Status: DC
  Administered 2023-11-14 (×2): 0.125 mg via SUBLINGUAL
  Filled 2023-11-14: qty 1

## 2023-11-14 MED ORDER — SENNOSIDES-DOCUSATE SODIUM 8.6-50 MG PO TABS: 3.0000 | ORAL_TABLET | Freq: Every day | ORAL | Status: AC

## 2023-11-14 MED ORDER — OXYCODONE HCL 10 MG PO TABS: 10.0000 mg | ORAL_TABLET | Freq: Four times a day (QID) | ORAL | 0 refills | Status: AC

## 2023-11-14 MED ORDER — METOPROLOL SUCCINATE ER 25 MG PO TB24: 12.5000 mg | ORAL_TABLET | Freq: Two times a day (BID) | ORAL | 6 refills | Status: DC

## 2023-11-14 MED ORDER — METOPROLOL SUCCINATE ER 25 MG PO TB24: 12.5000 mg | ORAL_TABLET | Freq: Every day | ORAL | 6 refills | Status: AC

## 2023-11-14 MED ORDER — CEPHALEXIN 500 MG PO CAPS
500.0000 mg | ORAL_CAPSULE | Freq: Three times a day (TID) | ORAL | Status: DC
  Administered 2023-11-14 (×2): 500 mg via ORAL
  Filled 2023-11-14: qty 1

## 2023-11-14 MED ORDER — ATORVASTATIN CALCIUM 40 MG PO TABS: 40.0000 mg | ORAL_TABLET | Freq: Every day | ORAL | 1 refills | Status: AC

## 2023-11-14 NOTE — Plan of Care (Signed)
  Problem: Clinical Measurements: Goal: Diagnostic test results will improve Outcome: Progressing Goal: Respiratory complications will improve Outcome: Progressing Goal: Cardiovascular complication will be avoided Outcome: Progressing   Problem: Elimination: Goal: Will not experience complications related to bowel motility Outcome: Progressing Goal: Will not experience complications related to urinary retention Outcome: Progressing   Problem: Pain Managment: Goal: General experience of comfort will improve and/or be controlled Outcome: Progressing

## 2023-11-14 NOTE — Progress Notes (Signed)
 AVS reviewed w/ pt who verbalized an understanding. PIV removed as noted. Pt to follow up w/ PCP in [facility] - has an appt this afternoon. Pt assisted to bathroom - given toiletries- Pt verbalized an understanding to use call bell when doen.  Pt's daughter is bringing her clothes to the hospital. Primary nurse updated by this RN

## 2023-11-14 NOTE — Discharge Summary (Signed)
 Physician Discharge Summary  Tracie Green FMW:[id number] DOB: 1975-07-20 DOA: 11/07/2023  PCP: Tobie Emmy Gully, PA-C  Admit date: 11/07/2023 Discharge date: 11/14/2023  Admitted From: Home Disposition Home Recommendations for Outpatient Follow-up:  Follow up with PCP in 1-2 weeks Please obtain BMP/CBC in one week Please follow up with urology  Home Health: None Equipment/Devices: None Discharge Condition: Stable CODE STATUS: Full code Diet recommendation: Cardiac  Brief/Interim Summary:  48 y.o. female with medical history significant of nephrolithiasis, previous UTI with ESBL E. coli, hypertension, hyperlipidemia, depression, chronic pain syndrome on pain management, GERD who woke up today morning about 4 AM with severe left-sided chest pain, 10 out of 10, radiating to the left shoulder, ultimately settled to the left flank.  She had few episodes of vomiting associated with it.  Did not have any fever at home but she had temperature 100.3 at the ER.  She initially took some antacids without improvement so her daughter brought her to the emergency room.  Denies any change in urination.  Most recently treated for nephrolithiasis a year ago, ESBL E. coli.  Patient denies any recent changes in medications. ED Course: Patient arrived to [facility].  Temperature 100.3, heart rate 130, WBC count normal.  Creatinine 1.29.  Patient underwent CT angiogram of the chest abdomen pelvis, negative for dissection.  She has 6.5 mm UPJ stone.  Urine abnormal.  Patient was given multiple doses of pain medications, meropenem , 3 L of IV fluids and [facility].   Discharge Diagnoses:  Principal Problem:   Left nephrolithiasis Active Problems:   Essential hypertension   Recurrent UTI   GERD (gastroesophageal reflux disease)   Urolithiasis   Ureteral stone  1.  Sepsis secondary to left-sided obstructive nephrolithiasis with infected stone, acute UTI  present on admission: Patient admitted with tachypnea tachycardia leukocytosis and low-grade fever.  UA was consistent with UTI.  Urine cultures E. coli sensitive to all but ciprofloxacin.  Blood cultures no growth so far.  Antibiotics changed to Ancef  on 11/09/2023.  Patient is status post cystoscopy and ureteroscopy with left ureteral stent insertion.  She had some complaints of hematuria, I try to explain to her some of the hematuria can be normal after stent placed.  Hemoglobin remained stable.  She was discharged on Keflex  500 mg 2 times a day for 10 days.   2 AKI  this has resolved with IV fluids and after stent placement as above.   2.  Essential hypertension: Continue metoprolol  12.5 twice daily   3.  GERD: On PPI.  Continue.   4.  Chronic pain syndrome: Continue oxycodone     5.  Anxiety and insomnia: Continue Xanax    6.  Morbid obesity with BMI 56.  Denies history of sleep apnea.  She will benefit with weight loss and outpatient follow-up.    Estimated body mass index is 56.12 kg/m as calculated from the following:   Height as of this encounter: 5' 9 (1.753 m).   Weight as of this encounter: 172.4 kg.  Discharge Instructions  Discharge Instructions     Diet - low sodium heart healthy   Complete by: As directed    Diet - low sodium heart healthy   Complete by: As directed    Increase activity slowly   Complete by: As directed    Increase activity slowly   Complete by: As directed    No wound care   Complete by: As directed  No wound care   Complete by: As directed       Allergies as of 11/14/2023       Reactions   Tramadol  Itching   edema   Penicillins Hives, Rash   Hives   Morphine  Nausea And Vomiting   Morphine  And Codeine  Nausea And Vomiting        Medication List     STOP taking these medications    amLODipine  5 MG tablet Commonly known as: NORVASC    fluconazole 200 MG tablet Commonly known as: DIFLUCAN   fluticasone  50 MCG/ACT nasal  spray Commonly known as: FLONASE    furosemide 40 MG tablet Commonly known as: LASIX   losartan  100 MG tablet Commonly known as: COZAAR    oxyCODONE -acetaminophen  5-325 MG tablet Commonly known as: PERCOCET/ROXICET   Vitamin D  (Ergocalciferol ) 1.25 MG (50000 UNIT) Caps capsule Commonly known as: DRISDOL       TAKE these medications    albuterol  108 (90 Base) MCG/ACT inhaler Commonly known as: VENTOLIN  HFA Inhale 2 puffs into the lungs every 4 (four) hours as needed.   allopurinol  100 MG tablet Commonly known as: ZYLOPRIM  Take 100 mg by mouth 2 (two) times daily.   ALPRAZolam  0.5 MG tablet Commonly known as: XANAX  Take 0.5 mg by mouth 2 (two) times daily.   aspirin -acetaminophen -caffeine  250-250-65 MG tablet Commonly known as: EXCEDRIN  MIGRAINE Take 1 tablet by mouth daily as needed for headache or migraine.   atorvastatin  40 MG tablet Commonly known as: LIPITOR Take 1 tablet (40 mg total) by mouth daily.   cephALEXin  500 MG capsule Commonly known as: KEFLEX  Take 1 capsule (500 mg total) by mouth 2 (two) times daily. Notes to patient: Take with food   cyanocobalamin 1000 MCG/ML injection Commonly known as: VITAMIN B12 Inject 1,000 mcg into the muscle every 30 (thirty) days.   famotidine  20 MG tablet Commonly known as: PEPCID  Take 1 tablet (20 mg total) by mouth daily for 30 doses.   gabapentin  300 MG capsule Commonly known as: NEURONTIN  Take 300 mg by mouth daily as needed (nerve pain).   hyoscyamine  0.125 MG SL tablet Commonly known as: LEVSIN  SL Place 1 tablet (0.125 mg total) under the tongue 3 (three) times daily.   levocetirizine 5 MG tablet Commonly known as: XYZAL  Take 1 tablet (5 mg total) by mouth every evening. What changed: when to take this   metoprolol  succinate 25 MG 24 hr tablet Commonly known as: TOPROL -XL Take 0.5 tablets (12.5 mg total) by mouth daily.   naloxone 4 MG/0.1ML Liqd nasal spray kit Commonly known as: NARCAN Place 0.4  mg into the nose as needed (od).   ondansetron  4 MG disintegrating tablet Commonly known as: ZOFRAN -ODT Take 1 tablet (4 mg total) by mouth every 6 (six) hours as needed for nausea or vomiting.   Oxycodone  HCl 10 MG Tabs Take 1 tablet (10 mg total) by mouth 4 (four) times daily for 5 days.   polyethylene glycol 17 g packet Commonly known as: MIRALAX  / GLYCOLAX  Take 17 g by mouth 2 (two) times daily.   senna-docusate 8.6-50 MG tablet Commonly known as: Senokot-S Take 3 tablets by mouth at bedtime.   tiZANidine  4 MG tablet Commonly known as: ZANAFLEX  TAKE 1 TABLET BY MOUTH EVERY 6 HOURS AS NEEDED FOR MUSCLE SPASMS. What changed:  how much to take when to take this   Ubrelvy  100 MG Tabs Generic drug: Ubrogepant  Take 100 mg by mouth as needed (migraines).  Follow-up Information     Tobie Richmond Hasmukh, PA-C Follow up.   Specialty: Family Medicine Contact information: [address] [facility] [place] [address] [phone]         Gaston Hamilton, MD Follow up.   Specialty: Urology Contact information: [address] [place] [address] [phone]                Allergies  Allergen Reactions   Tramadol  Itching    edema   Penicillins Hives and Rash    Hives   Morphine  Nausea And Vomiting   Morphine  And Codeine  Nausea And Vomiting    Consultations:urology  Procedures/Studies: DG C-Arm 1-60 Min-No Report Result Date: 11/07/2023 Fluoroscopy was utilized by the requesting physician.  No radiographic interpretation.   CT ANGIO CHEST/ABD/PEL FOR DISSECTION W &/OR WO CONTRAST Result Date: 11/07/2023 CLINICAL DATA:  Left chest pain radiating to left arm EXAM: CT ANGIOGRAPHY CHEST, ABDOMEN AND PELVIS TECHNIQUE: Non-contrast CT of the chest was initially obtained. Multidetector CT imaging through the chest, abdomen and pelvis was performed using the standard protocol during bolus administration of intravenous  contrast. Multiplanar reconstructed images and MIPs were obtained and reviewed to evaluate the vascular anatomy. RADIATION DOSE REDUCTION: This exam was performed according to the departmental dose-optimization program which includes automated exposure control, adjustment of the mA and/or kV according to patient size and/or use of iterative reconstruction technique. CONTRAST:  OMNIPAQUE  IOHEXOL  350 MG/ML SOLN COMPARISON:  CT chest abdomen pelvis September 09, 2023 FINDINGS: CTA CHEST FINDINGS Cardiovascular: Satisfactory opacification of the pulmonary arteries to the segmental level. No evidence of pulmonary embolism. Normal heart size. No pericardial effusion. Mediastinum/Nodes: No enlarged mediastinal, hilar, or axillary lymph nodes. Thyroid gland, trachea, demonstrate no significant findings. Hiatal hernia and mildly the mildly patulous esophagus. Lungs/Pleura: Lungs are clear. No pleural effusion or pneumothorax. Musculoskeletal: The no chest wall abnormality. No acute or significant osseous findings. Review of the MIP images confirms the above findings. CTA ABDOMEN AND PELVIS FINDINGS VASCULAR Aorta: Normal caliber aorta without aneurysm, dissection, vasculitis or significant stenosis. Celiac: Patent without evidence of aneurysm, dissection, vasculitis or significant stenosis. SMA: Patent without evidence of aneurysm, dissection, vasculitis or significant stenosis. Renals: Both renal arteries are patent without evidence of aneurysm, dissection, vasculitis, fibromuscular dysplasia or significant stenosis. IMA: Patent without evidence of aneurysm, dissection, vasculitis or significant stenosis. Inflow: Patent without evidence of aneurysm, dissection, vasculitis or significant stenosis. Veins: No obvious venous abnormality within the limitations of this arterial phase study. Review of the MIP images confirms the above findings. NON-VASCULAR Hepatobiliary: Hepatomegaly with hepatic steatosis. Liver measures 21 cm  in craniocaudal dimension. No suspicious enhancing lesion. Status post cholecystectomy with dilation of the common bile duct (reservoir phenomenon. Pancreas: Unremarkable. No pancreatic ductal dilatation or surrounding inflammatory changes. Spleen: Normal in size without focal abnormality. The Adrenals/Urinary Tract: Adrenal glands are unremarkable. There is a partially obstructive nephrolithiasis in proximal left ureter about 4 cm distal to UPJ measuring 6.5 mm with mild fullness of the left renal pelvis and collecting system proximal to the stone and mild peri ureteral fat stranding (image [id number]) likely due to infectious/inflammatory changes. This stone was previously located in left kidney lower calyx and is now moved to proximal ureter. Additional nonobstructive nephrolithiasis in left kidney upper pole measuring 3 mm. Subcentimeter simple renal cortical cyst in left kidney interpolar region. Which does not require imaging follow-up. Bladder is thick-walled thick wall and under distended. Stomach/Bowel: Small hiatal hernia. Fat containing small  stomach is within normal limits. Appendix appears normal. No evidence of bowel wall thickening, distention, or inflammatory changes. Lymphatic: No suspicious lymphadenopathy Reproductive: Heterogeneous lobulated uterus.  No adnexal mass. Other: Tiny fat containing umbilical hernia with a defect measuring 11 mm. No abdominopelvic ascites. Musculoskeletal: No acute or significant osseous findings. Degenerative changes of the spine. Review of the MIP images confirms the above findings. IMPRESSION: Proximal left ureter partially obstructive calculus with mild fullness of the left collecting system and peri ureteral fat stranding. Additional punctate nonobstructive nephrolithiasis in left kidney upper pole. No suspicious vascular lesion . Hepatomegaly and hepatic steatosis. Cholecystectomy with mild biliary dilatation (reservoir phenomenon). Electronically Signed   By: Megan   Zare M.D.   On: 11/07/2023 12:50   DG Chest 2 View Result Date: 11/07/2023 CLINICAL DATA:  Chest pain with nausea. EXAM: CHEST - 2 VIEW COMPARISON:  05/17/2023 FINDINGS: The lungs are clear without focal pneumonia, edema, pneumothorax or pleural effusion. The cardiopericardial silhouette is within normal limits for size. No acute bony abnormality. Telemetry leads overlie the chest. IMPRESSION: No active cardiopulmonary disease. Electronically Signed   By: Camellia Candle M.D.   On: 11/07/2023 07:14   (Echo, Carotid, EGD, Colonoscopy, ERCP)    Subjective: Awake alert pain improved  Discharge Exam: Vitals:   11/14/23 1007 11/14/23 1401  BP: (!) 143/99 (!) 148/66  Pulse: 75 87  Resp:  14  Temp:  98.6 F (37 C)  SpO2: 99% 100%   Vitals:   11/14/23 0538 11/14/23 1002 11/14/23 1007 11/14/23 1401  BP: (!) 102/54 (!) 143/99 (!) 143/99 (!) 148/66  Pulse: 76  75 87  Resp: 17   14  Temp: 97.8 F (36.6 C)   98.6 F (37 C)  TempSrc: Oral   Oral  SpO2: 96%  99% 100%  Weight:      Height:        General: Pt is alert, awake, not in acute distress Cardiovascular: RRR, S1/S2 +, no rubs, no gallops Respiratory: CTA bilaterally, no wheezing, no rhonchi Abdominal: Soft, NT, ND, bowel sounds + Extremities: no edema, no cyanosis    The results of significant diagnostics from this hospitalization (including imaging, microbiology, ancillary and laboratory) are listed below for reference.     Microbiology: Recent Results (from the past 240 hours)  Urine Culture     Status: Abnormal   Collection Time: 11/07/23  8:30 AM   Specimen: Urine, Clean Catch  Result Value Ref Range Status   Specimen Description   Final    URINE, CLEAN CATCH Performed at [facility], [address] [place], [place] [address]    Special Requests   Final    NONE Performed at [facility], [address] [place], [place] [address]    Culture >=100,000 COLONIES/mL ESCHERICHIA COLI (A)  Final   Report Status  11/09/2023 FINAL  Final   Organism ID, Bacteria ESCHERICHIA COLI (A)  Final      Susceptibility   Escherichia coli - MIC*    AMPICILLIN <=2 SENSITIVE Sensitive     CEFAZOLIN  <=4 SENSITIVE Sensitive     CEFEPIME <=0.12 SENSITIVE Sensitive     CEFTRIAXONE <=0.25 SENSITIVE Sensitive     CIPROFLOXACIN >=4 RESISTANT Resistant     GENTAMICIN <=1 SENSITIVE Sensitive     IMIPENEM <=0.25 SENSITIVE Sensitive     NITROFURANTOIN  <=16 SENSITIVE Sensitive     TRIMETH /SULFA  <=20 SENSITIVE Sensitive     AMPICILLIN/SULBACTAM <=2 SENSITIVE Sensitive     PIP/TAZO <=4 SENSITIVE Sensitive ug/mL    * >=  100,000 COLONIES/mL ESCHERICHIA COLI  Culture, blood (Routine X 2) w Reflex to ID Panel     Status: None   Collection Time: 11/07/23  6:22 PM   Specimen: BLOOD RIGHT HAND  Result Value Ref Range Status   Specimen Description   Final    BLOOD RIGHT HAND Performed at [facility], [address] [place], [place] [address]    Special Requests   Final    BOTTLES DRAWN AEROBIC ONLY Blood Culture results may not be optimal due to an inadequate volume of blood received in culture bottles Performed at [facility], [address] [place], [place] [address]    Culture   Final    NO GROWTH 5 DAYS Performed at [facility], [address], [place], [place] [address]    Report Status 11/12/2023 FINAL  Final  Culture, blood (Routine X 2) w Reflex to ID Panel     Status: None   Collection Time: 11/07/23  9:27 PM   Specimen: BLOOD RIGHT ARM  Result Value Ref Range Status   Specimen Description   Final    BLOOD RIGHT ARM Performed at [facility], [address] [place], [place] [address]    Special Requests   Final    BOTTLES DRAWN AEROBIC ONLY Blood Culture results may not be optimal due to an inadequate volume of blood received in culture bottles Performed at [facility], [address], [place], [place] [address]    Culture   Final     NO GROWTH 5 DAYS Performed at [facility], [address], [place], [place] [address]    Report Status 11/12/2023 FINAL  Final     Labs: BNP (last 3 results) No results for input(s): BNP in the last 8760 hours. Basic Metabolic Panel: Recent Labs  Lab 11/08/23 0505 11/09/23 1258 11/10/23 0539 11/11/23 0646 11/12/23 0507  NA 139 138 138 136 136  K 4.5 3.9 4.1 4.3 4.1  CL 105 103 104 104 103  CO2 24 26 24 26 25   GLUCOSE 144* 88 108* 85 99  BUN 25* 42* 30* 21* 18  CREATININE 1.77* 2.03* 1.26* 1.09* 0.87  CALCIUM  8.9 8.1* 8.2* 8.4* 8.5*   Liver Function Tests: Recent Labs  Lab 11/09/23 1258 11/10/23 0539 11/11/23 0646 11/12/23 0507  AST 12* 10* 66* 49*  ALT 12 10 47* 37  ALKPHOS 77 73 135* 179*  BILITOT 0.3 0.4 0.3 0.3  PROT 6.4* 6.2* 6.3* 6.2*  ALBUMIN  2.5* 2.5* 2.4* 2.4*   No results for input(s): LIPASE, AMYLASE in the last 168 hours. No results for input(s): AMMONIA in the last 168 hours. CBC: Recent Labs  Lab 11/08/23 0505 11/09/23 1258 11/10/23 0539 11/11/23 0646 11/12/23 0507  WBC 30.8* 19.3* 8.6 6.3 6.6  NEUTROABS 28.3*  --   --   --   --   HGB 11.0* 10.5* 10.1* 9.8* 10.2*  HCT 35.0* 34.1* 34.2* 31.8* 33.2*  MCV 84.7 87.7 89.1 85.7 85.1  PLT 173 180 180 191 202   Cardiac Enzymes: No results for input(s): CKTOTAL, CKMB, CKMBINDEX, TROPONINI in the last 168 hours. BNP: Invalid input(s): POCBNP CBG: No results for input(s): GLUCAP in the last 168 hours. D-Dimer No results for input(s): DDIMER in the last 72 hours. Hgb A1c No results for input(s): HGBA1C in the last 72 hours. Lipid Profile No results for input(s): CHOL, HDL, LDLCALC, TRIG, CHOLHDL, LDLDIRECT in the last 72 hours.  Thyroid function studies No results for input(s): TSH, T4TOTAL, T3FREE, THYROIDAB in the last 72 hours.  Invalid input(s): FREET3 Anemia work up No results for input(s): VITAMINB12, FOLATE, FERRITIN, TIBC,  IRON, RETICCTPCT in the last 72 hours. Urinalysis    Component Value Date/Time   COLORURINE YELLOW 11/07/2023 0830   APPEARANCEUR HAZY (A) 11/07/2023 0830   LABSPEC 1.015 11/07/2023 0830   PHURINE 6.0 11/07/2023 0830   GLUCOSEU NEGATIVE 11/07/2023 0830   HGBUR NEGATIVE 11/07/2023 0830   BILIRUBINUR NEGATIVE 11/07/2023 0830   BILIRUBINUR negative 07/28/2022 1654   KETONESUR NEGATIVE 11/07/2023 0830   PROTEINUR NEGATIVE 11/07/2023 0830   UROBILINOGEN 0.2 07/28/2022 1654   UROBILINOGEN 0.2 03/19/2013 1647   NITRITE POSITIVE (A) 11/07/2023 0830   LEUKOCYTESUR SMALL (A) 11/07/2023 0830   Sepsis Labs Recent Labs  Lab 11/09/23 1258 11/10/23 0539 11/11/23 0646 11/12/23 0507  WBC 19.3* 8.6 6.3 6.6   Microbiology Recent Results (from the past 240 hours)  Urine Culture     Status: Abnormal   Collection Time: 11/07/23  8:30 AM   Specimen: Urine, Clean Catch  Result Value Ref Range Status   Specimen Description   Final    URINE, CLEAN CATCH Performed at [facility], [address] [place], [place] [address]    Special Requests   Final    NONE Performed at [facility], [address] [place], [place] [address]    Culture >=100,000 COLONIES/mL ESCHERICHIA COLI (A)  Final   Report Status 11/09/2023 FINAL  Final   Organism ID, Bacteria ESCHERICHIA COLI (A)  Final      Susceptibility   Escherichia coli - MIC*    AMPICILLIN <=2 SENSITIVE Sensitive     CEFAZOLIN  <=4 SENSITIVE Sensitive     CEFEPIME <=0.12 SENSITIVE Sensitive     CEFTRIAXONE <=0.25 SENSITIVE Sensitive     CIPROFLOXACIN >=4 RESISTANT Resistant     GENTAMICIN <=1 SENSITIVE Sensitive     IMIPENEM <=0.25 SENSITIVE Sensitive     NITROFURANTOIN  <=16 SENSITIVE Sensitive     TRIMETH /SULFA  <=20 SENSITIVE Sensitive     AMPICILLIN/SULBACTAM <=2 SENSITIVE Sensitive     PIP/TAZO <=4 SENSITIVE Sensitive ug/mL    * >=100,000 COLONIES/mL ESCHERICHIA COLI  Culture, blood (Routine X 2) w Reflex to ID Panel     Status: None    Collection Time: 11/07/23  6:22 PM   Specimen: BLOOD RIGHT HAND  Result Value Ref Range Status   Specimen Description   Final    BLOOD RIGHT HAND Performed at [facility], [address] [place], [place] [address]    Special Requests   Final    BOTTLES DRAWN AEROBIC ONLY Blood Culture results may not be optimal due to an inadequate volume of blood received in culture bottles Performed at [facility], [address], [place], [place] [address]    Culture   Final    NO GROWTH 5 DAYS Performed at [facility], [address] [place], [place] [address]    Report Status 11/12/2023 FINAL  Final  Culture, blood (Routine X 2) w Reflex to ID Panel     Status: None   Collection Time: 11/07/23  9:27 PM   Specimen: BLOOD RIGHT ARM  Result Value Ref Range Status   Specimen Description   Final    BLOOD RIGHT ARM Performed at [facility], [address], [place], [place] [address]    Special Requests   Final    BOTTLES DRAWN  AEROBIC ONLY Blood Culture results may not be optimal due to an inadequate volume of blood received in culture bottles Performed at [facility], [address] [place], [place] [address]    Culture   Final    NO GROWTH 5 DAYS Performed at [facility], [address], [place], [place] [address]    Report Status 11/12/2023 FINAL  Final     Time coordinating discharge: 39 min SIGNED:   Almarie KANDICE Hoots, MD  [facility] 11/14/2023, 4:46 PM

## 2023-11-14 NOTE — Telephone Encounter (Signed)
 Pharmacy Patient Advocate Encounter   Received notification from Fax that prior authorization for Hyoscyamine  Sulfate 0.125MG  sublingual tablets  is required/requested.   Insurance verification completed.   The patient is insured through [employer] [place] .   Per test claim: PA required; PA submitted to above mentioned insurance via Latent Key/confirmation #/EOC [id number] Status is pending

## 2023-11-15 NOTE — Telephone Encounter (Signed)
 Pharmacy Patient Advocate Encounter  Received notification from [facility] that Prior Authorization for Hyoscyamine  Sulfate 0.125MG  sublingual tablets  has been CANCELLED due to This request has been closed by the Patient's Pharmacy Benefit Manager. Call Pharmacy Customer Care at [phone]. PA Not Required.   PA #/Case ID/Reference #: [id number]

## 2023-11-22 ENCOUNTER — Other Ambulatory Visit: Payer: Self-pay

## 2023-11-22 DIAGNOSIS — N200 Calculus of kidney: Secondary | ICD-10-CM

## 2023-11-26 ENCOUNTER — Ambulatory Visit: Payer: MEDICAID | Admitting: Urology

## 2023-11-26 NOTE — Progress Notes (Deleted)
 Assessment: 1. Ureteral calculus, left, proximal, 6 x 3 mm   2. History of UTI      Plan: I personally reviewed the patient's chart including provider notes, lab and imaging results. I personally reviewed the CT study from 11/07/2023 which showed a 3 x 6 mm calculus in the proximal left ureter.   Chief Complaint: No chief complaint on file.   History of Present Illness:  Tracie Green is a 48 y.o. female who is seen in consultation from Glendia Elizabeth, MD for evaluation of left ureteral calculus and UTI. She presented to the emergency room on 11/07/2023 with left flank pain.  She also developed a low-grade fever.  CT imaging showed a 6.5 mm calculus in the left proximal ureter with associated obstruction. She was taken to the operating room on 11/07/2023 where she underwent cystoscopy and insertion of a left ureteral stent. Urine culture grew >100 K E. coli. She was discharged on cephalexin .  Past Medical History:  Past Medical History:  Diagnosis Date   Hypertension    PE (pulmonary embolism)     Past Surgical History:  Past Surgical History:  Procedure Laterality Date   CESAREAN SECTION     CHOLECYSTECTOMY  1998   CYSTOSCOPY WITH STENT PLACEMENT Left 11/07/2023   Procedure: CYSTOSCOPY, WITH STENT INSERTION;  Surgeon: Elizabeth Glendia, MD;  Location: [facility];  Service: Urology;  Laterality: Left;   INCISION AND DRAINAGE      Allergies:  Allergies  Allergen Reactions   Tramadol  Itching    edema   Penicillins Hives and Rash    Hives   Morphine  Nausea And Vomiting   Morphine  And Codeine  Nausea And Vomiting    Family History:  Family History  Problem Relation Age of Onset   Breast cancer Mother 74   Ovarian cancer Mother    BRCA 1/2 Neg Hx     Social History:  Social History   Tobacco Use   Smoking status: Never   Smokeless tobacco: Never  Vaping Use   Vaping status: Never Used  Substance Use Topics   Alcohol use: No   Drug use: No    Review of  symptoms:  Constitutional:  Negative for unexplained weight loss, night sweats, fever, chills ENT:  Negative for nose bleeds, sinus pain, painful swallowing CV:  Negative for chest pain, shortness of breath, exercise intolerance, palpitations, loss of consciousness Resp:  Negative for cough, wheezing, shortness of breath GI:  Negative for nausea, vomiting, diarrhea, bloody stools GU:  Positives noted in HPI; otherwise negative for gross hematuria, dysuria, urinary incontinence Neuro:  Negative for seizures, poor balance, limb weakness, slurred speech Psych:  Negative for lack of energy, depression, anxiety Endocrine:  Negative for polydipsia, polyuria, symptoms of hypoglycemia (dizziness, hunger, sweating) Hematologic:  Negative for anemia, purpura, petechia, prolonged or excessive bleeding, use of anticoagulants  Allergic:  Negative for difficulty breathing or choking as a result of exposure to anything; no shellfish allergy; no allergic response (rash/itch) to materials, foods  Physical exam: LMP 10/14/2023 (Exact Date) Comment: Pregnancy test Negative 11/07/2023 GENERAL APPEARANCE:  Well appearing, well developed, well nourished, NAD HEENT: Atraumatic, Normocephalic, oropharynx clear. NECK: Supple without lymphadenopathy or thyromegaly. LUNGS: Clear to auscultation bilaterally. HEART: Regular Rate and Rhythm without murmurs, gallops, or rubs. ABDOMEN: Soft, non-tender, No Masses. EXTREMITIES: Moves all extremities well.  Without clubbing, cyanosis, or edema. NEUROLOGIC:  Alert and oriented x 3, normal gait, CN II-XII grossly intact.  MENTAL STATUS:  Appropriate. BACK:  Non-tender  to palpation.  No CVAT SKIN:  Warm, dry and intact.    Results: U/A:

## 2023-11-28 ENCOUNTER — Ambulatory Visit: Payer: MEDICAID | Admitting: Urology

## 2023-11-28 NOTE — Progress Notes (Deleted)
 Assessment: 1. Ureteral calculus, left, proximal   2. Nephrolithiasis      Plan: I personally reviewed the patient's chart including provider notes, lab and imaging results. I personally reviewed the CT study from 11/07/2023 which showed a 3 x 6 mm calculus in the proximal left ureter.   Chief Complaint: No chief complaint on file.   History of Present Illness:  Tracie Green is a 48 y.o. female who is seen in consultation from Glendia Elizabeth, MD for evaluation of left ureteral calculus and UTI. She presented to the emergency room on 11/07/2023 with left flank pain.  She also developed a low-grade fever.  CT imaging showed a 6.5 mm calculus in the left proximal ureter with associated obstruction. She was taken to the operating room on 11/07/2023 where she underwent cystoscopy and insertion of a left ureteral stent. Urine culture grew >100 K E. coli. She was discharged on cephalexin .  Past Medical History:  Past Medical History:  Diagnosis Date   Hypertension    PE (pulmonary embolism)     Past Surgical History:  Past Surgical History:  Procedure Laterality Date   CESAREAN SECTION     CHOLECYSTECTOMY  1998   CYSTOSCOPY WITH STENT PLACEMENT Left 11/07/2023   Procedure: CYSTOSCOPY, WITH STENT INSERTION;  Surgeon: Elizabeth Glendia, MD;  Location: [facility];  Service: Urology;  Laterality: Left;   INCISION AND DRAINAGE      Allergies:  Allergies  Allergen Reactions   Tramadol  Itching    edema   Penicillins Hives and Rash    Hives   Morphine  Nausea And Vomiting   Morphine  And Codeine  Nausea And Vomiting    Family History:  Family History  Problem Relation Age of Onset   Breast cancer Mother 91   Ovarian cancer Mother    BRCA 1/2 Neg Hx     Social History:  Social History   Tobacco Use   Smoking status: Never   Smokeless tobacco: Never  Vaping Use   Vaping status: Never Used  Substance Use Topics   Alcohol use: No   Drug use: No    Review of symptoms:   Constitutional:  Negative for unexplained weight loss, night sweats, fever, chills ENT:  Negative for nose bleeds, sinus pain, painful swallowing CV:  Negative for chest pain, shortness of breath, exercise intolerance, palpitations, loss of consciousness Resp:  Negative for cough, wheezing, shortness of breath GI:  Negative for nausea, vomiting, diarrhea, bloody stools GU:  Positives noted in HPI; otherwise negative for gross hematuria, dysuria, urinary incontinence Neuro:  Negative for seizures, poor balance, limb weakness, slurred speech Psych:  Negative for lack of energy, depression, anxiety Endocrine:  Negative for polydipsia, polyuria, symptoms of hypoglycemia (dizziness, hunger, sweating) Hematologic:  Negative for anemia, purpura, petechia, prolonged or excessive bleeding, use of anticoagulants  Allergic:  Negative for difficulty breathing or choking as a result of exposure to anything; no shellfish allergy; no allergic response (rash/itch) to materials, foods  Physical exam: LMP 10/14/2023 (Exact Date) Comment: Pregnancy test Negative 11/07/2023 GENERAL APPEARANCE:  Well appearing, well developed, well nourished, NAD HEENT: Atraumatic, Normocephalic, oropharynx clear. NECK: Supple without lymphadenopathy or thyromegaly. LUNGS: Clear to auscultation bilaterally. HEART: Regular Rate and Rhythm without murmurs, gallops, or rubs. ABDOMEN: Soft, non-tender, No Masses. EXTREMITIES: Moves all extremities well.  Without clubbing, cyanosis, or edema. NEUROLOGIC:  Alert and oriented x 3, normal gait, CN II-XII grossly intact.  MENTAL STATUS:  Appropriate. BACK:  Non-tender to palpation.  No CVAT SKIN:  Warm, dry and intact.    Results: U/A:

## 2023-12-05 ENCOUNTER — Ambulatory Visit (INDEPENDENT_AMBULATORY_CARE_PROVIDER_SITE_OTHER): Payer: MEDICAID | Admitting: Urology

## 2023-12-05 ENCOUNTER — Encounter: Payer: Self-pay | Admitting: Urology

## 2023-12-05 ENCOUNTER — Ambulatory Visit (HOSPITAL_BASED_OUTPATIENT_CLINIC_OR_DEPARTMENT_OTHER)
Admission: RE | Admit: 2023-12-05 | Discharge: 2023-12-05 | Disposition: A | Discharge status: Home / Self Care | Payer: MEDICAID | Source: Ambulatory Visit | Attending: Urology | Admitting: Urology

## 2023-12-05 VITALS — BP 145/90 | HR 90 | Ht 69.0 in | Wt 372.0 lb

## 2023-12-05 DIAGNOSIS — Z87440 Personal history of urinary (tract) infections: Secondary | ICD-10-CM

## 2023-12-05 DIAGNOSIS — N201 Calculus of ureter: Secondary | ICD-10-CM

## 2023-12-05 DIAGNOSIS — N200 Calculus of kidney: Secondary | ICD-10-CM

## 2023-12-05 DIAGNOSIS — N202 Calculus of kidney with calculus of ureter: Secondary | ICD-10-CM | POA: Diagnosis not present

## 2023-12-05 LAB — URINALYSIS, ROUTINE W REFLEX MICROSCOPIC
Bilirubin, UA: NEGATIVE
Glucose, UA: NEGATIVE
Ketones, UA: NEGATIVE
Nitrite, UA: NEGATIVE
Specific Gravity, UA: >=1.03 — AB (ref 1.005–1.030)
Urobilinogen, Ur: 0.2 mg/dL (ref 0.2–1.0)
pH, UA: 5.5 (ref 5.0–7.5)

## 2023-12-05 LAB — MICROSCOPIC EXAMINATION
Crystals: POSITIVE — AB
Mucus, UA: POSITIVE — AB
RBC, Urine: >30 /HPF — AB (ref 0–2)

## 2023-12-05 MED ORDER — NITROFURANTOIN MONOHYD MACRO 100 MG PO CAPS: 100.0000 mg | ORAL_CAPSULE | Freq: Every day | ORAL | 0 refills | Status: DC

## 2023-12-05 NOTE — H&P (View-Only) (Signed)
 Assessment: 1. Ureteral calculus, left, proximal   2. Nephrolithiasis   3. History of UTI     Plan: I personally reviewed the patient's chart including provider notes, lab and imaging results. I personally reviewed the CT study from 11/07/2023 which showed a 3 x 6 mm calculus in the proximal left ureter. I personally reviewed the KUB study from today with results as noted below. Urine culture sent today. Recommend daily Macrobid .  I discussed options for management of her left ureteral calculus which now appears to be in the left kidney following stent placement.  Specifically, I discussed the options of shockwave lithotripsy and ureteroscopic laser lithotripsy.  Following our discussion, she would like to proceed with ureteroscopic laser lithotripsy.   Risks, benefits, alternatives were discussed with the patient in detail.  Potential risks including, but not limited to, infection; bleeding;  injury to urethra, bladder, or ureter; possible need of other treatments; possible failure to remove the calculus; ureteral  stricture formation; cardiac, pulmonary, cerebrovascular events; and anesthetic complications were discussed.  The patient understands and wishes to proceed.   Procedure: The patient will be scheduled for left ureteroscopic laser lithotripsy, exchange of left ureteral stent at [facility].  Surgical request is placed with the surgery schedulers and will be scheduled at the patient's/family request. Informed consent is given as documented below. Anesthesia: General  The patient does not have sleep apnea, history of MRSA, history of VRE, history of cardiac device requiring special anesthetic needs. Patient is stable and considered clear for surgical management in an outpatient ambulatory surgery setting as well as inpatient hospital setting.  Consent for Operation or Procedure: Provider Certification I hereby certify that the nature, purpose, benefits, usual and most frequent  risks of, and alternatives to, the operation or procedure have been explained to the patient (or person authorized to sign for the patient) either by me as responsible physician or by the provider who is to perform the operation or procedure. Time spent such that the patient/family has had an opportunity to ask questions, and that those questions have been answered. The patient or the patient's representative has been advised that selected tasks may be performed by assistants to the primary health care provider(s). I believe that the patient (or person authorized to sign for the patient) understands what has been explained, and has consented to the operation or procedure. No guarantees were implied or made.   Chief Complaint:  Chief Complaint  Patient presents with   Nephrolithiasis    History of Present Illness:  Tracie Green is a 48 y.o. female who is seen in consultation from Glendia Elizabeth, MD for evaluation of left ureteral calculus and UTI. She presented to the emergency room on 11/07/2023 with left flank pain.  She also developed a low-grade fever.  CT imaging showed a 6.5 mm calculus in the left proximal ureter with associated obstruction. She was taken to the operating room on 11/07/2023 where she underwent cystoscopy and insertion of a left ureteral stent. Urine culture grew >100 K E. coli. She was discharged on cephalexin .  She completed her antibiotics approximately 2 weeks ago.  She is tolerating the stent fairly well.  She is having some intermittent discomfort in the left flank.  No fevers or chills.  No dysuria or gross hematuria.  She previously underwent right ureteroscopic laser lithotripsy in [place] in 2022.   Past Medical History:  Past Medical History:  Diagnosis Date   Hypertension    PE (pulmonary embolism)  Past Surgical History:  Past Surgical History:  Procedure Laterality Date   CESAREAN SECTION     CHOLECYSTECTOMY  1998   CYSTOSCOPY WITH STENT  PLACEMENT Left 11/07/2023   Procedure: CYSTOSCOPY, WITH STENT INSERTION;  Surgeon: Gaston Hamilton, MD;  Location: [facility];  Service: Urology;  Laterality: Left;   INCISION AND DRAINAGE      Allergies:  Allergies  Allergen Reactions   Tramadol  Itching    edema   Penicillins Hives and Rash    Hives   Morphine  Nausea And Vomiting   Morphine  And Codeine  Nausea And Vomiting    Family History:  Family History  Problem Relation Age of Onset   Breast cancer Mother 23   Ovarian cancer Mother    BRCA 1/2 Neg Hx     Social History:  Social History   Tobacco Use   Smoking status: Never   Smokeless tobacco: Never  Vaping Use   Vaping status: Never Used  Substance Use Topics   Alcohol use: No   Drug use: No    Review of symptoms:  Constitutional:  Negative for unexplained weight loss, night sweats, fever, chills ENT:  Negative for nose bleeds, sinus pain, painful swallowing CV:  Negative for chest pain, shortness of breath, exercise intolerance, palpitations, loss of consciousness Resp:  Negative for cough, wheezing, shortness of breath GI:  Negative for nausea, vomiting, diarrhea, bloody stools GU:  Positives noted in HPI; otherwise negative for gross hematuria, dysuria, urinary incontinence Neuro:  Negative for seizures, poor balance, limb weakness, slurred speech Psych:  Negative for lack of energy, depression, anxiety Endocrine:  Negative for polydipsia, polyuria, symptoms of hypoglycemia (dizziness, hunger, sweating) Hematologic:  Negative for anemia, purpura, petechia, prolonged or excessive bleeding, use of anticoagulants  Allergic:  Negative for difficulty breathing or choking as a result of exposure to anything; no shellfish allergy; no allergic response (rash/itch) to materials, foods  Physical exam: BP (!) 145/90   Pulse 90   Ht 5' 9 (1.753 m)   Wt (!) 372 lb (168.7 kg)   LMP 10/14/2023 (Exact Date) Comment: Pregnancy test Negative 11/07/2023  BMI 54.93 kg/m   GENERAL APPEARANCE:  Well appearing, well developed, well nourished, NAD HEENT: Atraumatic, Normocephalic, oropharynx clear. NECK: Supple without lymphadenopathy or thyromegaly. LUNGS: Clear to auscultation bilaterally. HEART: Regular Rate and Rhythm without murmurs, gallops, or rubs. ABDOMEN: Soft, non-tender, No Masses. EXTREMITIES: Moves all extremities well.  Without clubbing, cyanosis, or edema. NEUROLOGIC:  Alert and oriented x 3, normal gait, CN II-XII grossly intact.  MENTAL STATUS:  Appropriate. BACK:  Non-tender to palpation.  No CVAT SKIN:  Warm, dry and intact.    Results: U/A: 6-10 WBCs, >30 RBCs, few bacteria, nitrite negative  KUB: Left ureteral stent in good position; 5 x 4 mm calcification just inferior to the proximal stent coil

## 2023-12-05 NOTE — Progress Notes (Signed)
 Assessment: 1. Ureteral calculus, left, proximal   2. Nephrolithiasis   3. History of UTI     Plan: I personally reviewed the patient's chart including provider notes, lab and imaging results. I personally reviewed the CT study from 11/07/2023 which showed a 3 x 6 mm calculus in the proximal left ureter. I personally reviewed the KUB study from today with results as noted below. Urine culture sent today. Recommend daily Macrobid .  I discussed options for management of her left ureteral calculus which now appears to be in the left kidney following stent placement.  Specifically, I discussed the options of shockwave lithotripsy and ureteroscopic laser lithotripsy.  Following our discussion, she would like to proceed with ureteroscopic laser lithotripsy.   Risks, benefits, alternatives were discussed with the patient in detail.  Potential risks including, but not limited to, infection; bleeding;  injury to urethra, bladder, or ureter; possible need of other treatments; possible failure to remove the calculus; ureteral  stricture formation; cardiac, pulmonary, cerebrovascular events; and anesthetic complications were discussed.  The patient understands and wishes to proceed.   Procedure: The patient will be scheduled for left ureteroscopic laser lithotripsy, exchange of left ureteral stent at [facility].  Surgical request is placed with the surgery schedulers and will be scheduled at the patient's/family request. Informed consent is given as documented below. Anesthesia: General  The patient does not have sleep apnea, history of MRSA, history of VRE, history of cardiac device requiring special anesthetic needs. Patient is stable and considered clear for surgical management in an outpatient ambulatory surgery setting as well as inpatient hospital setting.  Consent for Operation or Procedure: Provider Certification I hereby certify that the nature, purpose, benefits, usual and most frequent  risks of, and alternatives to, the operation or procedure have been explained to the patient (or person authorized to sign for the patient) either by me as responsible physician or by the provider who is to perform the operation or procedure. Time spent such that the patient/family has had an opportunity to ask questions, and that those questions have been answered. The patient or the patient's representative has been advised that selected tasks may be performed by assistants to the primary health care provider(s). I believe that the patient (or person authorized to sign for the patient) understands what has been explained, and has consented to the operation or procedure. No guarantees were implied or made.   Chief Complaint:  Chief Complaint  Patient presents with   Nephrolithiasis    History of Present Illness:  Tracie Green is a 48 y.o. female who is seen in consultation from Glendia Elizabeth, MD for evaluation of left ureteral calculus and UTI. She presented to the emergency room on 11/07/2023 with left flank pain.  She also developed a low-grade fever.  CT imaging showed a 6.5 mm calculus in the left proximal ureter with associated obstruction. She was taken to the operating room on 11/07/2023 where she underwent cystoscopy and insertion of a left ureteral stent. Urine culture grew >100 K E. coli. She was discharged on cephalexin .  She completed her antibiotics approximately 2 weeks ago.  She is tolerating the stent fairly well.  She is having some intermittent discomfort in the left flank.  No fevers or chills.  No dysuria or gross hematuria.  She previously underwent right ureteroscopic laser lithotripsy in [place] in 2022.   Past Medical History:  Past Medical History:  Diagnosis Date   Hypertension    PE (pulmonary embolism)  Past Surgical History:  Past Surgical History:  Procedure Laterality Date   CESAREAN SECTION     CHOLECYSTECTOMY  1998   CYSTOSCOPY WITH STENT  PLACEMENT Left 11/07/2023   Procedure: CYSTOSCOPY, WITH STENT INSERTION;  Surgeon: Gaston Hamilton, MD;  Location: [facility];  Service: Urology;  Laterality: Left;   INCISION AND DRAINAGE      Allergies:  Allergies  Allergen Reactions   Tramadol  Itching    edema   Penicillins Hives and Rash    Hives   Morphine  Nausea And Vomiting   Morphine  And Codeine  Nausea And Vomiting    Family History:  Family History  Problem Relation Age of Onset   Breast cancer Mother 23   Ovarian cancer Mother    BRCA 1/2 Neg Hx     Social History:  Social History   Tobacco Use   Smoking status: Never   Smokeless tobacco: Never  Vaping Use   Vaping status: Never Used  Substance Use Topics   Alcohol use: No   Drug use: No    Review of symptoms:  Constitutional:  Negative for unexplained weight loss, night sweats, fever, chills ENT:  Negative for nose bleeds, sinus pain, painful swallowing CV:  Negative for chest pain, shortness of breath, exercise intolerance, palpitations, loss of consciousness Resp:  Negative for cough, wheezing, shortness of breath GI:  Negative for nausea, vomiting, diarrhea, bloody stools GU:  Positives noted in HPI; otherwise negative for gross hematuria, dysuria, urinary incontinence Neuro:  Negative for seizures, poor balance, limb weakness, slurred speech Psych:  Negative for lack of energy, depression, anxiety Endocrine:  Negative for polydipsia, polyuria, symptoms of hypoglycemia (dizziness, hunger, sweating) Hematologic:  Negative for anemia, purpura, petechia, prolonged or excessive bleeding, use of anticoagulants  Allergic:  Negative for difficulty breathing or choking as a result of exposure to anything; no shellfish allergy; no allergic response (rash/itch) to materials, foods  Physical exam: BP (!) 145/90   Pulse 90   Ht 5' 9 (1.753 m)   Wt (!) 372 lb (168.7 kg)   LMP 10/14/2023 (Exact Date) Comment: Pregnancy test Negative 11/07/2023  BMI 54.93 kg/m   GENERAL APPEARANCE:  Well appearing, well developed, well nourished, NAD HEENT: Atraumatic, Normocephalic, oropharynx clear. NECK: Supple without lymphadenopathy or thyromegaly. LUNGS: Clear to auscultation bilaterally. HEART: Regular Rate and Rhythm without murmurs, gallops, or rubs. ABDOMEN: Soft, non-tender, No Masses. EXTREMITIES: Moves all extremities well.  Without clubbing, cyanosis, or edema. NEUROLOGIC:  Alert and oriented x 3, normal gait, CN II-XII grossly intact.  MENTAL STATUS:  Appropriate. BACK:  Non-tender to palpation.  No CVAT SKIN:  Warm, dry and intact.    Results: U/A: 6-10 WBCs, >30 RBCs, few bacteria, nitrite negative  KUB: Left ureteral stent in good position; 5 x 4 mm calcification just inferior to the proximal stent coil

## 2023-12-07 ENCOUNTER — Ambulatory Visit: Payer: Self-pay | Admitting: Urology

## 2023-12-07 ENCOUNTER — Encounter (HOSPITAL_COMMUNITY): Payer: Self-pay | Admitting: Urology

## 2023-12-07 DIAGNOSIS — N201 Calculus of ureter: Secondary | ICD-10-CM

## 2023-12-07 LAB — URINE CULTURE

## 2023-12-07 NOTE — Progress Notes (Addendum)
 For Anesthesia: PCP - Emmy Josephus Blanch, PA-C  Cardiologist -   Bowel Prep reminder:  Chest x-ray - 11/07/23 in [facility] EKG - 11/07/23 in [facility] Stress Test -  ECHO - 01/25/22 in [facility] Cardiac Cath -  Pacemaker/ICD device last checked: Pacemaker orders received: Device Rep notified:  Spinal Cord Stimulator:  Sleep Study - 05/18/22 in [facility] CPAP -   Fasting Blood Sugar -  Checks Blood Sugar _____ times a day Date and result of last Hgb A1c-  Last dose of GLP1 agonist-  GLP1 instructions: Hold 7 days prior to schedule (Hold 24 hours-daily)   Last dose of SGLT-2 inhibitors-  SGLT-2 instructions: Hold 72 hours prior to surgery  Blood Thinner Instructions: Aspirin  Instructions: Last Dose:  Activity level: Can go up a flight of stairs and activities of daily living without stopping and without chest pain and/or shortness of breath   Able to exercise without chest pain and/or shortness of breath   Unable to go up a flight of stairs without chest pain and/or shortness of breath     Anesthesia review: HTN  Patient denies shortness of breath, fever, cough and chest pain at PAT appointment   Patient verbalized understanding of instructions that were reviewed over the telephone.

## 2023-12-10 ENCOUNTER — Telehealth: Payer: Self-pay | Admitting: Urology

## 2023-12-10 NOTE — Telephone Encounter (Signed)
 Patient needs to r/s surgery

## 2023-12-11 NOTE — Telephone Encounter (Signed)
 Spoke with pt in reference to surgery and post op appt. Both appts have been rescheduled.

## 2023-12-18 ENCOUNTER — Ambulatory Visit (HOSPITAL_COMMUNITY): Admission: RE | Admit: 2023-12-18 | Payer: MEDICAID | Source: Home / Self Care | Admitting: Urology

## 2023-12-18 ENCOUNTER — Encounter (HOSPITAL_COMMUNITY): Admission: RE | Payer: Self-pay | Source: Home / Self Care

## 2023-12-18 SURGERY — CYSTOSCOPY/URETEROSCOPY/HOLMIUM LASER/STENT PLACEMENT
Anesthesia: General | Laterality: Left

## 2023-12-24 NOTE — Progress Notes (Signed)
 Patient appointment completed over the phone. She is coming in for labs 12/26/23 at 1430. Provided  phone number to call for med rec  COVID Vaccine Completed:  Date of COVID positive in last 90 days:  PCP - Heron Nephew- at [facility] Cardiologist - n/a  Chest x-ray - 11/07/23 [facility] EKG - 11/07/23 [facility] Stress Test - N/A ECHO - 01/25/22 [facility] Cardiac Cath - n/a Pacemaker/ICD device last checked:N/A Spinal Cord Stimulator:N/A  Bowel Prep - N/A  Sleep Study - negative per pt CPAP -   Fasting Blood Sugar - N/A Checks Blood Sugar _____ times a day  Last dose of GLP1 agonist-  N/A GLP1 instructions:  Do not take after     Last dose of SGLT-2 inhibitors-  N/A SGLT-2 instructions:  Do not take after     Blood Thinner Instructions: N/A Last dose:   Time: Aspirin  Instructions:N/A Last Dose:  Activity level: Can perform activities of daily living without stopping and without symptoms of chest pain or shortness of breath. Ambulates with cane or walker due to weakness from stroke. Daughter has to come help with showering and bathing. Difficulty with stairs.  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 Patient was also instructed that they will need to review over the PAT instructions again at home before surgery.

## 2023-12-25 ENCOUNTER — Encounter (HOSPITAL_COMMUNITY)
Admission: RE | Admit: 2023-12-25 | Discharge: 2023-12-25 | Disposition: A | Discharge status: Home / Self Care | Payer: MEDICAID | Source: Ambulatory Visit | Attending: Urology | Admitting: Urology

## 2023-12-25 ENCOUNTER — Encounter (HOSPITAL_COMMUNITY): Payer: Self-pay

## 2023-12-25 ENCOUNTER — Other Ambulatory Visit: Payer: Self-pay

## 2023-12-25 ENCOUNTER — Telehealth: Payer: Self-pay | Admitting: Urology

## 2023-12-25 DIAGNOSIS — F419 Anxiety disorder, unspecified: Secondary | ICD-10-CM | POA: Insufficient documentation

## 2023-12-25 DIAGNOSIS — F329 Major depressive disorder, single episode, unspecified: Secondary | ICD-10-CM | POA: Diagnosis not present

## 2023-12-25 DIAGNOSIS — N202 Calculus of kidney with calculus of ureter: Secondary | ICD-10-CM | POA: Insufficient documentation

## 2023-12-25 DIAGNOSIS — E669 Obesity, unspecified: Secondary | ICD-10-CM | POA: Diagnosis not present

## 2023-12-25 DIAGNOSIS — Z6841 Body Mass Index (BMI) 40.0 and over, adult: Secondary | ICD-10-CM | POA: Insufficient documentation

## 2023-12-25 DIAGNOSIS — G894 Chronic pain syndrome: Secondary | ICD-10-CM | POA: Diagnosis not present

## 2023-12-25 DIAGNOSIS — I10 Essential (primary) hypertension: Secondary | ICD-10-CM | POA: Insufficient documentation

## 2023-12-25 DIAGNOSIS — F1120 Opioid dependence, uncomplicated: Secondary | ICD-10-CM | POA: Insufficient documentation

## 2023-12-25 DIAGNOSIS — Z01818 Encounter for other preprocedural examination: Secondary | ICD-10-CM | POA: Insufficient documentation

## 2023-12-25 DIAGNOSIS — Z86711 Personal history of pulmonary embolism: Secondary | ICD-10-CM | POA: Diagnosis not present

## 2023-12-25 HISTORY — DX: Unspecified osteoarthritis, unspecified site: M19.90

## 2023-12-25 HISTORY — DX: Cerebral infarction, unspecified: I63.9

## 2023-12-25 HISTORY — DX: Depression, unspecified: F32.A

## 2023-12-25 HISTORY — DX: Anemia, unspecified: D64.9

## 2023-12-25 HISTORY — DX: Fatty (change of) liver, not elsewhere classified: K76.0

## 2023-12-25 HISTORY — DX: Personal history of urinary calculi: Z87.442

## 2023-12-25 HISTORY — DX: Gastro-esophageal reflux disease without esophagitis: K21.9

## 2023-12-25 HISTORY — DX: Headache, unspecified: R51.9

## 2023-12-25 HISTORY — DX: Anxiety disorder, unspecified: F41.9

## 2023-12-25 NOTE — Telephone Encounter (Signed)
 Pt called and lvm to confirm time of her labs pre op made pt aware of time. Pt asked if she could move that apt to tomorrow I gave her the number listed in the apt desk information to call and schedule.

## 2023-12-25 NOTE — Patient Instructions (Addendum)
 SURGICAL WAITING ROOM VISITATION  Patients having surgery or a procedure may have no more than 2 support people in the waiting area - these visitors may rotate.    Children under the age of 49 must have an adult with them who is not the patient.  Visitors with respiratory illnesses are discouraged from visiting and should remain at home.  If the patient needs to stay at the hospital during part of their recovery, the visitor guidelines for inpatient rooms apply. Pre-op nurse will coordinate an appropriate time for 1 support person to accompany patient in pre-op.  This support person may not rotate.    Please refer to the [facility] website for the visitor guidelines for Inpatients (after your surgery is over and you are in a regular room).    Your procedure is scheduled on: 01/01/24   Report to [facility] Main Entrance    Report to admitting at 11:30 AM   Call this number if you have problems the morning of surgery [phone]   Do not eat food or drink liquids :After Midnight.          If you have questions, please contact your surgeon's office.   FOLLOW BOWEL PREP AND ANY ADDITIONAL PRE OP INSTRUCTIONS YOU RECEIVED FROM YOUR SURGEON'S OFFICE!!!     Oral Hygiene is also important to reduce your risk of infection.                                    Remember - BRUSH YOUR TEETH THE MORNING OF SURGERY WITH YOUR REGULAR TOOTHPASTE  DENTURES WILL BE REMOVED PRIOR TO SURGERY PLEASE DO NOT APPLY Poly grip OR ADHESIVES!!!   Stop all vitamins and herbal supplements 7 days before surgery.   Take these medicines the morning of surgery with A SIP OF WATER : Inhalers, Allopurinol , Alprazolam , Atorvastatin , Gabapentin , Metoprolol , Zofran                               You may not have any metal on your body including hair pins, jewelry, and body piercing             Do not wear make-up, lotions, powders, perfumes, or deodorant  Do not wear nail polish including gel and S&S,  artificial/acrylic nails, or any other type of covering on natural nails including finger and toenails. If you have artificial nails, gel coating, etc. that needs to be removed by a nail salon please have this removed prior to surgery or surgery may need to be canceled/ delayed if the surgeon/ anesthesia feels like they are unable to be safely monitored.   Do not shave  48 hours prior to surgery.    Do not bring valuables to the hospital. [facility] IS NOT             RESPONSIBLE   FOR VALUABLES.   Contacts, glasses, dentures or bridgework may not be worn into surgery.  DO NOT BRING YOUR HOME MEDICATIONS TO THE HOSPITAL. PHARMACY WILL DISPENSE MEDICATIONS LISTED ON YOUR MEDICATION LIST TO YOU DURING YOUR ADMISSION IN THE HOSPITAL!    Patients discharged on the day of surgery will not be allowed to drive home.  Someone NEEDS to stay with you for the first 24 hours after anesthesia.              Please read over the  following fact sheets you were given: IF YOU HAVE QUESTIONS ABOUT YOUR PRE-OP INSTRUCTIONS PLEASE CALL [phone] [place].   If you received a COVID test during your pre-op visit  it is requested that you wear a mask when out in public, stay away from anyone that may not be feeling well and notify your surgeon if you develop symptoms. If you test positive for Covid or have been in contact with anyone that has tested positive in the last 10 days please notify you surgeon.    [place] - Preparing for Surgery Before surgery, you can play an important role.  Because skin is not sterile, your skin needs to be as free of germs as possible.  You can reduce the number of germs on your skin by washing with CHG (chlorahexidine gluconate) soap before surgery.  CHG is an antiseptic cleaner which kills germs and bonds with the skin to continue killing germs even after washing. Please DO NOT use if you have an allergy to CHG or antibacterial soaps.  If your skin becomes reddened/irritated stop  using the CHG and inform your nurse when you arrive at Short Stay. Do not shave (including legs and underarms) for at least 48 hours prior to the first CHG shower.  You may shave your face/neck.  Please follow these instructions carefully:  1.  Shower with CHG Soap the night before surgery and the  morning of surgery.  2.  If you choose to wash your hair, wash your hair first as usual with your normal  shampoo.  3.  After you shampoo, rinse your hair and body thoroughly to remove the shampoo.                             4.  Use CHG as you would any other liquid soap.  You can apply chg directly to the skin and wash.  Gently with a scrungie or clean washcloth.  5.  Apply the CHG Soap to your body ONLY FROM THE NECK DOWN.   Do   not use on face/ open                           Wound or open sores. Avoid contact with eyes, ears mouth and   genitals (private parts).                       Wash face,  Genitals (private parts) with your normal soap.             6.  Wash thoroughly, paying special attention to the area where your    surgery  will be performed.  7.  Thoroughly rinse your body with warm water  from the neck down.  8.  DO NOT shower/wash with your normal soap after using and rinsing off the CHG Soap.                9.  Pat yourself dry with a clean towel.            10.  Wear clean pajamas.            11.  Place clean sheets on your bed the night of your first shower and do not  sleep with pets. Day of Surgery : Do not apply any lotions/deodorants the morning of surgery.  Please wear clean clothes to the hospital/surgery center.  FAILURE TO FOLLOW  THESE INSTRUCTIONS MAY RESULT IN THE CANCELLATION OF YOUR SURGERY  PATIENT SIGNATURE_________________________________  NURSE SIGNATURE__________________________________  ________________________________________________________________________

## 2023-12-26 ENCOUNTER — Encounter (HOSPITAL_COMMUNITY)
Admission: RE | Admit: 2023-12-26 | Discharge: 2023-12-26 | Disposition: A | Discharge status: Home / Self Care | Payer: MEDICAID | Source: Ambulatory Visit | Attending: Urology | Admitting: Urology

## 2023-12-26 DIAGNOSIS — Z01812 Encounter for preprocedural laboratory examination: Secondary | ICD-10-CM | POA: Insufficient documentation

## 2023-12-26 DIAGNOSIS — I10 Essential (primary) hypertension: Secondary | ICD-10-CM | POA: Diagnosis not present

## 2023-12-26 DIAGNOSIS — Z01818 Encounter for other preprocedural examination: Secondary | ICD-10-CM | POA: Diagnosis present

## 2023-12-26 LAB — BASIC METABOLIC PANEL WITH GFR
Anion gap: 11 (ref 5–15)
BUN: 19 mg/dL (ref 6–20)
CO2: 23 mmol/L (ref 22–32)
Calcium: 8.9 mg/dL (ref 8.9–10.3)
Chloride: 105 mmol/L (ref 98–111)
Creatinine, Ser: 1.15 mg/dL — ABNORMAL HIGH (ref 0.44–1.00)
GFR, Estimated: 59 mL/min — ABNORMAL LOW (ref >60)
Glucose, Bld: 85 mg/dL (ref 70–99)
Potassium: 3.8 mmol/L (ref 3.5–5.1)
Sodium: 140 mmol/L (ref 135–145)

## 2023-12-26 LAB — CBC
HCT: 37.4 % (ref 36.0–46.0)
Hemoglobin: 11.8 g/dL — ABNORMAL LOW (ref 12.0–15.0)
MCH: 26.9 pg (ref 26.0–34.0)
MCHC: 31.6 g/dL (ref 30.0–36.0)
MCV: 85.2 fL (ref 80.0–100.0)
Platelets: 308 K/uL (ref 150–400)
RBC: 4.39 MIL/uL (ref 3.87–5.11)
RDW: 14.4 % (ref 11.5–15.5)
WBC: 7.8 K/uL (ref 4.0–10.5)
nRBC: 0 % (ref 0.0–0.2)

## 2023-12-27 ENCOUNTER — Encounter (HOSPITAL_COMMUNITY): Payer: Self-pay

## 2023-12-27 ENCOUNTER — Ambulatory Visit: Payer: MEDICAID | Admitting: Urology

## 2023-12-27 NOTE — Progress Notes (Signed)
 Case: [id number] Date/Time: 01/01/24 1330   Procedure: CYSTOSCOPY/URETEROSCOPY/HOLMIUM LASER/STENT PLACEMENT (Left)   Anesthesia type: General   Diagnosis: Calculus of ureter [N20.1]   Pre-op diagnosis: Calculus of ureter   Location[facility] ROOM 03 / [facility]   Surgeons: Roseann Adine PARAS., MD       DISCUSSION: Tracie Green is a 48 yo female with PMH of HTN, hx of PE (remotely), migraines, GERD, anemia, anxiety, depression, chronic pain syndrome with narcotic dependence, obesity (BMI 54).  Patient admitted from 8/6-8/13 due to urosepsis from obstructing kidney stone. She went to the OR on 8/6 for stent placement. Now scheduled for definitive management  Hx of possible CVA with left sided weakness in 01/2022. Received TNK. MRI brain was negative for acute strokes. Patient reports she has ongoing left sided weakness at PAT visit.  VS: LMP 12/04/2023 (Approximate) Comment: Pregnancy test Negative 11/07/2023  PROVIDERS: Tobie Emmy Gully, PA-C   LABS: Labs reviewed: Acceptable for surgery. (all labs ordered are listed, but only abnormal results are displayed)  Labs Reviewed - No data to display   CTA Chest/Abd/Pelvis 11/07/23:  IMPRESSION: Proximal left ureter partially obstructive calculus with mild fullness of the left collecting system and peri ureteral fat stranding. Additional punctate nonobstructive nephrolithiasis in left kidney upper pole.   No suspicious vascular lesion .   Hepatomegaly and hepatic steatosis.   Cholecystectomy with mild biliary dilatation (reservoir phenomenon).  EKG 11/07/23:  Sinus rhythm Baseline wander in lead(s) V3 V5 V6  Echo 01/25/2022:  IMPRESSIONS     1. Left ventricular ejection fraction, by estimation, is 50 to 55%. The  left ventricle has low normal function. The left ventricle has no regional  wall motion abnormalities. Left ventricular diastolic parameters were  normal.   2. Right ventricular systolic function is normal. The right  ventricular  size is normal.   3. The mitral valve is normal in structure. Trivial mitral valve  regurgitation. No evidence of mitral stenosis.   4. The aortic valve is normal in structure. Aortic valve regurgitation is  not visualized. No aortic stenosis is present.   5. The inferior vena cava is normal in size with greater than 50%  respiratory variability, suggesting right atrial pressure of 3 mmHg.   Past Medical History:  Diagnosis Date   Anemia    Anxiety    Arthritis    Depression    Fatty liver    GERD (gastroesophageal reflux disease)    Headache    migraines   History of kidney stones    Hypertension    PE (pulmonary embolism)    Sepsis (HCC) 11/2023   Stroke [facility])     Past Surgical History:  Procedure Laterality Date   CESAREAN SECTION     x4   CHOLECYSTECTOMY  1998   CYSTOSCOPY WITH STENT PLACEMENT Left 11/07/2023   Procedure: CYSTOSCOPY, WITH STENT INSERTION;  Surgeon: Gaston Hamilton, MD;  Location[facility];  Service: Urology;  Laterality: Left;   INCISION AND DRAINAGE     right wrist   TONSILLECTOMY      MEDICATIONS:  albuterol  (VENTOLIN  HFA) 108 (90 Base) MCG/ACT inhaler   allopurinol  (ZYLOPRIM ) 100 MG tablet   ALPRAZolam  (XANAX ) 0.5 MG tablet   aspirin -acetaminophen -caffeine  (EXCEDRIN  MIGRAINE) 250-250-65 MG tablet   atorvastatin  (LIPITOR) 40 MG tablet   cephALEXin  (KEFLEX ) 500 MG capsule   cyanocobalamin (VITAMIN B12) 1000 MCG/ML injection   famotidine  (PEPCID ) 20 MG tablet   gabapentin  (NEURONTIN ) 300 MG capsule   hyoscyamine  (LEVSIN  SL) 0.125  MG SL tablet   levocetirizine (XYZAL ) 5 MG tablet   metoprolol  succinate (TOPROL -XL) 25 MG 24 hr tablet   naloxone (NARCAN) nasal spray 4 mg/0.1 mL   nitrofurantoin , macrocrystal-monohydrate, (MACROBID ) 100 MG capsule   ondansetron  (ZOFRAN -ODT) 4 MG disintegrating tablet   polyethylene glycol (MIRALAX  / GLYCOLAX ) 17 g packet   senna-docusate (SENOKOT-S) 8.6-50 MG tablet   tiZANidine  (ZANAFLEX ) 4 MG  tablet   UBRELVY  100 MG TABS   No current facility-administered medications for this encounter.    Burnard CHRISTELLA Odis DEVONNA [facility] Short Stay/Anesthesiology [facility] Phone [phone] 12/27/2023 10:31 AM

## 2023-12-27 NOTE — Anesthesia Preprocedure Evaluation (Addendum)
 Anesthesia Evaluation  Patient identified by MRN, date of birth, ID band Patient awake    Reviewed: Allergy & Precautions, H&P , NPO status , Patient's Chart, lab work & pertinent test results  Airway Mallampati: II   Neck ROM: full    Dental   Pulmonary neg pulmonary ROS   breath sounds clear to auscultation       Cardiovascular hypertension,  Rhythm:regular Rate:Normal     Neuro/Psych  Headaches PSYCHIATRIC DISORDERS Anxiety Depression    CVA    GI/Hepatic ,GERD  ,,  Endo/Other    Class 4 obesity  Renal/GU stones     Musculoskeletal  (+) Arthritis ,    Abdominal   Peds  Hematology   Anesthesia Other Findings   Reproductive/Obstetrics                              Anesthesia Physical Anesthesia Plan  ASA: 3  Anesthesia Plan: General   Post-op Pain Management:    Induction: Intravenous  PONV Risk Score and Plan: 3 and Ondansetron , Dexamethasone , Midazolam  and Treatment may vary due to age or medical condition  Airway Management Planned: Oral ETT  Additional Equipment:   Intra-op Plan:   Post-operative Plan: Extubation in OR  Informed Consent: I have reviewed the patients History and Physical, chart, labs and discussed the procedure including the risks, benefits and alternatives for the proposed anesthesia with the patient or authorized representative who has indicated his/her understanding and acceptance.     Dental advisory given  Plan Discussed with: CRNA, Anesthesiologist and Surgeon  Anesthesia Plan Comments: (See PAT note from 9/23)         Anesthesia Quick Evaluation

## 2024-01-01 ENCOUNTER — Encounter (HOSPITAL_COMMUNITY): Payer: Self-pay | Admitting: Urology

## 2024-01-01 ENCOUNTER — Ambulatory Visit (HOSPITAL_COMMUNITY): Payer: MEDICAID

## 2024-01-01 ENCOUNTER — Other Ambulatory Visit: Payer: Self-pay

## 2024-01-01 ENCOUNTER — Encounter (HOSPITAL_COMMUNITY)
Admission: RE | Disposition: A | Discharge status: Home / Self Care | Payer: Self-pay | Source: Home / Self Care | Attending: Urology

## 2024-01-01 ENCOUNTER — Ambulatory Visit (HOSPITAL_BASED_OUTPATIENT_CLINIC_OR_DEPARTMENT_OTHER): Payer: MEDICAID | Admitting: Anesthesiology

## 2024-01-01 ENCOUNTER — Ambulatory Visit (HOSPITAL_COMMUNITY)
Admission: RE | Admit: 2024-01-01 | Discharge: 2024-01-01 | Disposition: A | Discharge status: Home / Self Care | Payer: MEDICAID | Attending: Urology | Admitting: Urology

## 2024-01-01 ENCOUNTER — Ambulatory Visit (HOSPITAL_COMMUNITY): Payer: MEDICAID | Admitting: Medical

## 2024-01-01 DIAGNOSIS — M1990 Unspecified osteoarthritis, unspecified site: Secondary | ICD-10-CM | POA: Diagnosis not present

## 2024-01-01 DIAGNOSIS — Z8673 Personal history of transient ischemic attack (TIA), and cerebral infarction without residual deficits: Secondary | ICD-10-CM | POA: Diagnosis not present

## 2024-01-01 DIAGNOSIS — I10 Essential (primary) hypertension: Secondary | ICD-10-CM | POA: Diagnosis not present

## 2024-01-01 DIAGNOSIS — E6689 Other obesity not elsewhere classified: Secondary | ICD-10-CM | POA: Insufficient documentation

## 2024-01-01 DIAGNOSIS — Z87440 Personal history of urinary (tract) infections: Secondary | ICD-10-CM | POA: Diagnosis not present

## 2024-01-01 DIAGNOSIS — Z466 Encounter for fitting and adjustment of urinary device: Secondary | ICD-10-CM | POA: Insufficient documentation

## 2024-01-01 DIAGNOSIS — K219 Gastro-esophageal reflux disease without esophagitis: Secondary | ICD-10-CM | POA: Insufficient documentation

## 2024-01-01 DIAGNOSIS — N200 Calculus of kidney: Secondary | ICD-10-CM

## 2024-01-01 DIAGNOSIS — N201 Calculus of ureter: Secondary | ICD-10-CM | POA: Diagnosis present

## 2024-01-01 DIAGNOSIS — Z6841 Body Mass Index (BMI) 40.0 and over, adult: Secondary | ICD-10-CM | POA: Diagnosis not present

## 2024-01-01 HISTORY — PX: CYSTOSCOPY/URETEROSCOPY/HOLMIUM LASER/STENT PLACEMENT: SHX6546

## 2024-01-01 LAB — POCT PREGNANCY, URINE: Preg Test, Ur: NEGATIVE

## 2024-01-01 SURGERY — CYSTOSCOPY/URETEROSCOPY/HOLMIUM LASER/STENT PLACEMENT
Anesthesia: General | Laterality: Left

## 2024-01-01 MED ORDER — LACTATED RINGERS IV SOLN: INTRAVENOUS | Status: DC

## 2024-01-01 MED ORDER — KETOROLAC TROMETHAMINE 30 MG/ML IJ SOLN
INTRAMUSCULAR | Status: DC | PRN
  Administered 2024-01-01: 15 mg via INTRAVENOUS

## 2024-01-01 MED ORDER — ONDANSETRON HCL 4 MG/2ML IJ SOLN: 4.0000 mg | Freq: Four times a day (QID) | INTRAMUSCULAR | Status: DC | PRN

## 2024-01-01 MED ORDER — SODIUM CHLORIDE 0.9 % IR SOLN
Status: DC | PRN
  Administered 2024-01-01: 3000 mL via INTRAVESICAL

## 2024-01-01 MED ORDER — DEXMEDETOMIDINE HCL IN NACL 80 MCG/20ML IV SOLN
INTRAVENOUS | Status: DC | PRN
  Administered 2024-01-01: 10 ug via INTRAVENOUS

## 2024-01-01 MED ORDER — LIDOCAINE HCL URETHRAL/MUCOSAL 2 % EX GEL
CUTANEOUS | Status: DC | PRN
  Administered 2024-01-01: 1 via TOPICAL

## 2024-01-01 MED ORDER — ORAL CARE MOUTH RINSE: 15.0000 mL | Freq: Once | OROMUCOSAL | Status: DC

## 2024-01-01 MED ORDER — FENTANYL CITRATE (PF) 250 MCG/5ML IJ SOLN
INTRAMUSCULAR | Status: AC
  Filled 2024-01-01: qty 5

## 2024-01-01 MED ORDER — PROPOFOL 10 MG/ML IV BOLUS
INTRAVENOUS | Status: AC
  Filled 2024-01-01: qty 20

## 2024-01-01 MED ORDER — FENTANYL CITRATE PF 50 MCG/ML IJ SOSY
PREFILLED_SYRINGE | INTRAMUSCULAR | Status: AC
  Filled 2024-01-01: qty 1

## 2024-01-01 MED ORDER — KETOROLAC TROMETHAMINE 30 MG/ML IJ SOLN
INTRAMUSCULAR | Status: AC
Start: 2024-01-01 — End: 2024-01-01
  Filled 2024-01-01: qty 1

## 2024-01-01 MED ORDER — DEXMEDETOMIDINE HCL IN NACL 80 MCG/20ML IV SOLN
INTRAVENOUS | Status: AC
Start: 2024-01-01 — End: 2024-01-01
  Filled 2024-01-01: qty 20

## 2024-01-01 MED ORDER — SUGAMMADEX SODIUM 200 MG/2ML IV SOLN
INTRAVENOUS | Status: AC
  Filled 2024-01-01: qty 4

## 2024-01-01 MED ORDER — SUGAMMADEX SODIUM 200 MG/2ML IV SOLN
INTRAVENOUS | Status: DC | PRN
  Administered 2024-01-01: 400 mg via INTRAVENOUS

## 2024-01-01 MED ORDER — LIDOCAINE HCL URETHRAL/MUCOSAL 2 % EX GEL
CUTANEOUS | Status: AC
  Filled 2024-01-01: qty 30

## 2024-01-01 MED ORDER — SODIUM CHLORIDE 0.9 % IV SOLN
2.0000 g | INTRAVENOUS | Status: AC
  Administered 2024-01-01: 2 g via INTRAVENOUS
  Filled 2024-01-01: qty 20

## 2024-01-01 MED ORDER — OXYCODONE HCL 5 MG/5ML PO SOLN: 5.0000 mg | Freq: Once | ORAL | Status: AC | PRN

## 2024-01-01 MED ORDER — OXYCODONE HCL 5 MG PO TABS
5.0000 mg | ORAL_TABLET | Freq: Once | ORAL | Status: AC | PRN
  Administered 2024-01-01: 5 mg via ORAL

## 2024-01-01 MED ORDER — SODIUM CHLORIDE 0.9 % IV SOLN: INTRAVENOUS | Status: DC

## 2024-01-01 MED ORDER — LIDOCAINE HCL (PF) 2 % IJ SOLN
INTRAMUSCULAR | Status: AC
  Filled 2024-01-01: qty 5

## 2024-01-01 MED ORDER — MIDAZOLAM HCL 2 MG/2ML IJ SOLN
INTRAMUSCULAR | Status: AC
  Filled 2024-01-01: qty 2

## 2024-01-01 MED ORDER — FENTANYL CITRATE PF 50 MCG/ML IJ SOSY
25.0000 ug | PREFILLED_SYRINGE | INTRAMUSCULAR | Status: DC | PRN
  Administered 2024-01-01 (×3): 50 ug via INTRAVENOUS

## 2024-01-01 MED ORDER — PHENAZOPYRIDINE HCL 200 MG PO TABS
200.0000 mg | ORAL_TABLET | Freq: Three times a day (TID) | ORAL | 0 refills | Status: AC | PRN
Start: 2024-01-01 — End: 2024-12-31

## 2024-01-01 MED ORDER — DEXAMETHASONE SODIUM PHOSPHATE 10 MG/ML IJ SOLN
INTRAMUSCULAR | Status: DC | PRN
  Administered 2024-01-01: 4 mg via INTRAVENOUS

## 2024-01-01 MED ORDER — SUCCINYLCHOLINE CHLORIDE 200 MG/10ML IV SOSY
PREFILLED_SYRINGE | INTRAVENOUS | Status: DC | PRN
  Administered 2024-01-01: 140 mg via INTRAVENOUS

## 2024-01-01 MED ORDER — OXYCODONE HCL 5 MG PO TABS
ORAL_TABLET | ORAL | Status: AC
  Filled 2024-01-01: qty 1

## 2024-01-01 MED ORDER — LIDOCAINE 2% (20 MG/ML) 5 ML SYRINGE
INTRAMUSCULAR | Status: DC | PRN
  Administered 2024-01-01: 60 mg via INTRAVENOUS
  Administered 2024-01-01: 100 mg via INTRAVENOUS

## 2024-01-01 MED ORDER — CHLORHEXIDINE GLUCONATE 0.12 % MT SOLN: 15.0000 mL | Freq: Once | OROMUCOSAL | Status: DC

## 2024-01-01 MED ORDER — ROCURONIUM BROMIDE 10 MG/ML (PF) SYRINGE
PREFILLED_SYRINGE | INTRAVENOUS | Status: AC
  Filled 2024-01-01: qty 10

## 2024-01-01 MED ORDER — NITROFURANTOIN MONOHYD MACRO 100 MG PO CAPS: 100.0000 mg | ORAL_CAPSULE | Freq: Every day | ORAL | 0 refills | Status: DC

## 2024-01-01 MED ORDER — ROCURONIUM BROMIDE 100 MG/10ML IV SOLN
INTRAVENOUS | Status: DC | PRN
  Administered 2024-01-01: 40 mg via INTRAVENOUS

## 2024-01-01 MED ORDER — FENTANYL CITRATE PF 50 MCG/ML IJ SOSY
PREFILLED_SYRINGE | INTRAMUSCULAR | Status: AC
  Filled 2024-01-01: qty 2

## 2024-01-01 MED ORDER — PROPOFOL 10 MG/ML IV BOLUS
INTRAVENOUS | Status: DC | PRN
  Administered 2024-01-01: 100 mg via INTRAVENOUS
  Administered 2024-01-01: 250 mg via INTRAVENOUS

## 2024-01-01 MED ORDER — ONDANSETRON HCL 4 MG/2ML IJ SOLN
INTRAMUSCULAR | Status: DC | PRN
  Administered 2024-01-01: 4 mg via INTRAVENOUS

## 2024-01-01 MED ORDER — MIDAZOLAM HCL 2 MG/2ML IJ SOLN
INTRAMUSCULAR | Status: DC | PRN
  Administered 2024-01-01: 2 mg via INTRAVENOUS

## 2024-01-01 MED ORDER — IOHEXOL 300 MG/ML  SOLN
INTRAMUSCULAR | Status: DC | PRN
  Administered 2024-01-01: 10 mL

## 2024-01-01 MED ORDER — FENTANYL CITRATE (PF) 250 MCG/5ML IJ SOLN
INTRAMUSCULAR | Status: DC | PRN
  Administered 2024-01-01: 25 ug via INTRAVENOUS
  Administered 2024-01-01 (×3): 50 ug via INTRAVENOUS

## 2024-01-01 SURGICAL SUPPLY — 22 items
BAG COUNTER SPONGE SURGICOUNT (BAG) IMPLANT
BAG URO CATCHER STRL LF (MISCELLANEOUS) ×1 IMPLANT
BASKET ZERO TIP NITINOL 2.4FR (BASKET) IMPLANT
CATH URETL OPEN END 6FR 70 (CATHETERS) IMPLANT
CLOTH BEACON ORANGE TIMEOUT ST (SAFETY) ×1 IMPLANT
EXTRACTOR STONE NITINOL NGAGE (UROLOGICAL SUPPLIES) IMPLANT
FIBER LASER FLEXIVA PULSE EA (MISCELLANEOUS) IMPLANT
GLOVE SURG LX STRL 8.0 MICRO (GLOVE) ×1 IMPLANT
GOWN STRL REUS W/ TWL XL LVL3 (GOWN DISPOSABLE) ×1 IMPLANT
GUIDEWIRE STR DUAL SENSOR (WIRE) ×1 IMPLANT
GUIDEWIRE ZIPWRE .038 STRAIGHT (WIRE) IMPLANT
IV NS 1000ML BAXH (IV SOLUTION) ×1 IMPLANT
KIT TURNOVER KIT A (KITS) ×1 IMPLANT
MANIFOLD NEPTUNE II (INSTRUMENTS) ×1 IMPLANT
MAT PREVALON FULL STRYKER (MISCELLANEOUS) IMPLANT
PACK CYSTO (CUSTOM PROCEDURE TRAY) ×1 IMPLANT
SHEATH NAVIGATOR HD 12/14X36 (SHEATH) IMPLANT
SHEATH URETERAL FLEX 12X35 (SHEATH) IMPLANT
STENT URET 6FRX26 CONTOUR (STENTS) IMPLANT
TRACTIP FLEXIVA PULS ID 200XHI (Laser) IMPLANT
TUBING CONNECTING 10 (TUBING) ×1 IMPLANT
TUBING UROLOGY SET (TUBING) ×1 IMPLANT

## 2024-01-01 NOTE — Transfer of Care (Signed)
 Immediate Anesthesia Transfer of Care Note  Patient: Tracie Green  Procedure(s) Performed: Procedure(s): CYSTOSCOPY/URETEROSCOPY/HOLMIUM LASER/STENT PLACEMENT (Left)  Patient Location: PACU  Anesthesia Type:General  Level of Consciousness:  sedated, patient cooperative and responds to stimulation  Airway & Oxygen Therapy:Patient Spontanous Breathing and Patient connected to face mask oxgen  Post-op Assessment:  Report given to PACU RN and Post -op Vital signs reviewed and stable  Post vital signs:  Reviewed and stable  Last Vitals:  Vitals:   01/01/24 1219  BP: (!) 145/75  Pulse: 89  Resp: 16  Temp: 36.5 C  SpO2: 100%    Complications: No apparent anesthesia complications

## 2024-01-01 NOTE — Op Note (Signed)
 OPERATIVE NOTE   Patient Name: Tracie Green  MRN: [id number]   Date of Procedure: 01/01/24    Preoperative diagnosis:  Left ureteral calculus History of UTI   Postoperative diagnosis:  Left renal calculus History of UTI  Procedure:  Cystoscopy Left retrograde pyelogram Left ureteroscopic laser lithotripsy, stone obtained Exchange of left ureteral stent (28F x 26 cm, with tether)  Attending: Adine DOROTHA Manly, MD  Anesthesia:  General  Estimated blood loss: 5 ml  Fluids: Per anesthesia record  Drains: 28F x 26 cm left ureteral stent with tether  Specimens: stone fragments given to patient  Antibiotics: Rocephin 2 gm IV  Findings:  - normal bladder and urethra - calculus in left lower pole calyx and upper pole calyx  Indications:  48 year old female presented with left ureteral calculus and UTI in early August 2025.  CT imaging showed a 6.5 mm calculus in the left proximal ureter with associated obstruction.  She underwent cystoscopy with insertion of left ureteral stent on 11/07/2023.  Urine culture grew >100 K E. coli.  She was treated with IV antibiotics and discharged on oral antibiotics.  She presents now for definitive treatment of her left ureteral calculus with cystoscopy, left retrograde pyelogram, left ureteroscopic laser lithotripsy, and exchange of left ureteral stent.  The procedure including potential risk discussed with the patient.  She understands and wishes to proceed as described.  Description of Procedure:  The patient was taken to the operating room suite and properly identified.  She received IV Rocephin.  After successful induction of a general anesthetic, she was placed in the dorsolithotomy position.  The patient's genitalia is prepped and draped in sterile fashion.  A preoperative timeout was performed.  Under visualization, a 21 French rigid cystoscope was passed through the urethra into the bladder.  The left ureteral stent was identified.   Scout film showed the left ureteral stent in good position.  There appeared to be a calcification in the lower left renal shadow.  A sensor wire was passed alongside the left ureteral stent and into the left renal pelvis under fluoroscopic guidance.  Using stent graspers, the left ureteral stent was removed.  Ureteroscopy was then performed alongside the guidewire.  No calculi were seen within the left ureter.  A 11/32F Flexor access sheath was placed leaving the guidewire in place.  Flexible ureteroscopy was performed through the sheath and alongside the guidewire.  Inspection of the left renal pelvis and calyces demonstrated a stone in the left lower pole calyx and a small stone in the left upper pole calyx.  Using the holmium laser fiber, both stones were fragmented into multiple pieces.  I then used the N gage stone basket to remove the larger fragments.  Inspection of the left collecting system demonstrated no large fragments.  The left ureter was inspected with removal of the ureteroscope and access sheath.  There is no evidence of any ureteral injury.  A 6 French by 26 cm double-J stent was then passed over the guidewire.  Position was confirmed proximally and distally.  The tether was left attached and brought through the meatus.  The bladder was then drained and the cystoscope was removed.  Intraurethral lidocaine  jelly was placed.  The patient was then extubated and taken to the post anesthesia care unit in stable condition.  Complications: None  Condition: Stable, extubated, transferred to PACU  Plan:  Discharge to home Follow-up for stent removal in 1 week.

## 2024-01-01 NOTE — Interval H&P Note (Signed)
 History and Physical Interval Note:  01/01/2024 12:56 PM  Tracie Green  has presented today for surgery, with the diagnosis of Calculus of ureter.  The various methods of treatment have been discussed with the patient and family. After consideration of risks, benefits and other options for treatment, the patient has consented to  Procedure(s): CYSTOSCOPY/URETEROSCOPY/HOLMIUM LASER/STENT PLACEMENT (Left) as a surgical intervention.  The patient's history has been reviewed, patient examined, no change in status, stable for surgery.  I have reviewed the patient's chart and labs.  Questions were answered to the patient's satisfaction.     Adine Manly

## 2024-01-01 NOTE — Anesthesia Procedure Notes (Signed)
 Procedure Name: Intubation Date/Time: 01/01/2024 1:33 PM  Performed by: Augusta Daved SAILOR, CRNAPre-anesthesia Checklist: Patient identified, Emergency Drugs available, Suction available and Patient being monitored Patient Re-evaluated:Patient Re-evaluated prior to induction Oxygen Delivery Method: Circle System Utilized Preoxygenation: Pre-oxygenation with 100% oxygen Induction Type: IV induction Laryngoscope Size: Glidescope and 3 Grade View: Grade I Tube type: Oral Tube size: 7.0 mm Number of attempts: 1 Airway Equipment and Method: Stylet and Oral airway Placement Confirmation: ETT inserted through vocal cords under direct vision, positive ETCO2 and breath sounds checked- equal and bilateral Secured at: 21 (at the lip) cm Tube secured with: Tape Dental Injury: Teeth and Oropharynx as per pre-operative assessment

## 2024-01-02 ENCOUNTER — Other Ambulatory Visit: Payer: Self-pay | Admitting: Urology

## 2024-01-02 ENCOUNTER — Encounter (HOSPITAL_COMMUNITY): Payer: Self-pay | Admitting: Urology

## 2024-01-02 ENCOUNTER — Telehealth: Payer: Self-pay | Admitting: Urology

## 2024-01-02 DIAGNOSIS — N201 Calculus of ureter: Secondary | ICD-10-CM

## 2024-01-02 MED ORDER — OXYCODONE HCL 5 MG PO CAPS
10.0000 mg | ORAL_CAPSULE | Freq: Four times a day (QID) | ORAL | 0 refills | Status: AC | PRN
Start: 2024-01-02 — End: ?

## 2024-01-02 NOTE — Telephone Encounter (Signed)
 Patient called and lvm stated phenazopyridine (PYRIDIUM) 200 MG tablet is not touching her pain and discomfort. Pt stated she had cold chills and fever last night as well. Please Advise.

## 2024-01-02 NOTE — Telephone Encounter (Signed)
 Spoke with Dr. Roseann in reference to pt and pain. Dr. Roseann stated he was comfortable sending in a few pain pills d/t pt having ureteroscopy and not ESWL.  Spoke with pt and made aware of Dr. Annis orders. Reinforced with pt about getting a thermometer to ensure she does not having a fever r/t her chills. Pt voiced understanding stating she would call back with temp.

## 2024-01-02 NOTE — Telephone Encounter (Signed)
 Spoke with pt in reference to not feeling well post ESWL. Pt stated since last night she has been having chills, intermittent sweating, and severe nausea. Pt states that she does not have a thermometer therefore she is unsure if she has/had a temp. Pt states she took zofran  for nausea and it has helped some. Pt also c/o severe left sided pain, 10/10 pain scale. Pt states that the pain is in the kidney area where her stone was and is radiating down her left leg and into her back. Pt states the AZO is not helping with her pain. Denies dysuria. Reinforced with pt, AZO is more for dysuria and not kidney like pain. Pt states she has been in pain for so long that she now has a migraine. Pt requested a script for a thermometer and pain medication. Reinforced with pt if her pain is a 10/10 combined with chills, nausea, and possible fever, then she should go to the ER. Pt voiced understanding.

## 2024-01-04 NOTE — Anesthesia Postprocedure Evaluation (Signed)
 Anesthesia Post Note  Patient: Tracie Green  Procedure(s) Performed: CYSTOSCOPY/URETEROSCOPY/HOLMIUM LASER/STENT PLACEMENT (Left)     Patient location during evaluation: PACU Anesthesia Type: General Level of consciousness: awake and alert Pain management: pain level controlled Vital Signs Assessment: post-procedure vital signs reviewed and stable Respiratory status: spontaneous breathing, nonlabored ventilation, respiratory function stable and patient connected to nasal cannula oxygen Cardiovascular status: blood pressure returned to baseline and stable Postop Assessment: no apparent nausea or vomiting Anesthetic complications: no   No notable events documented.  Last Vitals:  Vitals:   01/01/24 1600 01/01/24 1605  BP: (!) 158/96 (!) 159/90  Pulse: 75 94  Resp: 10 20  Temp: (!) 36.4 C (!) 36.4 C  SpO2: 93% 97%    Last Pain:  Vitals:   01/01/24 1605  TempSrc: Temporal  PainSc: 0-No pain                 [address]

## 2024-01-09 ENCOUNTER — Ambulatory Visit: Payer: MEDICAID | Admitting: Urology

## 2024-01-09 NOTE — Progress Notes (Deleted)
 Assessment: 1. Ureteral calculus   2. Nephrolithiasis   3. History of UTI      Plan: Left ureteral stent removed today. Complete antibiotics. Return to office in 1 month.  Chief Complaint:  No chief complaint on file.   History of Present Illness:  Tracie Green is a 48 y.o. female who is seen for further evaluation of left ureteral calculus and UTI. She presented to the emergency room on 11/07/2023 with left flank pain.  She also developed a low-grade fever.  CT imaging showed a 6.5 mm calculus in the left proximal ureter with associated obstruction. She was taken to the operating room on 11/07/2023 where she underwent cystoscopy and insertion of a left ureteral stent. Urine culture grew >100 K E. coli. She was discharged on cephalexin .   She previously underwent right ureteroscopic laser lithotripsy in [place] in 2022.  She is status post left ureteroscopic laser lithotripsy and exchange of left ureteral stent on 01/01/2024.  She presents today for stent removal.  She has continued on daily Macrobid .  Portions of the above documentation were copied from a prior visit for review purposes only.    Past Medical History:  Past Medical History:  Diagnosis Date   Anemia    Anxiety    Arthritis    Depression    Fatty liver    GERD (gastroesophageal reflux disease)    Headache    migraines   History of kidney stones    Hypertension    PE (pulmonary embolism)    Sepsis (HCC) 11/2023   Stroke [facility])     Past Surgical History:  Past Surgical History:  Procedure Laterality Date   CESAREAN SECTION     x4   CHOLECYSTECTOMY  1998   CYSTOSCOPY WITH STENT PLACEMENT Left 11/07/2023   Procedure: CYSTOSCOPY, WITH STENT INSERTION;  Surgeon: Gaston Hamilton, MD;  [facility];  Service: Urology;  Laterality: Left;   CYSTOSCOPY/URETEROSCOPY/HOLMIUM LASER/STENT PLACEMENT Left 01/01/2024   Procedure: CYSTOSCOPY/URETEROSCOPY/HOLMIUM LASER/STENT PLACEMENT;  Surgeon:  Roseann Adine PARAS., MD;  Location[facility];  Service: Urology;  Laterality: Left;   INCISION AND DRAINAGE     right wrist   TONSILLECTOMY      Allergies:  Allergies  Allergen Reactions   Tramadol  Itching    edema   Penicillins Hives and Rash    Hives   Morphine  Nausea And Vomiting   Morphine  And Codeine  Nausea And Vomiting    Family History:  Family History  Problem Relation Age of Onset   Breast cancer Mother 96   Ovarian cancer Mother    BRCA 1/2 Neg Hx     Social History:  Social History   Tobacco Use   Smoking status: Never   Smokeless tobacco: Never  Vaping Use   Vaping status: Never Used  Substance Use Topics   Alcohol use: No   Drug use: No    ROS: Constitutional:  Negative for fever, chills, weight loss CV: Negative for chest pain, previous MI, hypertension Respiratory:  Negative for shortness of breath, wheezing, sleep apnea, frequent cough GI:  Negative for nausea, vomiting, bloody stool, GERD  Physical exam: LMP 12/04/2023 (Approximate) Comment: Pregnancy test Negative 11/07/2023 GENERAL APPEARANCE:  Well appearing, well developed, well nourished, NAD HEENT:  Atraumatic, normocephalic, oropharynx clear NECK:  Supple without lymphadenopathy or thyromegaly ABDOMEN:  Soft, non-tender, no masses EXTREMITIES:  Moves all extremities well, without clubbing, cyanosis, or edema NEUROLOGIC:  Alert and oriented x 3, normal gait, CN II-XII grossly intact  MENTAL STATUS:  appropriate BACK:  Non-tender to palpation, No CVAT SKIN:  Warm, dry, and intact  Results: U/A:

## 2024-01-09 NOTE — Progress Notes (Deleted)
 Assessment: 1. Ureteral calculus   2. Nephrolithiasis   3. History of UTI      Plan: Left ureteral stent removed today. Complete antibiotics. Return to office in 1 month.  Chief Complaint:  No chief complaint on file.   History of Present Illness:  Laporsche Dameron Reffner is a 48 y.o. female who is seen for further evaluation of left ureteral calculus and UTI. She presented to the emergency room on 11/07/2023 with left flank pain.  She also developed a low-grade fever.  CT imaging showed a 6.5 mm calculus in the left proximal ureter with associated obstruction. She was taken to the operating room on 11/07/2023 where she underwent cystoscopy and insertion of a left ureteral stent. Urine culture grew >100 K E. coli. She was discharged on cephalexin .   She previously underwent right ureteroscopic laser lithotripsy in [place] in 2022.  She is status post left ureteroscopic laser lithotripsy and exchange of left ureteral stent on 01/01/2024.  She presents today for stent removal.  She has continued on daily Macrobid .  Portions of the above documentation were copied from a prior visit for review purposes only.    Past Medical History:  Past Medical History:  Diagnosis Date   Anemia    Anxiety    Arthritis    Depression    Fatty liver    GERD (gastroesophageal reflux disease)    Headache    migraines   History of kidney stones    Hypertension    PE (pulmonary embolism)    Sepsis (HCC) 11/2023   Stroke [facility])     Past Surgical History:  Past Surgical History:  Procedure Laterality Date   CESAREAN SECTION     x4   CHOLECYSTECTOMY  1998   CYSTOSCOPY WITH STENT PLACEMENT Left 11/07/2023   Procedure: CYSTOSCOPY, WITH STENT INSERTION;  Surgeon: Gaston Hamilton, MD;  Location: [facility];  Service: Urology;  Laterality: Left;   CYSTOSCOPY/URETEROSCOPY/HOLMIUM LASER/STENT PLACEMENT Left 01/01/2024   Procedure: CYSTOSCOPY/URETEROSCOPY/HOLMIUM LASER/STENT PLACEMENT;  Surgeon:  Roseann Adine PARAS., MD;  Location: [facility];  Service: Urology;  Laterality: Left;   INCISION AND DRAINAGE     right wrist   TONSILLECTOMY      Allergies:  Allergies  Allergen Reactions   Tramadol  Itching    edema   Penicillins Hives and Rash    Hives   Morphine  Nausea And Vomiting   Morphine  And Codeine  Nausea And Vomiting    Family History:  Family History  Problem Relation Age of Onset   Breast cancer Mother 96   Ovarian cancer Mother    BRCA 1/2 Neg Hx     Social History:  Social History   Tobacco Use   Smoking status: Never   Smokeless tobacco: Never  Vaping Use   Vaping status: Never Used  Substance Use Topics   Alcohol use: No   Drug use: No    ROS: Constitutional:  Negative for fever, chills, weight loss CV: Negative for chest pain, previous MI, hypertension Respiratory:  Negative for shortness of breath, wheezing, sleep apnea, frequent cough GI:  Negative for nausea, vomiting, bloody stool, GERD  Physical exam: LMP 12/04/2023 (Approximate) Comment: Pregnancy test Negative 11/07/2023 GENERAL APPEARANCE:  Well appearing, well developed, well nourished, NAD HEENT:  Atraumatic, normocephalic, oropharynx clear NECK:  Supple without lymphadenopathy or thyromegaly ABDOMEN:  Soft, non-tender, no masses EXTREMITIES:  Moves all extremities well, without clubbing, cyanosis, or edema NEUROLOGIC:  Alert and oriented x 3, normal gait, CN II-XII grossly intact  MENTAL STATUS:  appropriate BACK:  Non-tender to palpation, No CVAT SKIN:  Warm, dry, and intact  Results: U/A:

## 2024-01-10 ENCOUNTER — Ambulatory Visit: Payer: MEDICAID | Admitting: Urology

## 2024-01-10 NOTE — Progress Notes (Deleted)
 Assessment: 1. Ureteral calculus   2. Nephrolithiasis   3. History of UTI      Plan: Left ureteral stent removed today. Complete antibiotics. Return to office in 1 month.  Chief Complaint:  No chief complaint on file.   History of Present Illness:  Tracie Green is a 48 y.o. female who is seen for further evaluation of left ureteral calculus and UTI. She presented to the emergency room on 11/07/2023 with left flank pain.  She also developed a low-grade fever.  CT imaging showed a 6.5 mm calculus in the left proximal ureter with associated obstruction. She was taken to the operating room on 11/07/2023 where she underwent cystoscopy and insertion of a left ureteral stent. Urine culture grew >100 K E. coli. She was discharged on cephalexin .    She is status post left ureteroscopic laser lithotripsy and exchange of left ureteral stent on 01/01/2024.  She presents today for stent removal.  She has continued on daily Macrobid .  Portions of the above documentation were copied from a prior visit for review purposes only.    Past Medical History:  Past Medical History:  Diagnosis Date   Anemia    Anxiety    Arthritis    Depression    Fatty liver    GERD (gastroesophageal reflux disease)    Headache    migraines   History of kidney stones    Hypertension    PE (pulmonary embolism)    Sepsis (HCC) 11/2023   Stroke [facility])     Past Surgical History:  Past Surgical History:  Procedure Laterality Date   CESAREAN SECTION     x4   CHOLECYSTECTOMY  1998   CYSTOSCOPY WITH STENT PLACEMENT Left 11/07/2023   Procedure: CYSTOSCOPY, WITH STENT INSERTION;  Surgeon: Gaston Hamilton, MD;  Location: [facility];  Service: Urology;  Laterality: Left;   CYSTOSCOPY/URETEROSCOPY/HOLMIUM LASER/STENT PLACEMENT Left 01/01/2024   Procedure: CYSTOSCOPY/URETEROSCOPY/HOLMIUM LASER/STENT PLACEMENT;  Surgeon: Roseann Adine PARAS., MD;  [facility];  Service: Urology;  Laterality: Left;    INCISION AND DRAINAGE     right wrist   TONSILLECTOMY      Allergies:  Allergies  Allergen Reactions   Tramadol  Itching    edema   Penicillins Hives and Rash    Hives   Morphine  Nausea And Vomiting   Morphine  And Codeine  Nausea And Vomiting    Family History:  Family History  Problem Relation Age of Onset   Breast cancer Mother 47   Ovarian cancer Mother    BRCA 1/2 Neg Hx     Social History:  Social History   Tobacco Use   Smoking status: Never   Smokeless tobacco: Never  Vaping Use   Vaping status: Never Used  Substance Use Topics   Alcohol use: No   Drug use: No    ROS: Constitutional:  Negative for fever, chills, weight loss CV: Negative for chest pain, previous MI, hypertension Respiratory:  Negative for shortness of breath, wheezing, sleep apnea, frequent cough GI:  Negative for nausea, vomiting, bloody stool, GERD  Physical exam: LMP 12/04/2023 (Approximate) Comment: Pregnancy test Negative 11/07/2023 GENERAL APPEARANCE:  Well appearing, well developed, well nourished, NAD HEENT:  Atraumatic, normocephalic, oropharynx clear NECK:  Supple without lymphadenopathy or thyromegaly ABDOMEN:  Soft, non-tender, no masses EXTREMITIES:  Moves all extremities well, without clubbing, cyanosis, or edema NEUROLOGIC:  Alert and oriented x 3, normal gait, CN II-XII grossly intact MENTAL STATUS:  appropriate BACK:  Non-tender to palpation, No CVAT  SKIN:  Warm, dry, and intact  Results: U/A:

## 2024-01-14 ENCOUNTER — Telehealth: Payer: Self-pay | Admitting: Urology

## 2024-01-14 NOTE — Telephone Encounter (Signed)
-----   Message from Adine Manly sent at 01/14/2024 12:36 PM EDT ----- Please contact patient to schedule her an appointment for stent removal.

## 2024-01-14 NOTE — Telephone Encounter (Signed)
 Called pt no anwser no voicemail will try again.

## 2024-01-23 ENCOUNTER — Ambulatory Visit (INDEPENDENT_AMBULATORY_CARE_PROVIDER_SITE_OTHER): Payer: MEDICAID | Admitting: Urology

## 2024-01-23 ENCOUNTER — Encounter: Payer: Self-pay | Admitting: Urology

## 2024-01-23 ENCOUNTER — Ambulatory Visit (HOSPITAL_BASED_OUTPATIENT_CLINIC_OR_DEPARTMENT_OTHER)
Admission: RE | Admit: 2024-01-23 | Discharge: 2024-01-23 | Disposition: A | Discharge status: Home / Self Care | Payer: MEDICAID | Source: Ambulatory Visit | Attending: Urology | Admitting: Urology

## 2024-01-23 VITALS — BP 169/104 | HR 99 | Ht 69.0 in | Wt 363.0 lb

## 2024-01-23 DIAGNOSIS — N201 Calculus of ureter: Secondary | ICD-10-CM | POA: Diagnosis present

## 2024-01-23 DIAGNOSIS — Z87440 Personal history of urinary (tract) infections: Secondary | ICD-10-CM

## 2024-01-23 DIAGNOSIS — R8290 Unspecified abnormal findings in urine: Secondary | ICD-10-CM

## 2024-01-23 LAB — MICROSCOPIC EXAMINATION

## 2024-01-23 LAB — URINALYSIS, ROUTINE W REFLEX MICROSCOPIC
Bilirubin, UA: NEGATIVE
Glucose, UA: NEGATIVE
Ketones, UA: NEGATIVE
Leukocytes,UA: NEGATIVE
Nitrite, UA: POSITIVE — AB
Protein,UA: NEGATIVE
RBC, UA: NEGATIVE
Specific Gravity, UA: 1.025 (ref 1.005–1.030)
Urobilinogen, Ur: 0.2 mg/dL (ref 0.2–1.0)
pH, UA: 5.5 (ref 5.0–7.5)

## 2024-01-23 MED ORDER — SULFAMETHOXAZOLE-TRIMETHOPRIM 800-160 MG PO TABS: 1.0000 | ORAL_TABLET | Freq: Two times a day (BID) | ORAL | 0 refills | Status: AC

## 2024-01-23 NOTE — Progress Notes (Signed)
 Assessment: 1. Ureteral calculus   2. History of UTI   3. Abnormal urine findings     Plan: Urine culture sent today. Begin Bactrim  DS twice daily x 5 days.  Prescription sent. KUB today. Stone prevention discussed with the patient.  Information provided. Schedule for renal U/S  - will call with results Return to office as needed.  Chief Complaint:  Chief Complaint  Patient presents with   Nephrolithiasis    History of Present Illness:  Tracie Green is a 48 y.o. female who is seen for further evaluation of left ureteral calculus and UTI. She presented to the emergency room on 11/07/2023 with left flank pain.  She also developed a low-grade fever.  CT imaging showed a 6.5 mm calculus in the left proximal ureter with associated obstruction. She was taken to the operating room on 11/07/2023 where she underwent cystoscopy and insertion of a left ureteral stent. Urine culture grew >100 K E. coli. She was discharged on cephalexin .    She is status post left ureteroscopic laser lithotripsy and exchange of left ureteral stent on 01/01/2024.    She reports that the stent was inadvertently removed approximately 1 week after her surgery.  She completed her antibiotics approximately 2 weeks ago.  She is not having any flank pain.  She is having frequency.  No dysuria or gross hematuria.  Portions of the above documentation were copied from a prior visit for review purposes only.   Past Medical History:  Past Medical History:  Diagnosis Date   Anemia    Anxiety    Arthritis    Depression    Fatty liver    GERD (gastroesophageal reflux disease)    Headache    migraines   History of kidney stones    Hypertension    PE (pulmonary embolism)    Sepsis (HCC) 11/2023   Stroke [facility])     Past Surgical History:  Past Surgical History:  Procedure Laterality Date   CESAREAN SECTION     x4   CHOLECYSTECTOMY  1998   CYSTOSCOPY WITH STENT PLACEMENT Left 11/07/2023   Procedure:  CYSTOSCOPY, WITH STENT INSERTION;  Surgeon: Gaston Hamilton, MD;  [facility];  Service: Urology;  Laterality: Left;   CYSTOSCOPY/URETEROSCOPY/HOLMIUM LASER/STENT PLACEMENT Left 01/01/2024   Procedure: CYSTOSCOPY/URETEROSCOPY/HOLMIUM LASER/STENT PLACEMENT;  Surgeon: Roseann Adine PARAS., MD;  [facility];  Service: Urology;  Laterality: Left;   INCISION AND DRAINAGE     right wrist   TONSILLECTOMY      Allergies:  Allergies  Allergen Reactions   Tramadol  Itching    edema   Penicillins Hives and Rash    Hives   Morphine  Nausea And Vomiting   Morphine  And Codeine  Nausea And Vomiting    Family History:  Family History  Problem Relation Age of Onset   Breast cancer Mother 77   Ovarian cancer Mother    BRCA 1/2 Neg Hx     Social History:  Social History   Tobacco Use   Smoking status: Never   Smokeless tobacco: Never  Vaping Use   Vaping status: Never Used  Substance Use Topics   Alcohol use: No   Drug use: No    ROS: Constitutional:  Negative for fever, chills, weight loss CV: Negative for chest pain, previous MI, hypertension Respiratory:  Negative for shortness of breath, wheezing, sleep apnea, frequent cough GI:  Negative for nausea, vomiting, bloody stool, GERD  Physical exam: BP (!) 169/104   Pulse 99  Ht 5' 9 (1.753 m)   Wt (!) 363 lb (164.7 kg)   LMP 12/04/2023 (Approximate) Comment: Pregnancy test Negative 11/07/2023  BMI 53.61 kg/m  GENERAL APPEARANCE:  Well appearing, well developed, well nourished, NAD HEENT:  Atraumatic, normocephalic, oropharynx clear NECK:  Supple without lymphadenopathy or thyromegaly ABDOMEN:  Soft, non-tender, no masses EXTREMITIES:  Moves all extremities well, without clubbing, cyanosis, or edema NEUROLOGIC:  Alert and oriented x 3, normal gait, CN II-XII grossly intact MENTAL STATUS:  appropriate BACK:  Non-tender to palpation, No CVAT SKIN:  Warm, dry, and intact  Results: U/A: 0-5 WBCs, 0-2 RBCs, many  bacteria, nitrite positive

## 2024-01-24 NOTE — Addendum Note (Signed)
 Addended by: OBADIAH ROSELEE RAMAN on: 01/24/2024 10:00 AM   Modules accepted: Orders

## 2024-01-25 ENCOUNTER — Ambulatory Visit: Payer: Self-pay | Admitting: Urology

## 2024-01-25 LAB — URINE CULTURE

## 2024-02-04 ENCOUNTER — Telehealth: Payer: Self-pay | Admitting: Urology

## 2024-02-04 LAB — STONE ANALYSIS
Calcium Oxalate Dihydrate: 20 %
Calcium Oxalate Monohydrate: 80 %
Weight Calculi: 44 mg

## 2024-02-04 NOTE — Telephone Encounter (Signed)
 Patient called and is experiencing pain in her left side and nausea. Please advise on what patient can do. She stated they could not schedule Renal US  until the 20th.

## 2024-02-04 NOTE — Telephone Encounter (Signed)
 Returned pts call, unable to leave a VM.

## 2024-02-05 NOTE — Telephone Encounter (Signed)
 Attempted to reach pt again unsuccessfully, voice mailbox full.

## 2024-02-12 ENCOUNTER — Emergency Department (HOSPITAL_COMMUNITY)
Admission: EM | Admit: 2024-02-12 | Discharge: 2024-02-13 | Disposition: A | Discharge status: Home / Self Care | Payer: MEDICAID | Attending: Emergency Medicine | Admitting: Emergency Medicine

## 2024-02-12 ENCOUNTER — Emergency Department (HOSPITAL_COMMUNITY): Payer: MEDICAID

## 2024-02-12 ENCOUNTER — Encounter (HOSPITAL_COMMUNITY): Payer: Self-pay

## 2024-02-12 ENCOUNTER — Other Ambulatory Visit: Payer: Self-pay

## 2024-02-12 DIAGNOSIS — R10A2 Flank pain, left side: Secondary | ICD-10-CM

## 2024-02-12 DIAGNOSIS — R7989 Other specified abnormal findings of blood chemistry: Secondary | ICD-10-CM | POA: Insufficient documentation

## 2024-02-12 DIAGNOSIS — Z7982 Long term (current) use of aspirin: Secondary | ICD-10-CM | POA: Diagnosis not present

## 2024-02-12 DIAGNOSIS — I10 Essential (primary) hypertension: Secondary | ICD-10-CM | POA: Insufficient documentation

## 2024-02-12 DIAGNOSIS — Z79899 Other long term (current) drug therapy: Secondary | ICD-10-CM | POA: Insufficient documentation

## 2024-02-12 DIAGNOSIS — Z8673 Personal history of transient ischemic attack (TIA), and cerebral infarction without residual deficits: Secondary | ICD-10-CM | POA: Insufficient documentation

## 2024-02-12 DIAGNOSIS — N390 Urinary tract infection, site not specified: Secondary | ICD-10-CM | POA: Insufficient documentation

## 2024-02-12 DIAGNOSIS — R112 Nausea with vomiting, unspecified: Secondary | ICD-10-CM | POA: Diagnosis present

## 2024-02-12 LAB — URINALYSIS, ROUTINE W REFLEX MICROSCOPIC
Bilirubin Urine: NEGATIVE
Glucose, UA: NEGATIVE mg/dL
Hgb urine dipstick: NEGATIVE
Ketones, ur: NEGATIVE mg/dL
Nitrite: POSITIVE — AB
Protein, ur: NEGATIVE mg/dL
Specific Gravity, Urine: 1.017 (ref 1.005–1.030)
pH: 5 (ref 5.0–8.0)

## 2024-02-12 LAB — CBC
HCT: 38.6 % (ref 36.0–46.0)
Hemoglobin: 12.3 g/dL (ref 12.0–15.0)
MCH: 26.6 pg (ref 26.0–34.0)
MCHC: 31.9 g/dL (ref 30.0–36.0)
MCV: 83.4 fL (ref 80.0–100.0)
Platelets: 303 K/uL (ref 150–400)
RBC: 4.63 MIL/uL (ref 3.87–5.11)
RDW: 14.6 % (ref 11.5–15.5)
WBC: 7.6 K/uL (ref 4.0–10.5)
nRBC: 0 % (ref 0.0–0.2)

## 2024-02-12 LAB — PREGNANCY, URINE: Preg Test, Ur: NEGATIVE

## 2024-02-12 LAB — COMPREHENSIVE METABOLIC PANEL WITH GFR
ALT: 12 U/L (ref 0–44)
AST: 20 U/L (ref 15–41)
Albumin: 3.7 g/dL (ref 3.5–5.0)
Alkaline Phosphatase: 149 U/L — ABNORMAL HIGH (ref 38–126)
Anion gap: 10 (ref 5–15)
BUN: 15 mg/dL (ref 6–20)
CO2: 26 mmol/L (ref 22–32)
Calcium: 9 mg/dL (ref 8.9–10.3)
Chloride: 106 mmol/L (ref 98–111)
Creatinine, Ser: 1.09 mg/dL — ABNORMAL HIGH (ref 0.44–1.00)
GFR, Estimated: >60 mL/min (ref >60)
Glucose, Bld: 113 mg/dL — ABNORMAL HIGH (ref 70–99)
Potassium: 3.7 mmol/L (ref 3.5–5.1)
Sodium: 142 mmol/L (ref 135–145)
Total Bilirubin: 0.3 mg/dL (ref 0.0–1.2)
Total Protein: 7.2 g/dL (ref 6.5–8.1)

## 2024-02-12 LAB — LIPASE, BLOOD: Lipase: 79 U/L — ABNORMAL HIGH (ref 11–51)

## 2024-02-12 MED ORDER — KETOROLAC TROMETHAMINE 15 MG/ML IJ SOLN
15.0000 mg | Freq: Once | INTRAMUSCULAR | Status: AC
  Administered 2024-02-12: 15 mg via INTRAVENOUS
  Filled 2024-02-12: qty 1

## 2024-02-12 MED ORDER — ONDANSETRON HCL 4 MG/2ML IJ SOLN
4.0000 mg | Freq: Once | INTRAMUSCULAR | Status: AC
  Administered 2024-02-12: 4 mg via INTRAVENOUS
  Filled 2024-02-12: qty 2

## 2024-02-12 MED ORDER — HYDROMORPHONE HCL 1 MG/ML IJ SOLN
0.5000 mg | Freq: Once | INTRAMUSCULAR | Status: AC
  Administered 2024-02-12: 0.5 mg via INTRAVENOUS
  Filled 2024-02-12: qty 0.5

## 2024-02-12 MED ORDER — SODIUM CHLORIDE 0.9 % IV SOLN
1.0000 g | Freq: Once | INTRAVENOUS | Status: AC
  Administered 2024-02-12: 1 g via INTRAVENOUS
  Filled 2024-02-12: qty 10

## 2024-02-12 MED ORDER — LACTATED RINGERS IV BOLUS
1000.0000 mL | Freq: Once | INTRAVENOUS | Status: AC
  Administered 2024-02-12: 1000 mL via INTRAVENOUS

## 2024-02-12 NOTE — ED Triage Notes (Signed)
 Pt to ED with c/o N/V/D that started yesterday, pt reports she has had kidney stones recently treated with lithotripsy

## 2024-02-12 NOTE — ED Provider Notes (Signed)
 [place] EMERGENCY DEPARTMENT AT [facility] Provider Note   CSN: [id number] Arrival date & time: 02/12/24  2053     Patient presents with: Emesis   Tracie Green is a 48 y.o. female.  {Add pertinent medical, surgical, social history, OB history to HPI:32947}  Emesis Associated symptoms: diarrhea and fever   Patient presents for left flank pain, nausea, vomiting.  Medical history includes HTN, CVA, PE, nephrolithiasis, anxiety, depression, arthritis, anemia.  She has recently been treated for left ureteral calculus.  She underwent stent placement on 8/6.  On 9/30, she underwent laser lithotripsy with ureteral stent exchange.  Stent was inadvertently removed 1 week after that procedure.  She was treated for UTI with Bactrim  2 weeks ago.  For the past 24 hours, she has had left flank pain, subjective fevers, nausea, vomiting, and diarrhea. She has not taken anything for pain or nausea.    Prior to Admission medications   Medication Sig Start Date End Date Taking? Authorizing Provider  albuterol  (VENTOLIN  HFA) 108 (90 Base) MCG/ACT inhaler Inhale 2 puffs into the lungs every 4 (four) hours as needed. 02/16/23   Stuart Vernell Norris, PA-C  allopurinol  (ZYLOPRIM ) 100 MG tablet Take 100 mg by mouth 2 (two) times daily. 08/13/23   [provider]  ALPRAZolam  (XANAX ) 0.5 MG tablet Take 0.5 mg by mouth 2 (two) times daily. 10/17/23   [provider]  aspirin -acetaminophen -caffeine  (EXCEDRIN  MIGRAINE) 250-250-65 MG tablet Take 1 tablet by mouth daily as needed for headache or migraine.    [provider]  atorvastatin  (LIPITOR) 40 MG tablet Take 1 tablet (40 mg total) by mouth daily. 11/14/23   Will Norris MATSU, MD  cyanocobalamin (VITAMIN B12) 1000 MCG/ML injection Inject 1,000 mcg into the muscle every 30 (thirty) days.    [provider]  famotidine  (PEPCID ) 20 MG tablet Take 1 tablet (20 mg total) by mouth daily for 30 doses. 08/29/22  12/05/23  Suellen Cantor A, PA-C  gabapentin  (NEURONTIN ) 300 MG capsule Take 300 mg by mouth daily as needed (nerve pain).    [provider]  hyoscyamine  (LEVSIN  SL) 0.125 MG SL tablet Place 1 tablet (0.125 mg total) under the tongue 3 (three) times daily. 11/14/23   Will Norris MATSU, MD  levocetirizine (XYZAL ) 5 MG tablet Take 1 tablet (5 mg total) by mouth every evening. Patient taking differently: Take 5 mg by mouth daily. 06/02/22   Bacchus, Meade PEDLAR, FNP  metoprolol  succinate (TOPROL -XL) 25 MG 24 hr tablet Take 0.5 tablets (12.5 mg total) by mouth daily. 11/14/23   Will Norris MATSU, MD  naloxone [facility]) nasal spray 4 mg/0.1 mL Place 0.4 mg into the nose as needed (od). 10/11/23   [provider]  ondansetron  (ZOFRAN -ODT) 4 MG disintegrating tablet Take 1 tablet (4 mg total) by mouth every 6 (six) hours as needed for nausea or vomiting. 08/29/22   Suellen Cantor A, PA-C  oxycodone  (OXY-IR) 5 MG capsule Take 2 capsules (10 mg total) by mouth every 6 (six) hours as needed. 01/02/24   Stoneking, Adine PARAS., MD  phenazopyridine (PYRIDIUM) 200 MG tablet Take 1 tablet (200 mg total) by mouth 3 (three) times daily as needed for pain. 01/01/24 12/31/24  Stoneking, Adine PARAS., MD  polyethylene glycol (MIRALAX  / GLYCOLAX ) 17 g packet Take 17 g by mouth 2 (two) times daily. 11/14/23   Will Norris MATSU, MD  senna-docusate (SENOKOT-S) 8.6-50 MG tablet Take 3 tablets by mouth at bedtime. 11/14/23   Will Norris  G, MD  tiZANidine  (ZANAFLEX ) 4 MG tablet TAKE 1 TABLET BY MOUTH EVERY 6 HOURS AS NEEDED FOR MUSCLE SPASMS. Patient taking differently: Take 6 mg by mouth at bedtime. 10/03/22   Bacchus, Meade PEDLAR, FNP  UBRELVY  100 MG TABS Take 100 mg by mouth as needed (migraines). 10/11/23   [provider]    Allergies: Tramadol , Penicillins, Morphine , and Morphine  and codeine     Review of Systems  Constitutional:  Positive for fever.  Gastrointestinal:  Positive for diarrhea,  nausea and vomiting.  Genitourinary:  Positive for flank pain.  All other systems reviewed and are negative.   Updated Vital Signs BP (!) 164/83   Pulse 89   Temp 98.8 F (37.1 C) (Oral)   Resp 18   Ht 5' 9 (1.753 m)   Wt (!) 164.7 kg   LMP 01/09/2024 (Approximate)   SpO2 100%   BMI 53.61 kg/m   Physical Exam Vitals and nursing note reviewed.  Constitutional:      General: She is not in acute distress.    Appearance: Normal appearance. She is well-developed. She is not ill-appearing, toxic-appearing or diaphoretic.  HENT:     Head: Normocephalic and atraumatic.     Right Ear: External ear normal.     Left Ear: External ear normal.     Nose: Nose normal.     Mouth/Throat:     Mouth: Mucous membranes are moist.  Eyes:     Extraocular Movements: Extraocular movements intact.     Conjunctiva/sclera: Conjunctivae normal.  Cardiovascular:     Rate and Rhythm: Normal rate and regular rhythm.  Pulmonary:     Effort: Pulmonary effort is normal. No respiratory distress.  Abdominal:     General: There is no distension.     Palpations: Abdomen is soft.     Tenderness: There is abdominal tenderness. There is no guarding or rebound.  Musculoskeletal:        General: No swelling.     Cervical back: Normal range of motion and neck supple.  Skin:    General: Skin is warm and dry.     Coloration: Skin is not jaundiced or pale.  Neurological:     General: No focal deficit present.     Mental Status: She is alert and oriented to person, place, and time.  Psychiatric:        Mood and Affect: Mood normal.        Behavior: Behavior normal.     (all labs ordered are listed, but only abnormal results are displayed) Labs Reviewed  LIPASE, BLOOD - Abnormal; Notable for the following components:      Result Value   Lipase 79 (*)    All other components within normal limits  COMPREHENSIVE METABOLIC PANEL WITH GFR - Abnormal; Notable for the following components:   Glucose, Bld 113  (*)    Creatinine, Ser 1.09 (*)    Alkaline Phosphatase 149 (*)    All other components within normal limits  URINALYSIS, ROUTINE W REFLEX MICROSCOPIC - Abnormal; Notable for the following components:   APPearance HAZY (*)    Nitrite POSITIVE (*)    Leukocytes,Ua MODERATE (*)    Bacteria, UA FEW (*)    All other components within normal limits  CBC  PREGNANCY, URINE    EKG: None  Radiology: No results found.  {Document cardiac monitor, telemetry assessment procedure when appropriate:32947} Procedures   Medications Ordered in the ED - No data to display    {Click  here for ABCD2, HEART and other calculators REFRESH Note before signing:1}                              Medical Decision Making Amount and/or Complexity of Data Reviewed Labs: ordered.   This patient presents to the ED for concern of ***, this involves an extensive number of treatment options, and is a complaint that carries with it a high risk of complications and morbidity.  The differential diagnosis includes ***   Co morbidities / Chronic conditions that complicate the patient evaluation  ***   Additional history obtained:  Additional history obtained from EMR External records from outside source obtained and reviewed including ***   Lab Tests:  I Ordered, and personally interpreted labs.  The pertinent results include:  ***   Imaging Studies ordered:  I ordered imaging studies including ***  I independently visualized and interpreted imaging which showed *** I agree with the radiologist interpretation   Cardiac Monitoring: / EKG:  The patient was maintained on a cardiac monitor.  I personally viewed and interpreted the cardiac monitored which showed an underlying rhythm of: ***   Problem List / ED Course / Critical interventions / Medication management  Patient with recent history of left-sided nephrolithiasis presenting to the ED for recurrence of left flank pain, nausea, vomiting,  subjective fever.  Symptoms are reminiscent of prior obstructive uropathy.  Patient is afebrile on arrival.  Vital signs notable for hypertension.  She is overall well-appearing on exam.  Workup was initiated.*** I ordered medication including ***   Reevaluation of the patient after these medicines showed that the patient *** I have reviewed the patients home medicines and have made adjustments as needed   Consultations Obtained:  I requested consultation with the ***,  and discussed lab and imaging findings as well as pertinent plan - they recommend: ***   Social Determinants of Health:  ***   Test / Admission - Considered:  ***   {Document critical care time when appropriate  Document review of labs and clinical decision tools ie CHADS2VASC2, etc  Document your independent review of radiology images and any outside records  Document your discussion with family members, caretakers and with consultants  Document social determinants of health affecting pt's care  Document your decision making why or why not admission, treatments were needed:32947:::1}   Final diagnoses:  None    ED Discharge Orders     None

## 2024-02-13 ENCOUNTER — Encounter: Payer: Self-pay | Admitting: Urology

## 2024-02-13 ENCOUNTER — Ambulatory Visit: Payer: MEDICAID | Admitting: Urology

## 2024-02-13 VITALS — BP 146/89 | HR 96

## 2024-02-13 DIAGNOSIS — Z87440 Personal history of urinary (tract) infections: Secondary | ICD-10-CM

## 2024-02-13 DIAGNOSIS — N200 Calculus of kidney: Secondary | ICD-10-CM | POA: Diagnosis not present

## 2024-02-13 DIAGNOSIS — N3941 Urge incontinence: Secondary | ICD-10-CM | POA: Insufficient documentation

## 2024-02-13 DIAGNOSIS — N3281 Overactive bladder: Secondary | ICD-10-CM | POA: Insufficient documentation

## 2024-02-13 LAB — URINALYSIS, ROUTINE W REFLEX MICROSCOPIC
Bilirubin, UA: NEGATIVE
Glucose, UA: NEGATIVE
Ketones, UA: NEGATIVE
Leukocytes,UA: NEGATIVE
Nitrite, UA: NEGATIVE
RBC, UA: NEGATIVE
Specific Gravity, UA: >=1.03 — AB (ref 1.005–1.030)
Urobilinogen, Ur: 0.2 mg/dL (ref 0.2–1.0)
pH, UA: 5 (ref 5.0–7.5)

## 2024-02-13 LAB — MICROSCOPIC EXAMINATION

## 2024-02-13 MED ORDER — OXYCODONE-ACETAMINOPHEN 5-325 MG PO TABS
1.0000 | ORAL_TABLET | Freq: Once | ORAL | Status: AC
  Administered 2024-02-13: 1 via ORAL
  Filled 2024-02-13: qty 1

## 2024-02-13 MED ORDER — METOCLOPRAMIDE HCL 5 MG/ML IJ SOLN
10.0000 mg | Freq: Once | INTRAMUSCULAR | Status: AC
Start: 2024-02-13 — End: 2024-02-13
  Administered 2024-02-13: 10 mg via INTRAVENOUS
  Filled 2024-02-13: qty 2

## 2024-02-13 MED ORDER — CEPHALEXIN 500 MG PO CAPS: 500.0000 mg | ORAL_CAPSULE | Freq: Four times a day (QID) | ORAL | 0 refills | Status: AC

## 2024-02-13 MED ORDER — SOLIFENACIN SUCCINATE 10 MG PO TABS: 10.0000 mg | ORAL_TABLET | Freq: Every day | ORAL | 11 refills | Status: AC

## 2024-02-13 MED ORDER — ONDANSETRON 4 MG PO TBDP: 4.0000 mg | ORAL_TABLET | Freq: Three times a day (TID) | ORAL | 0 refills | Status: AC | PRN

## 2024-02-13 NOTE — Progress Notes (Signed)
 Assessment: 1. Nephrolithiasis   2. History of UTI   3. OAB (overactive bladder)   4. Urge incontinence     Plan: I reviewed the records from her ER visit yesterday. I personally reviewed the CT study from 02/12/2024 with results as noted below. Recommend that she complete the antibiotics as prescribed. Await results of urine culture Stone prevention discussed and information provided  Diagnosis and management of OAB/urge incontinence discussed with the patient. She would like to begin medical therapy. Begin solifenacin 10 mg daily.  Use and side effects discussed. Return to office in 6 weeks.  Chief Complaint:  Chief Complaint  Patient presents with   Nephrolithiasis    History of Present Illness:  Tracie Green is a 48 y.o. female who is seen for further evaluation of left ureteral calculus and UTI. She presented to the emergency room on 11/07/2023 with left flank pain.  She also developed a low-grade fever.  CT imaging showed a 6.5 mm calculus in the left proximal ureter with associated obstruction. She was taken to the operating room on 11/07/2023 where she underwent cystoscopy and insertion of a left ureteral stent. Urine culture grew >100 K E. coli. She was discharged on cephalexin .    She is status post left ureteroscopic laser lithotripsy and exchange of left ureteral stent on 01/01/2024.    Her stent was inadvertently removed approximately 1 week after her surgery.  She completed her antibiotics approximately 2 weeks prior to her visit on 01/23/24.  Urine culture from 01/23/2024 showed greater than 2 organisms.  She was treated with Bactrim  x 5 days. Stone analysis showed 80% calcium  oxalate monohydrate and 20% calcium  oxalate dihydrate.  She was seen at [facility], ER yesterday for evaluation of nausea with vomiting and flank pain. CT renal stone study showed a 3 mm nonobstructing right renal calculus, no ureteral calculi, no evidence of obstruction, and left  renal cyst. Urinalysis was nitrite positive with 6-10 WBCs, few bacteria. She was treated with Rocephin and started on cephalexin .  Urine culture pending. She is feeling slightly better today.  She continues with frequency, urgency, and urge incontinence.  She reports the symptoms for some time with recent worsening.  She is not having dysuria or gross hematuria.  She continues to have some left-sided flank pain.  She has not started the oral antibiotics yet.  Portions of the above documentation were copied from a prior visit for review purposes only.   Past Medical History:  Past Medical History:  Diagnosis Date   Anemia    Anxiety    Arthritis    Depression    Fatty liver    GERD (gastroesophageal reflux disease)    Headache    migraines   History of kidney stones    Hypertension    PE (pulmonary embolism)    Sepsis (HCC) 11/2023   Stroke [facility])     Past Surgical History:  Past Surgical History:  Procedure Laterality Date   CESAREAN SECTION     x4   CHOLECYSTECTOMY  1998   CYSTOSCOPY WITH STENT PLACEMENT Left 11/07/2023   Procedure: CYSTOSCOPY, WITH STENT INSERTION;  Surgeon: Gaston Hamilton, MD;  Location: [facility];  Service: Urology;  Laterality: Left;   CYSTOSCOPY/URETEROSCOPY/HOLMIUM LASER/STENT PLACEMENT Left 01/01/2024   Procedure: CYSTOSCOPY/URETEROSCOPY/HOLMIUM LASER/STENT PLACEMENT;  Surgeon: Roseann Adine PARAS., MD;  Location: [facility];  Service: Urology;  Laterality: Left;   INCISION AND DRAINAGE     right wrist   TONSILLECTOMY  Allergies:  Allergies  Allergen Reactions   Tramadol  Itching    edema   Penicillins Hives and Rash    Hives   Morphine  Nausea And Vomiting   Morphine  And Codeine  Nausea And Vomiting    Family History:  Family History  Problem Relation Age of Onset   Breast cancer Mother 16   Ovarian cancer Mother    BRCA 1/2 Neg Hx     Social History:  Social History   Tobacco Use   Smoking status: Never   Smokeless tobacco:  Never  Vaping Use   Vaping status: Never Used  Substance Use Topics   Alcohol use: No   Drug use: No    ROS: Constitutional:  Negative for fever, chills, weight loss CV: Negative for chest pain, previous MI, hypertension Respiratory:  Negative for shortness of breath, wheezing, sleep apnea, frequent cough GI:  Negative for nausea, vomiting, bloody stool, GERD  Physical exam: BP (!) 146/89   Pulse 96   LMP 01/09/2024 (Approximate)  GENERAL APPEARANCE:  Well appearing, well developed, well nourished, NAD HEENT:  Atraumatic, normocephalic, oropharynx clear NECK:  Supple without lymphadenopathy or thyromegaly ABDOMEN:  Soft, non-tender, no masses EXTREMITIES:  Moves all extremities well, without clubbing, cyanosis, or edema NEUROLOGIC:  Alert and oriented x 3, normal gait, CN II-XII grossly intact MENTAL STATUS:  appropriate BACK:  Non-tender to palpation, No CVAT SKIN:  Warm, dry, and intact  Results: U/A: 11-30 WBC, 0-2 RBC, mod bacteria

## 2024-02-13 NOTE — Discharge Instructions (Addendum)
 Prescriptions for antibiotic and nausea medicine were sent to your pharmacy.  Take antibiotic as prescribed.  Follow-up with your urologist for results of urine cultures.  Take ondansetron  as needed for nausea.  Drink plenty of fluids to stay hydrated.  Treat pain with your home pain medication.  Return to the emergency department for any new or worsening symptoms of concern.

## 2024-02-15 LAB — URINE CULTURE: Culture: >=100000 — AB

## 2024-02-16 ENCOUNTER — Telehealth (HOSPITAL_BASED_OUTPATIENT_CLINIC_OR_DEPARTMENT_OTHER): Payer: Self-pay | Admitting: *Deleted

## 2024-02-16 NOTE — Telephone Encounter (Signed)
 Post ED Visit - Positive Culture Follow-up  Culture report reviewed by antimicrobial stewardship pharmacist: [facility] []  Rankin Dee, Pharm.D. []  Venetia Gully, Pharm.D., BCPS AQ-ID []  Garrel Crews, Pharm.D., BCPS []  Almarie Lunger, [address].D., BCPS []  [place], [address].D., BCPS, AAHIVP []  Rosaline Bihari, Pharm.D., BCPS, AAHIVP []  Vernell Meier, PharmD, BCPS []  Latanya Hint, PharmD, BCPS []  Donald Medley, PharmD, BCPS []  Rocky Bold, PharmD []  Dorothyann Alert, PharmD, BCPS [x]  Dorn Poot,  PharmD  [facility] []  Rosaline Edison, PharmD []  Romona Bliss, PharmD []  Dolphus Roller, PharmD []  Veva Seip, Rph []  Vernell Daunt) Leonce, PharmD []  Eva Allis, PharmD []  Rosaline Millet, PharmD []  Iantha Batch, PharmD []  Arvin Gauss, PharmD []  Wanda Hasting, PharmD []  Ronal Rav, PharmD []  Rocky Slade, PharmD []  Bard Jeans, PharmD   Positive urine culture Treated with Cephalexin , organism sensitive to the same and no further patient follow-up is required at this time.  Albino Alan Novak 02/16/2024, 10:33 AM

## 2024-02-22 ENCOUNTER — Ambulatory Visit (HOSPITAL_COMMUNITY): Payer: MEDICAID

## 2024-04-02 ENCOUNTER — Ambulatory Visit: Payer: MEDICAID | Admitting: Urology

## 2024-04-02 NOTE — Progress Notes (Deleted)
 "  Assessment: 1. Nephrolithiasis   2. History of UTI   3. OAB (overactive bladder)     Plan: Stone prevention discussed and information provided  Diagnosis and management of OAB/urge incontinence discussed with the patient. She would like to begin medical therapy. Begin solifenacin  10 mg daily.  Use and side effects discussed. Return to office in 6 weeks.  Chief Complaint:  No chief complaint on file.   History of Present Illness:  Tracie Green is a 48 y.o. female who is seen for further evaluation of left ureteral calculus and UTI. She presented to the emergency room on 11/07/2023 with left flank pain.  She also developed a low-grade fever.  CT imaging showed a 6.5 mm calculus in the left proximal ureter with associated obstruction. She was taken to the operating room on 11/07/2023 where she underwent cystoscopy and insertion of a left ureteral stent. Urine culture grew >100 K E. coli. She was discharged on cephalexin .    She is status post left ureteroscopic laser lithotripsy and exchange of left ureteral stent on 01/01/2024.    Her stent was inadvertently removed approximately 1 week after her surgery.  She completed her antibiotics approximately 2 weeks prior to her visit on 01/23/24.  Urine culture from 01/23/2024 showed greater than 2 organisms.  She was treated with Bactrim  x 5 days. Stone analysis showed 80% calcium  oxalate monohydrate and 20% calcium  oxalate dihydrate.  She was seen at [facility] ER on 02/12/24 for evaluation of nausea with vomiting and flank pain. CT renal stone study showed a 3 mm nonobstructing right renal calculus, no ureteral calculi, no evidence of obstruction, and left renal cyst. Urinalysis was nitrite positive with 6-10 WBCs, few bacteria. She was treated with Rocephin  and started on cephalexin .   Urine culture grew >100 K Klebsiella.   She was feeling slightly better at her visit on 02/13/2024.  She continued with frequency, urgency, and  urge incontinence.  She reported the symptoms for some time with recent worsening.  She was not having dysuria or gross hematuria.  She continued to have some left-sided flank pain.   She was given a trial of solifenacin  10 mg daily for her OAB symptoms.    Portions of the above documentation were copied from a prior visit for review purposes only.   Past Medical History:  Past Medical History:  Diagnosis Date   Anemia    Anxiety    Arthritis    Depression    Fatty liver    GERD (gastroesophageal reflux disease)    Headache    migraines   History of kidney stones    Hypertension    PE (pulmonary embolism)    Sepsis (HCC) 11/2023   Stroke [facility])     Past Surgical History:  Past Surgical History:  Procedure Laterality Date   CESAREAN SECTION     x4   CHOLECYSTECTOMY  1998   CYSTOSCOPY WITH STENT PLACEMENT Left 11/07/2023   Procedure: CYSTOSCOPY, WITH STENT INSERTION;  Surgeon: Gaston Hamilton, MD;  Location: [facility];  Service: Urology;  Laterality: Left;   CYSTOSCOPY/URETEROSCOPY/HOLMIUM LASER/STENT PLACEMENT Left 01/01/2024   Procedure: CYSTOSCOPY/URETEROSCOPY/HOLMIUM LASER/STENT PLACEMENT;  Surgeon: Roseann Adine PARAS., MD;  Location: [facility];  Service: Urology;  Laterality: Left;   INCISION AND DRAINAGE     right wrist   TONSILLECTOMY      Allergies:  Allergies  Allergen Reactions   Tramadol  Itching    edema   Penicillins Hives and Rash  Hives   Morphine  Nausea And Vomiting   Morphine  And Codeine  Nausea And Vomiting    Family History:  Family History  Problem Relation Age of Onset   Breast cancer Mother 23   Ovarian cancer Mother    BRCA 1/2 Neg Hx     Social History:  Social History   Tobacco Use   Smoking status: Never   Smokeless tobacco: Never  Vaping Use   Vaping status: Never Used  Substance Use Topics   Alcohol use: No   Drug use: No    ROS: Constitutional:  Negative for fever, chills, weight loss CV: Negative for chest pain,  previous MI, hypertension Respiratory:  Negative for shortness of breath, wheezing, sleep apnea, frequent cough GI:  Negative for nausea, vomiting, bloody stool, GERD  Physical exam: There were no vitals taken for this visit. GENERAL APPEARANCE:  Well appearing, well developed, well nourished, NAD HEENT:  Atraumatic, normocephalic, oropharynx clear NECK:  Supple without lymphadenopathy or thyromegaly ABDOMEN:  Soft, non-tender, no masses EXTREMITIES:  Moves all extremities well, without clubbing, cyanosis, or edema NEUROLOGIC:  Alert and oriented x 3, normal gait, CN II-XII grossly intact MENTAL STATUS:  appropriate BACK:  Non-tender to palpation, No CVAT SKIN:  Warm, dry, and intact  Results: U/A:  "
